# Patient Record
Sex: Female | Born: 1955
Health system: Southern US, Community
[De-identification: ages and names within clinical notes are randomized; demographics above are authoritative.]

## PROBLEM LIST (undated history)

## (undated) ENCOUNTER — Emergency Department (HOSPITAL_COMMUNITY): Admission: EM | Payer: Self-pay | Source: Home / Self Care

## (undated) DIAGNOSIS — B9689 Other specified bacterial agents as the cause of diseases classified elsewhere: Secondary | ICD-10-CM

## (undated) DIAGNOSIS — M1990 Unspecified osteoarthritis, unspecified site: Secondary | ICD-10-CM

## (undated) DIAGNOSIS — N760 Acute vaginitis: Secondary | ICD-10-CM

## (undated) DIAGNOSIS — I10 Essential (primary) hypertension: Secondary | ICD-10-CM

## (undated) HISTORY — DX: Other specified bacterial agents as the cause of diseases classified elsewhere: N76.0

## (undated) HISTORY — PX: PARTIAL HYSTERECTOMY: SHX80

## (undated) HISTORY — DX: Unspecified osteoarthritis, unspecified site: M19.90

## (undated) HISTORY — PX: BREAST EXCISIONAL BIOPSY: SUR124

## (undated) HISTORY — PX: ABDOMINAL HYSTERECTOMY: SHX81

## (undated) HISTORY — PX: POLYPECTOMY: SHX149

## (undated) HISTORY — DX: Other specified bacterial agents as the cause of diseases classified elsewhere: B96.89

---

## 1993-07-23 HISTORY — PX: ANTERIOR CRUCIATE LIGAMENT REPAIR: SHX115

## 1998-01-15 ENCOUNTER — Emergency Department (HOSPITAL_COMMUNITY): Admission: EM | Admit: 1998-01-15 | Discharge: 1998-01-15 | Payer: Self-pay | Admitting: Emergency Medicine

## 1998-02-25 ENCOUNTER — Encounter: Admission: RE | Admit: 1998-02-25 | Discharge: 1998-05-26 | Payer: Self-pay | Admitting: Orthopedic Surgery

## 1998-05-11 ENCOUNTER — Encounter: Admission: RE | Admit: 1998-05-11 | Discharge: 1998-08-09 | Payer: Self-pay | Admitting: Orthopedic Surgery

## 1998-06-14 ENCOUNTER — Encounter: Admission: RE | Admit: 1998-06-14 | Discharge: 1998-09-12 | Payer: Self-pay | Admitting: Orthopedic Surgery

## 1998-12-02 ENCOUNTER — Encounter: Admission: RE | Admit: 1998-12-02 | Discharge: 1998-12-02 | Payer: Self-pay | Admitting: Obstetrics & Gynecology

## 1998-12-02 ENCOUNTER — Other Ambulatory Visit: Admission: RE | Admit: 1998-12-02 | Discharge: 1998-12-02 | Payer: Self-pay | Admitting: Obstetrics & Gynecology

## 1998-12-26 ENCOUNTER — Ambulatory Visit (HOSPITAL_COMMUNITY): Admission: RE | Admit: 1998-12-26 | Discharge: 1998-12-26 | Payer: Self-pay | Admitting: Obstetrics & Gynecology

## 1999-02-07 ENCOUNTER — Ambulatory Visit (HOSPITAL_COMMUNITY): Admission: RE | Admit: 1999-02-07 | Discharge: 1999-02-07 | Payer: Self-pay | Admitting: *Deleted

## 1999-03-14 ENCOUNTER — Encounter: Admission: RE | Admit: 1999-03-14 | Discharge: 1999-03-14 | Payer: Self-pay | Admitting: Internal Medicine

## 1999-03-14 ENCOUNTER — Encounter: Admission: RE | Admit: 1999-03-14 | Discharge: 1999-03-14 | Payer: Self-pay | Admitting: Obstetrics & Gynecology

## 1999-03-17 ENCOUNTER — Ambulatory Visit (HOSPITAL_COMMUNITY): Admission: RE | Admit: 1999-03-17 | Discharge: 1999-03-17 | Payer: Self-pay | Admitting: *Deleted

## 1999-04-26 ENCOUNTER — Encounter: Admission: RE | Admit: 1999-04-26 | Discharge: 1999-04-26 | Payer: Self-pay | Admitting: Internal Medicine

## 2000-01-26 ENCOUNTER — Emergency Department (HOSPITAL_COMMUNITY): Admission: EM | Admit: 2000-01-26 | Discharge: 2000-01-26 | Payer: Self-pay | Admitting: Emergency Medicine

## 2004-04-24 ENCOUNTER — Emergency Department (HOSPITAL_COMMUNITY): Admission: EM | Admit: 2004-04-24 | Discharge: 2004-04-24 | Payer: Self-pay | Admitting: Plastic Surgery

## 2004-07-28 ENCOUNTER — Inpatient Hospital Stay (HOSPITAL_COMMUNITY): Admission: AD | Admit: 2004-07-28 | Discharge: 2004-07-28 | Payer: Self-pay | Admitting: Obstetrics & Gynecology

## 2004-07-31 ENCOUNTER — Encounter (INDEPENDENT_AMBULATORY_CARE_PROVIDER_SITE_OTHER): Payer: Self-pay | Admitting: Specialist

## 2004-07-31 ENCOUNTER — Observation Stay (HOSPITAL_COMMUNITY): Admission: AD | Admit: 2004-07-31 | Discharge: 2004-08-01 | Payer: Self-pay | Admitting: Obstetrics & Gynecology

## 2004-08-01 ENCOUNTER — Ambulatory Visit: Payer: Self-pay | Admitting: Obstetrics & Gynecology

## 2004-08-02 ENCOUNTER — Inpatient Hospital Stay (HOSPITAL_COMMUNITY): Admission: AD | Admit: 2004-08-02 | Discharge: 2004-08-02 | Payer: Self-pay | Admitting: Obstetrics and Gynecology

## 2004-08-15 ENCOUNTER — Ambulatory Visit: Payer: Self-pay | Admitting: Obstetrics & Gynecology

## 2004-08-22 ENCOUNTER — Ambulatory Visit: Payer: Self-pay | Admitting: Obstetrics & Gynecology

## 2004-08-22 ENCOUNTER — Inpatient Hospital Stay (HOSPITAL_COMMUNITY): Admission: AD | Admit: 2004-08-22 | Discharge: 2004-08-26 | Payer: Self-pay | Admitting: Obstetrics & Gynecology

## 2004-08-24 ENCOUNTER — Encounter (INDEPENDENT_AMBULATORY_CARE_PROVIDER_SITE_OTHER): Payer: Self-pay | Admitting: Specialist

## 2004-08-28 ENCOUNTER — Inpatient Hospital Stay (HOSPITAL_COMMUNITY): Admission: AD | Admit: 2004-08-28 | Discharge: 2004-08-28 | Payer: Self-pay | Admitting: Family Medicine

## 2004-09-12 ENCOUNTER — Ambulatory Visit: Payer: Self-pay | Admitting: Obstetrics and Gynecology

## 2004-09-26 ENCOUNTER — Ambulatory Visit: Payer: Self-pay | Admitting: Obstetrics & Gynecology

## 2004-10-03 ENCOUNTER — Ambulatory Visit (HOSPITAL_COMMUNITY): Admission: RE | Admit: 2004-10-03 | Discharge: 2004-10-03 | Payer: Self-pay | Admitting: Family Medicine

## 2004-10-17 ENCOUNTER — Encounter: Admission: RE | Admit: 2004-10-17 | Discharge: 2004-10-17 | Payer: Self-pay | Admitting: Family Medicine

## 2005-03-20 ENCOUNTER — Inpatient Hospital Stay (HOSPITAL_COMMUNITY): Admission: AD | Admit: 2005-03-20 | Discharge: 2005-03-20 | Payer: Self-pay | Admitting: *Deleted

## 2006-03-22 ENCOUNTER — Emergency Department (HOSPITAL_COMMUNITY): Admission: EM | Admit: 2006-03-22 | Discharge: 2006-03-22 | Payer: Self-pay | Admitting: *Deleted

## 2006-05-07 ENCOUNTER — Emergency Department (HOSPITAL_COMMUNITY): Admission: EM | Admit: 2006-05-07 | Discharge: 2006-05-07 | Payer: Self-pay | Admitting: Family Medicine

## 2006-12-02 ENCOUNTER — Emergency Department (HOSPITAL_COMMUNITY): Admission: EM | Admit: 2006-12-02 | Discharge: 2006-12-02 | Payer: Self-pay | Admitting: Emergency Medicine

## 2007-07-09 ENCOUNTER — Ambulatory Visit: Payer: Self-pay | Admitting: Internal Medicine

## 2007-09-02 ENCOUNTER — Ambulatory Visit: Payer: Self-pay | Admitting: Internal Medicine

## 2007-09-02 ENCOUNTER — Encounter: Payer: Self-pay | Admitting: Family Medicine

## 2007-09-02 LAB — CONVERTED CEMR LAB
ALT: 11 units/L (ref 0–35)
Albumin: 4.1 g/dL (ref 3.5–5.2)
Basophils Absolute: 0.1 10*3/uL (ref 0.0–0.1)
CO2: 24 meq/L (ref 19–32)
Calcium: 9 mg/dL (ref 8.4–10.5)
Chloride: 105 meq/L (ref 96–112)
Cholesterol: 144 mg/dL (ref 0–200)
Hemoglobin: 14.2 g/dL (ref 12.0–15.0)
Lymphocytes Relative: 27 % (ref 12–46)
Monocytes Absolute: 0.5 10*3/uL (ref 0.1–1.0)
Neutro Abs: 4.9 10*3/uL (ref 1.7–7.7)
Neutrophils Relative %: 60 % (ref 43–77)
Platelets: 228 10*3/uL (ref 150–400)
Potassium: 3.5 meq/L (ref 3.5–5.3)
RDW: 13.1 % (ref 11.5–15.5)
Sodium: 141 meq/L (ref 135–145)
Total Protein: 6.7 g/dL (ref 6.0–8.3)

## 2007-09-09 ENCOUNTER — Encounter: Admission: RE | Admit: 2007-09-09 | Discharge: 2007-09-09 | Payer: Self-pay | Admitting: Family Medicine

## 2007-09-23 ENCOUNTER — Encounter: Admission: RE | Admit: 2007-09-23 | Discharge: 2007-09-23 | Payer: Self-pay | Admitting: Family Medicine

## 2007-10-06 ENCOUNTER — Ambulatory Visit: Payer: Self-pay | Admitting: Internal Medicine

## 2007-10-08 ENCOUNTER — Ambulatory Visit: Payer: Self-pay | Admitting: *Deleted

## 2007-10-23 ENCOUNTER — Emergency Department (HOSPITAL_COMMUNITY): Admission: EM | Admit: 2007-10-23 | Discharge: 2007-10-23 | Payer: Self-pay | Admitting: Emergency Medicine

## 2007-11-07 ENCOUNTER — Ambulatory Visit (HOSPITAL_BASED_OUTPATIENT_CLINIC_OR_DEPARTMENT_OTHER): Admission: RE | Admit: 2007-11-07 | Discharge: 2007-11-07 | Payer: Self-pay | Admitting: Orthopedic Surgery

## 2007-11-20 ENCOUNTER — Encounter: Admission: RE | Admit: 2007-11-20 | Discharge: 2008-02-18 | Payer: Self-pay | Admitting: Orthopedic Surgery

## 2007-12-16 ENCOUNTER — Ambulatory Visit: Payer: Self-pay | Admitting: Internal Medicine

## 2008-01-01 ENCOUNTER — Ambulatory Visit: Payer: Self-pay | Admitting: Internal Medicine

## 2008-01-06 ENCOUNTER — Ambulatory Visit: Payer: Self-pay | Admitting: Internal Medicine

## 2008-05-25 ENCOUNTER — Ambulatory Visit: Payer: Self-pay | Admitting: Internal Medicine

## 2008-08-03 ENCOUNTER — Ambulatory Visit: Payer: Self-pay | Admitting: Internal Medicine

## 2008-09-10 ENCOUNTER — Encounter (INDEPENDENT_AMBULATORY_CARE_PROVIDER_SITE_OTHER): Payer: Self-pay | Admitting: Adult Health

## 2008-09-10 ENCOUNTER — Ambulatory Visit: Payer: Self-pay | Admitting: Internal Medicine

## 2008-09-10 LAB — CONVERTED CEMR LAB
ALT: 19 units/L (ref 0–35)
Albumin: 4.3 g/dL (ref 3.5–5.2)
BUN: 10 mg/dL (ref 6–23)
Basophils Absolute: 0 10*3/uL (ref 0.0–0.1)
CO2: 22 meq/L (ref 19–32)
Calcium: 9.1 mg/dL (ref 8.4–10.5)
Chlamydia, Swab/Urine, PCR: NEGATIVE
Chloride: 105 meq/L (ref 96–112)
Cholesterol: 169 mg/dL (ref 0–200)
Creatinine, Ser: 0.55 mg/dL (ref 0.40–1.20)
GC Probe Amp, Urine: NEGATIVE
Hemoglobin: 15 g/dL (ref 12.0–15.0)
Lymphocytes Relative: 25 % (ref 12–46)
Monocytes Absolute: 0.5 10*3/uL (ref 0.1–1.0)
Monocytes Relative: 6 % (ref 3–12)
Neutro Abs: 5.5 10*3/uL (ref 1.7–7.7)
Neutrophils Relative %: 67 % (ref 43–77)
Potassium: 3.6 meq/L (ref 3.5–5.3)
RBC: 5.2 M/uL — ABNORMAL HIGH (ref 3.87–5.11)
TSH: 0.593 microintl units/mL (ref 0.350–4.50)
Total CHOL/HDL Ratio: 3.7
Vit D, 25-Hydroxy: 53 ng/mL (ref 30–89)

## 2008-09-16 ENCOUNTER — Encounter (INDEPENDENT_AMBULATORY_CARE_PROVIDER_SITE_OTHER): Payer: Self-pay | Admitting: Adult Health

## 2008-09-16 LAB — CONVERTED CEMR LAB

## 2008-10-05 ENCOUNTER — Emergency Department (HOSPITAL_COMMUNITY): Admission: EM | Admit: 2008-10-05 | Discharge: 2008-10-05 | Payer: Self-pay | Admitting: Internal Medicine

## 2008-10-12 ENCOUNTER — Ambulatory Visit (HOSPITAL_COMMUNITY): Admission: RE | Admit: 2008-10-12 | Discharge: 2008-10-12 | Payer: Self-pay | Admitting: Internal Medicine

## 2008-11-09 ENCOUNTER — Ambulatory Visit: Payer: Self-pay | Admitting: Internal Medicine

## 2010-03-30 IMAGING — CR DG SHOULDER 2+V*L*
3 series · 3 of 3 positions shown · non-contrast
Comparison: None.

CLINICAL DATA: History given of shoulder pain for 1 month with no
history of trauma.

LEFT SHOULDER - 2+ VIEW

[w shoulder ap internal left]
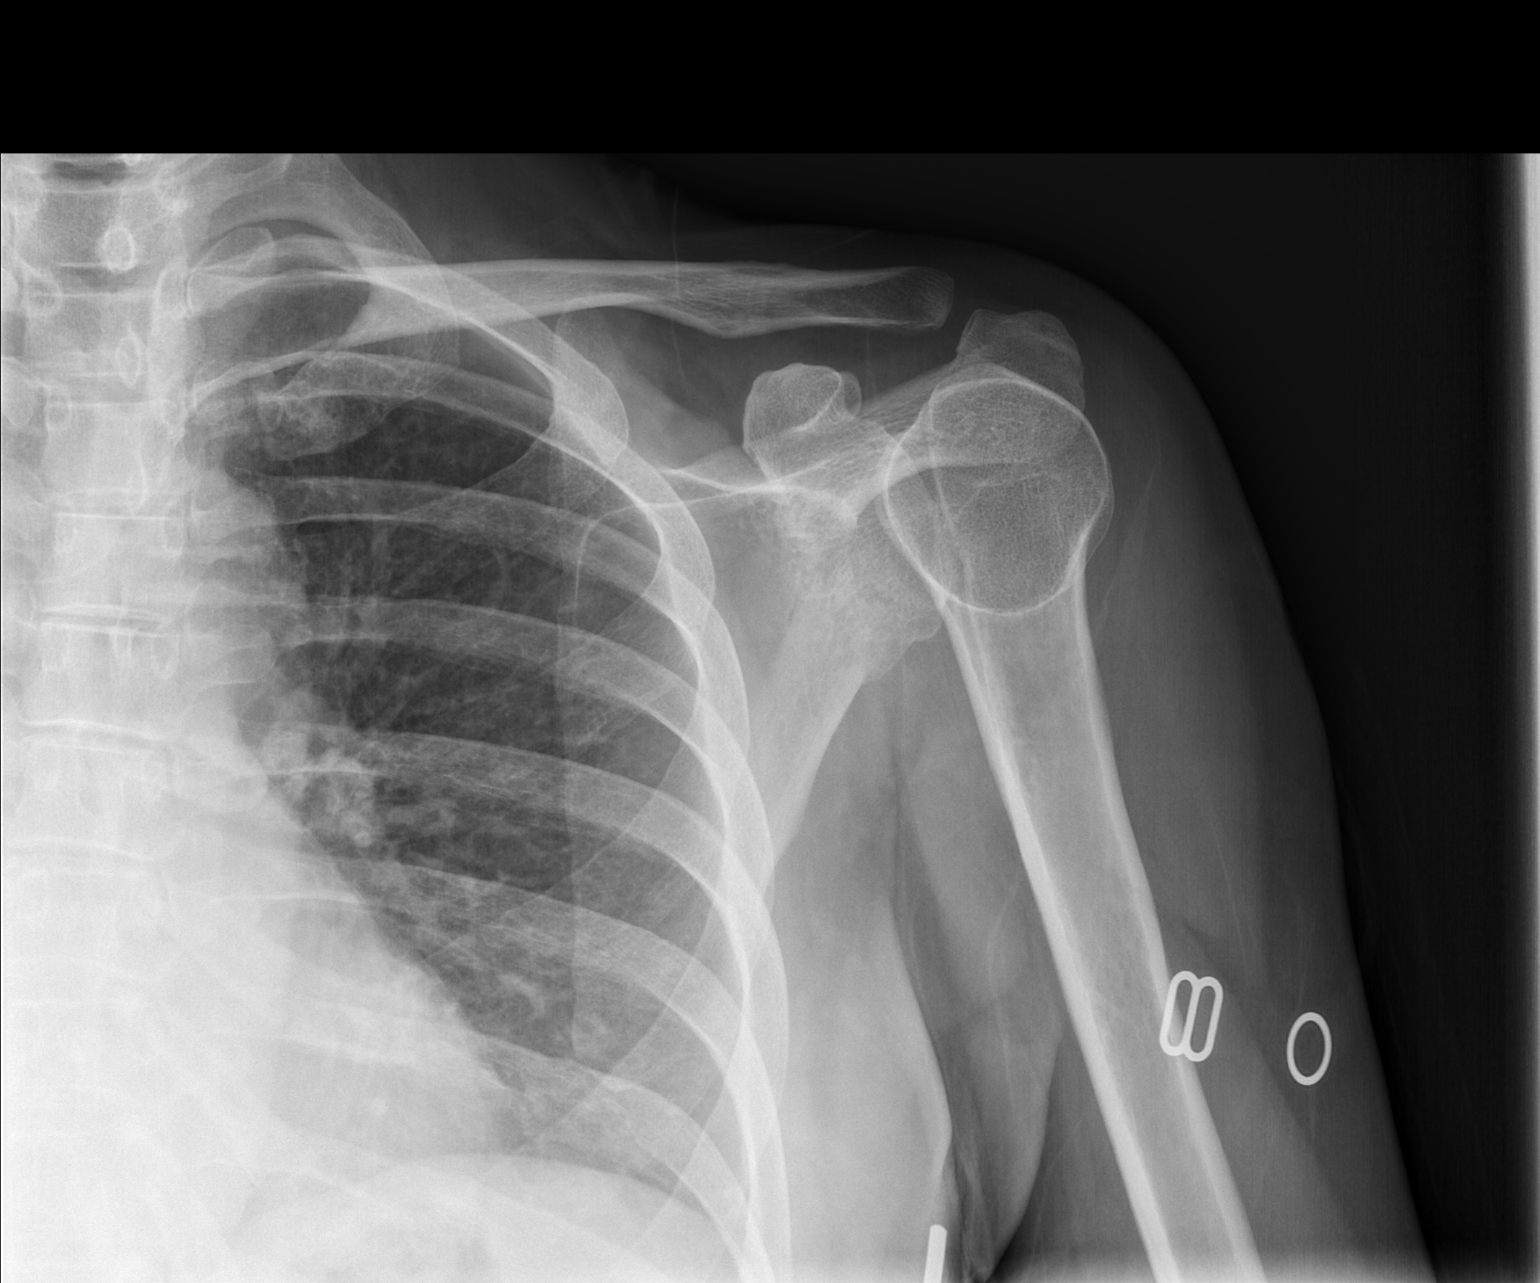

[w shoulder ap external left]
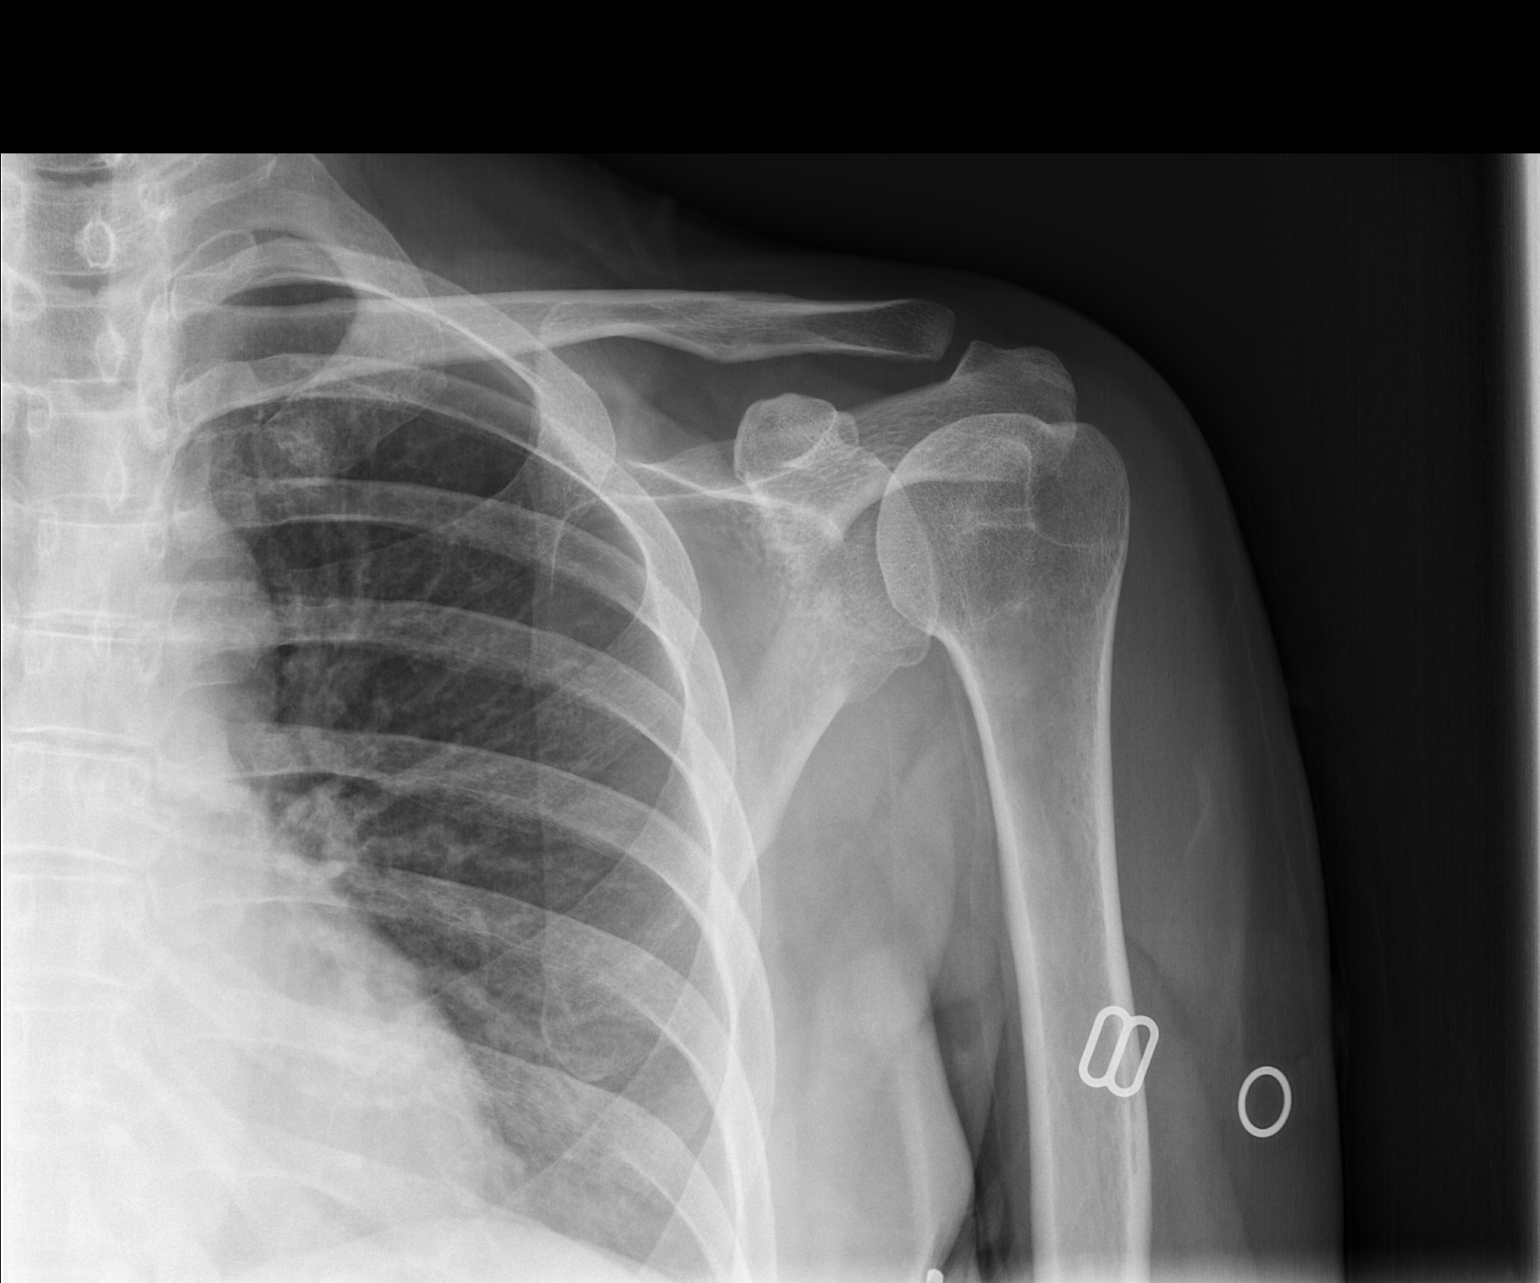

[w shoulder y view left]
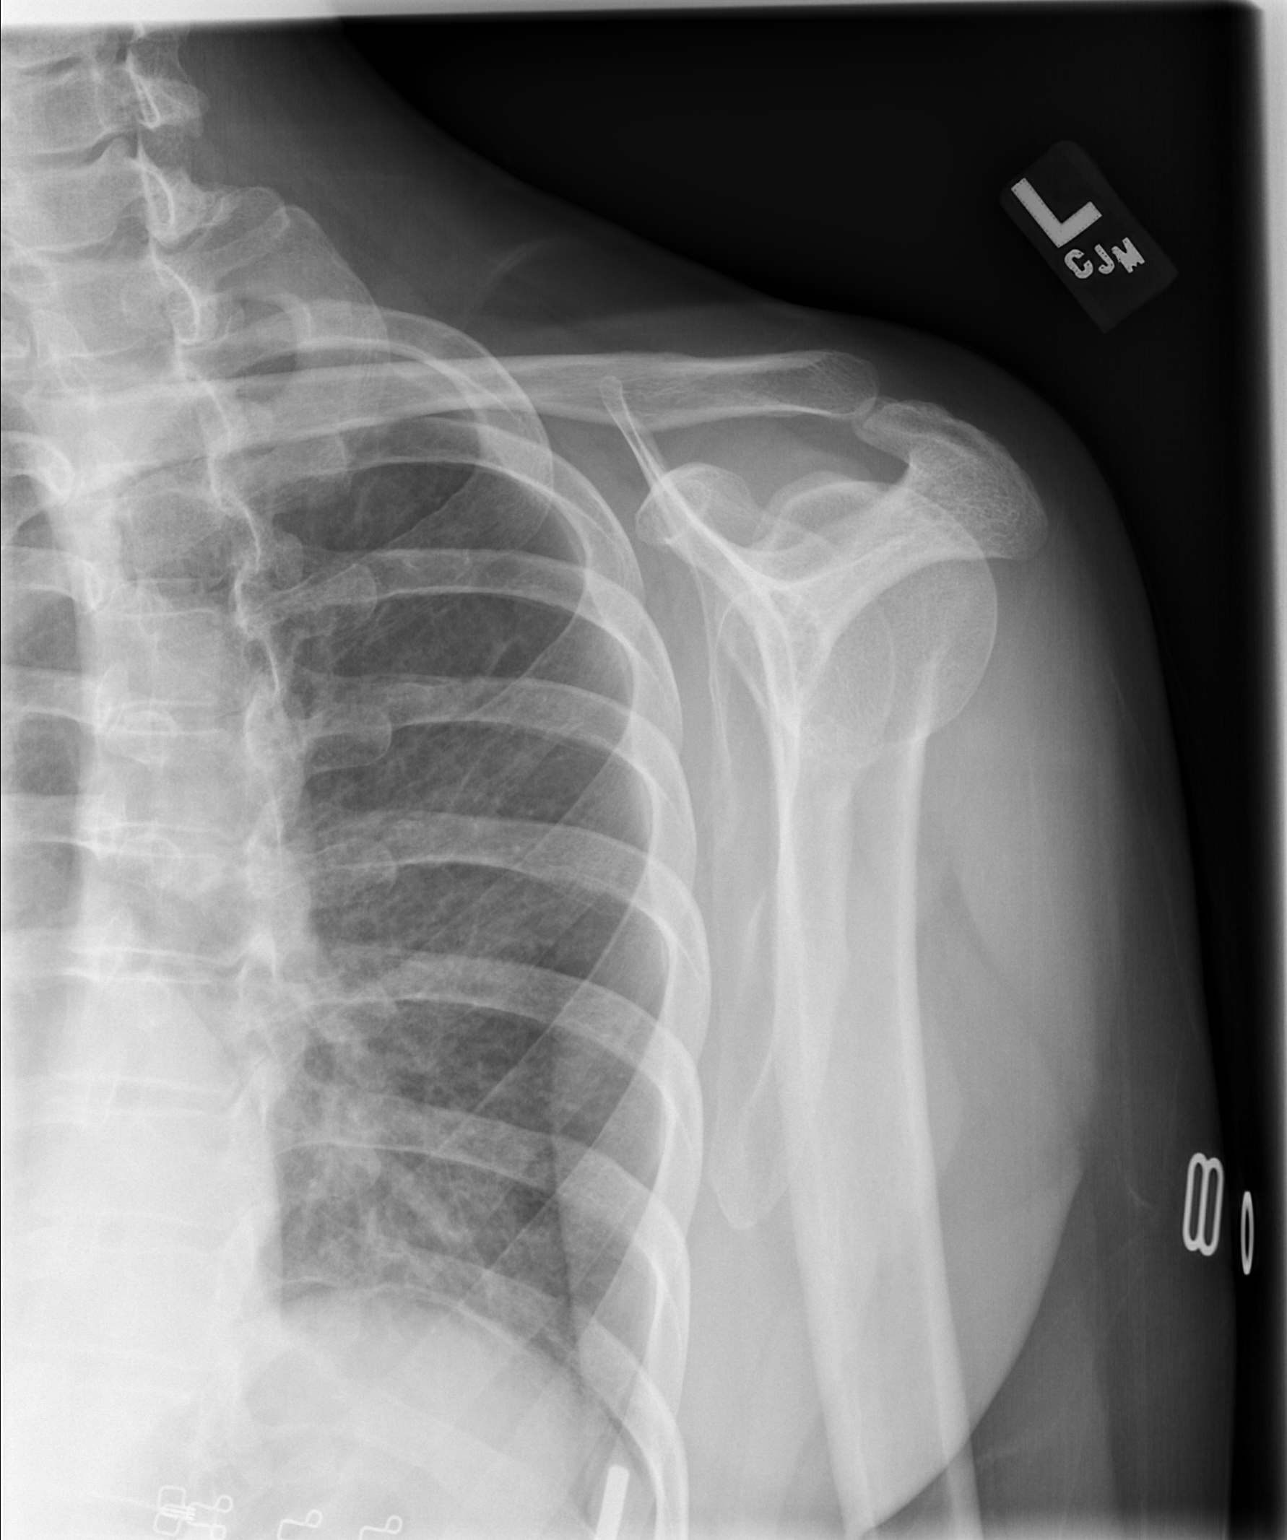

[3 of 3 positions shown; findings below may reference images not displayed]

FINDINGS: Alignment is normal.  Joint spaces are preserved.  No
fracture or dislocation is evident. No soft tissue lesion is seen.
IMPRESSION: No shoulder abnormality is identified.

## 2010-05-09 ENCOUNTER — Ambulatory Visit: Payer: Self-pay | Admitting: Internal Medicine

## 2010-05-23 ENCOUNTER — Ambulatory Visit (HOSPITAL_COMMUNITY): Admission: RE | Admit: 2010-05-23 | Discharge: 2010-05-23 | Payer: Self-pay | Admitting: Internal Medicine

## 2010-05-23 HISTORY — PX: COLONOSCOPY: SHX174

## 2010-06-06 ENCOUNTER — Encounter: Admission: RE | Admit: 2010-06-06 | Discharge: 2010-06-06 | Payer: Self-pay | Admitting: Internal Medicine

## 2010-12-05 NOTE — Op Note (Signed)
[name], [name]                ACCOUNT NO.:  [id number]   MEDICAL RECORD NO.:  [id number]          PATIENT TYPE:  AMB   LOCATION:  DSC                          FACILITY:  [facility]   PHYSICIAN:  Harvie Junior, M.D.   DATE OF BIRTH:  Jul 14, 1956   DATE OF PROCEDURE:  11/07/2007  DATE OF DISCHARGE:                               OPERATIVE REPORT   PREOPERATIVE DIAGNOSES:  Medial meniscal tear and anterior cruciate  ligament tear, right.   POSTOPERATIVE DIAGNOSES:  Medial meniscal tear and anterior cruciate  ligament tear, right.   PRINCIPAL PROCEDURE:  1. Right anterior cruciate ligament reconstruction with central one-      third patellar tendon allograft.  2. Posterior horn medial meniscal debridement of bucket-handle medial      meniscal tear.   SURGEON:  Harvie Junior, M.D.   ASSISTANT:  Marshia Ly, P.A.   ANESTHESIA:  General.   BRIEF HISTORY:  Shannon [name] is a 55 year old female with a long history  of having had significant right-knee pain and shifting and catching out  of place.  She most recently just had difficulty with falling with this  and, because of continued complaints of problems with instability and  pain, she came in to be evaluated.  Examination showed she had an  obvious pivot shift, Lachman and McMurray.  We talked about treatment  options, as well as we felt that ACL reconstruction would be the most  appropriate course of action, as well as debridement of the posterior  horn, medial meniscus.  She was brought to the operating room for this  procedure.   PROCEDURE:  Patient was brought to the operating room and, after  adequate anesthesia was obtained with a general anesthetic, patient was  placed upon the operating table.  Her right leg was prepped and draped  in the usual sterile fashion.  Following this, the leg was examined  under anesthesia and noted to have an obvious pivot shift, obvious  Lachman.  She was prepped and draped in the usual  sterile fashion and,  at this point, the routine arthroscopic examination revealed there was a  posteromedial bucket-handle tear.  This would come all the way out into  the knee.  This was released from the front, released from the back, and  a suction shaver was used to debride this back to a smooth and stable  rim.  Once this was completed, attention was turned to the notch, where  the old ACL was completely incompetent.  This was debrided and a  notchplasty was performed.  Lateral side was checked and noted to be  normal.  Patellofemoral joint looked normal at this point.   Attention was turned back to the notch, where after the notchplasty was  performed a tibial guide was used, placed through the medial portal, and  it was placed where it needed to be and a guide wire was advanced  through the tibia.  A small incision was made to dissect down to where  this guide wire went through the bone and, once this was completed, this  tunnel  was over-reamed to a 10.  The back portion of the tunnel was then  rasped and then the over-the-top guide was used and a tunnel was drilled  to 25 on the femoral side and a notch was placed in the tunnel.  Following this, the graft was placed in the knee, after all excessive  bone fragments had been removed from the knee.  Once the graft was  placed into the knee, it was locked in place with a 7 x 20 screw with a  sheath and the knee was then cycled 20 times.  There was excellent  isometry to the graft and tendency towards knee isometry.  At this  point, the graft was locked in on the tibial side with a 7 x 20 screw  with a sheath and, at this point, the leg came easily into full  extension with no impingement on the graft.  The knee was now Lachman-  negative and pivot-negative.   At this point, the knee was copiously and thoroughly irrigated and  suctioned dry.  The arthroscopic portals were closed with a bandage.  The distal portal was closed with a 0  and 2-0 Vicryl and a 3-0 Monocryl  subcuticular stitch.  Benzoin and Steri-Strips were applied.  Sterile  compressive dressing was applied and patient taken to recovery, where  she was noted to be in satisfactory condition.   Estimated blood loss for the procedure was none.      Harvie Junior, M.D.  Electronically Signed     JLG/MEDQ  D:  11/07/2007  T:  11/07/2007  Job:  [id number]

## 2010-12-08 NOTE — Discharge Summary (Signed)
[name], [name]                ACCOUNT NO.:  [id number]   MEDICAL RECORD NO.:  [id number]          PATIENT TYPE:  INP   LOCATION:  9305                          FACILITY:  [facility]   PHYSICIAN:  Lesly Dukes, M.D. DATE OF BIRTH:  03/05/1956   DATE OF ADMISSION:  07/31/2004  DATE OF DISCHARGE:  08/01/2004                                 DISCHARGE SUMMARY   ADMISSION DIAGNOSES:  44.  A 55 year old with menometrorrhagia.  2.  Anemia with a hemoglobin of 6.9.   DISCHARGE DIAGNOSES:  64.  A 55 year old with menometrorrhagia.  2.  Status post 2 units of packed red blood cells transfusion with an      appropriate rise in hemoglobin to 8.8.   DISCHARGE MEDICATIONS:  1.  Ovcon one p.o. daily.  2.  Iron sulfate 325 mg one p.o. b.i.d.  3.  Colace p.r.n.  4.  Ibuprofen 400-600 mg q.6h. p.r.n. cramping.   HISTORY OF PRESENT ILLNESS:  [name] was admitted with vaginal bleeding  and anemia.  She was admitted for a transfusion and stabilization.   HOSPITAL COURSE:  Her hospital course was fairly uncomplicated.  She  received 2 units of packed cells, with an appropriate rise in her hemoglobin  from 6.9 to 8.8.  She was given an injection of Lupron, 11.25 mg, prior to  discharge and was placed on iron and was told to continue her Ovcon.   CONDITION ON DISCHARGE:  The patient is discharged to home in stable  condition.   DISCHARGE INSTRUCTIONS:  The patient was told of her above medical regimen  and to follow up with the GYN clinic in three to four weeks.  She was to  return if the bleeding becomes more severe or if she becomes symptomatic  from her anemia.      LC/MEDQ  D:  08/01/2004  T:  08/01/2004  Job:  [id number]   cc:   Lesly Dukes, M.D.

## 2010-12-08 NOTE — Group Therapy Note (Signed)
NAMEIVIONA, HOLE                ACCOUNT NO.:  [id number]   MEDICAL RECORD NO.:  [id number]          PATIENT TYPE:  WOC   LOCATION:  [facility]                   FACILITY:  [facility]   PHYSICIAN:  Elsie Lincoln, MD      DATE OF BIRTH:  [birth year]   DATE OF SERVICE:  09/26/2004                                    CLINIC NOTE   Patient is a 55 year old female status post TAH on August 24, 2004.  Pathology showed a benign endometrial polyp with squamous metaplasia on the  cervix and adenomyosis.  The patient today has no complaints, a little bit  of tenderness around the incision.  She is not sexually active yet.  On the  21st of February, she was ruled out for bacterial vaginosis.  However, she  still feels that some type of discharge.  A wet prep was done today.  Results will be back tomorrow.  The patient has a small amount of  physiologic discharge.  Patient has had a mammogram, she does not know when  so we will set her up for a mammogram with a mammogram scholarship.  Patient  also has never had a history of dysplasia so she will no longer need Pap  smears.   PHYSICAL EXAMINATION:  ABDOMEN:  Soft, nontender, nondistended.  Incision  well healed.  PELVIC:  Genitalia Tanner V.  Vagina pink, normal rugae, small amount of  discharge.  Vaginal cuff well healed.  No evidence of granulation tissue or  infection.  On bimanual uterus is not present.  There are no masses and is  nontender.   ASSESSMENT/PLAN:  A 55 year old female status post total abdominal  hysterectomy approximately five weeks ago who is doing well.  1.  Wet prep for discharge.  2.  Mammogram ordered.  3.  Return to clinic in a year.      KL/MEDQ  D:  09/26/2004  T:  09/26/2004  Job:  [id number]

## 2010-12-08 NOTE — Discharge Summary (Signed)
Shannon [name], Shannon [name]                ACCOUNT NO.:  [id number]   MEDICAL RECORD NO.:  [id number]          PATIENT TYPE:  INP   LOCATION:  9318                          FACILITY[facility]   PHYSICIAN:  Lesly Dukes, M.D. DATE OF BIRTH:  06-05-1956   DATE OF ADMISSION:  08/22/2004  DATE OF DISCHARGE:  08/26/2004                                 DISCHARGE SUMMARY   The patient is a 55 year old female with a history of menorrhagia and  fibroids, who was admitted from gyn clinic on January 31.  The patient had a  continuous bleed despite receiving Depot-Lupron and an __________taper.  The  patient's current hemoglobin was 9.8.  Given that she has been unresponsive  to all medical interventions, the patient desires hysterectomy.  She was  taken for hysterectomy on February 2, had a total abdominal hysterectomy.  It was noted that her fallopian tubes were surgically absent.  Her ovaries  were normal, and her uterus was enlarged and boggy consistent with  adenomyosis.  EBL of the operation was 250 mL.  The patient did well  postoperatively.  She never had a fever, ambulated well, and tolerated solid  food, had flatus and voided easily.  She was discharged home with normal  postoperative instructions.  These include temps twice a day and is to call  for any temperature greater than or equal to 100.4 degrees F.  She is  supposed to do no heavy lifting and have vaginal rest for 6 weeks.   DISCHARGE MEDICATIONS:  1.  Ferrous sulfate 325 mg p.o. b.i.d.  2.  Colace 100 mg p.o. b.i.d.  3.  Vicodin 1-2 tabs p.o. q.4-6h. p.r.n. pain.   Follow up appointment February 6 for staple removal and an incision check at  my clinic on February 14.      KHL/MEDQ  D:  10/02/2004  T:  10/02/2004  Job:  [id number]

## 2010-12-08 NOTE — Op Note (Signed)
NAMESHILAH, HEFEL                ACCOUNT NO.:  [id number]   MEDICAL RECORD NO.:  [id number]          PATIENT TYPE:  INP   LOCATION:  9318                          FACILITY[facility]   PHYSICIAN:  Lesly Dukes, M.D. DATE OF BIRTH:  [birth year]   DATE OF PROCEDURE:  08/24/2004  DATE OF DISCHARGE:                                 OPERATIVE REPORT   PREOPERATIVE DIAGNOSIS:  A 55 year old female with menometrorrhagia  refractory to medications.   POSTOPERATIVE DIAGNOSIS:  A 55 year old female with menometrorrhagia  refractory to medications.   PROCEDURE:  Total abdominal hysterectomy.   SURGEON:  Lesly Dukes, M.D.   ASSISTANTMichele Mcalpine D. Rose, M.D.   ESTIMATED BLOOD LOSS:  250 mL.   ANESTHESIA:  General.   PATHOLOGY:  Uterus and cervix.   FINDINGS:  A slightly enlarged, boggy uterus.  Surgically absent fallopian  tubes.  Grossly normal small ovaries.  Grossly normal appendix, a moderate  amount of adhesions involving the bladder.   PROCEDURE:  After informed consent was obtained, the patient was taken to  the operating room, where general anesthesia was induced.  The patient was  placed in dorsal lithotomy position and prepared and draped in the normal  sterile fashion.  A Foley was placed in the bladder.  A Pfannenstiel skin  incision was made with a scalpel and carried down to the underlying layer of  fascia.  The fascia was incised in the midline and the incision extended  bilaterally with the Mayo scissors.  The superior and inferior aspect of the  fascial incision was grasped with Kocher clamps, tented up, dissected down  sharply and entered bluntly.  Underlying layers of the rectus muscles were  separated in the midline and the peritoneum identified and entered bluntly.  The incision was extended both superiorly and inferiorly with good  visualization of the bladder.  A survey of the abdominal contents was  performed.  A Balfour retractor was placed to the abdomen and  the uterus was  brought to the surgical field with [facility] on the cornua.  The round  ligaments were identified, clamped, transected and suture ligated with 0  Vicryl.  The utero-ovarian ligaments were then identified, doubly clamped,  transected and suture ligated with 3-0 Vicryl.  Good hemostasis was noted to  the pedicles.  The bladder flap was created using the Metzenbaum scissors.  The uterine arteries were then skeletonized, doubly clamped with curved  Heaneys, transected and suture ligated with 0 Vicryl.  Using a straight  Heaney, the uterosacral, cardinal ligaments and cervix were clamped,  transected and suture ligated with 0 Vicryl.  At the end of the cervix, the  vagina was entered and the Jorgenson scissors were used to transect the  vagina.  The specimen was then removed and sent off to pathology.  The  vaginal cuff was grabbed with Allises and the angles were sutured with 0  Vicryl.  Good hemostasis was noted.  The cuff was sutured with 0 Vicryl in a  running fashion and good hemostasis was noted as well.  The abdomen was  copiously irrigated and all pedicles were noted to be hemostatic.  The  peritoneum and rectus muscles were noted to be hemostatic.  The fascia was  then closed with 0 Vicryl in running fashion and noted to be hemostatic.  The subcutaneous tissue was copiously irrigated and found to  be hemostatic.  The skin was closed with staples and a dressing was placed  on the abdomen.  The patient tolerated the procedure well.  The sponge, lap,  needle and instrument count were correct x2.  The patient went to the  recovery room in stable condition.      KHL/MEDQ  D:  08/24/2004  T:  08/24/2004  Job:  [id number]

## 2010-12-08 NOTE — Group Therapy Note (Signed)
Shannon [name], Shannon [name]                ACCOUNT NO.:  [id number]   MEDICAL RECORD NO.:  [id number]          PATIENT TYPE:  WOC   LOCATION:  [facility]                   FACILITY:  [facility]   PHYSICIAN:  Elsie Lincoln, MD      DATE OF BIRTH:  Jun 11, 1956   DATE OF SERVICE:  08/15/2004                                    CLINIC NOTE   CHIEF COMPLAINT:  Follow-up menometrorrhagia and anemia.   SUBJECTIVE:  Shannon [name] is a 55 year old white female admitted earlier this  month with menometrorrhagia and anemia.  Her hemoglobin had dropped to 6.6  and she required blood transfusion.  She was given a Depo-Lupron shot at  that time and told to follow up today.  In addition, she had been taking  Ovcon two pills twice a day for a week and then down to one pill each day.  She continues on these.  In addition, she is taking iron three times a day.  She reports that her vaginal bleeding has decreased considerably but she is  still using two pads each day.  Over the weekend it was a little more  excessive at three pads daily.  She does report a little bit of  lightheadedness today as well.   OBJECTIVE:  VITAL SIGNS:  As noted in the chart.  Orthostatics were obtained  today.  Standing revealed a blood pressure 124/62, heart rate 64.  Sitting  revealed a blood pressure 122/60, heart rate of 60.  Supine revealed blood  pressure 124/70, heart rate of 56.  GENERAL:  Pleasant, alert, in no acute distress.   Detailed physical examination not done today.   ASSESSMENT/PLAN:  Menometrorrhagia with anemia.  Patient is now about two  weeks into the Lupron therapy.  She is down to about two pads each day.  Will continue her oral contraceptives for about one more week and then have  her discontinue these.  She will take her iron supplements three times each  day.  She will come back in about six weeks for reevaluation for  preoperative hysterectomy.  She is to follow up earlier if increasing  vaginal bleeding or  symptoms of anemia.      AK/MEDQ  D:  08/15/2004  T:  08/16/2004  Job:  [id number]

## 2010-12-08 NOTE — Discharge Summary (Signed)
NAMESHAKYA, Shannon [name]                ACCOUNT NO.:  [id number]   MEDICAL RECORD NO.:  [id number]          PATIENT TYPE:  INP   LOCATION:  9318                          [facility]ILITY[facility]   PHYSICIAN:  Lesly Dukes, M.D. DATE OF BIRTH:  1956/02/09   DATE OF ADMISSION:  08/22/2004  DATE OF DISCHARGE:  08/26/2004                                 DISCHARGE SUMMARY   HOSPITAL COURSE:  The patient is a 55 year old female who is admitted on  August 15, 2004 for a scheduled TAH secondary to menorrhagia and probable  adenomyosis. The patient had been admitted earlier in January and received 2  units of packed red blood cells and Lupron. This did not stop her bleeding  which is consistent with adenomyosis. She had a normal Pap smear and a  negative endometrial biopsy. The patient underwent abdominal hysterectomy  without complications.   Dictation ended at this point.      KHL/MEDQ  D:  10/02/2004  T:  10/02/2004  Job:  [id number]

## 2011-04-17 LAB — POCT HEMOGLOBIN-HEMACUE: Hemoglobin: 14.9

## 2011-12-17 ENCOUNTER — Ambulatory Visit: Payer: Self-pay | Admitting: Family Medicine

## 2011-12-17 VITALS — BP 148/100 | HR 67 | Temp 98.3°F | Resp 16 | Ht 64.5 in | Wt 159.4 lb

## 2011-12-17 DIAGNOSIS — H6090 Unspecified otitis externa, unspecified ear: Secondary | ICD-10-CM

## 2011-12-17 DIAGNOSIS — H9209 Otalgia, unspecified ear: Secondary | ICD-10-CM

## 2011-12-17 DIAGNOSIS — H60399 Other infective otitis externa, unspecified ear: Secondary | ICD-10-CM

## 2011-12-17 MED ORDER — CEPHALEXIN 500 MG PO CAPS: 500.0000 mg | ORAL_CAPSULE | Freq: Three times a day (TID) | ORAL | Status: AC

## 2011-12-17 MED ORDER — TRIAMCINOLONE ACETONIDE 0.1 % EX CREA: TOPICAL_CREAM | Freq: Two times a day (BID) | CUTANEOUS | Status: AC

## 2011-12-17 MED ORDER — NEOMYCIN-POLYMYXIN-HC 3.5-10000-1 OT SOLN: 3.0000 [drp] | Freq: Four times a day (QID) | OTIC | Status: AC

## 2011-12-17 NOTE — Progress Notes (Signed)
  Subjective:    Patient ID: Shannon [name], female    DOB: [birth year], 56 y.o.   MRN: [id number]  HPI 56 yo female with ear pain.  Describes trouble with itching and recurrent swimmers ear all her life.  This time for several days.  Left ear.  Pain, itching, redness, swelling.  Has tried hydrocortisone cream on the eczema but doesn't help much.    Review of Systems Negative except as per HPI     Objective:   Physical Exam  Constitutional: She appears well-developed.  HENT:       Right canal swollen and with white debris. Left ear - pinna red, swollen with flaky skin.  Canal swollen and with white debris.   Pulmonary/Chest: Effort normal.  Neurological: She is alert.          Assessment & Plan:  Otitis externa Eczema  Cortisporin otic, keflex, TAC cream for the eczema.

## 2014-09-20 ENCOUNTER — Ambulatory Visit: Payer: Self-pay | Attending: Internal Medicine

## 2014-10-11 ENCOUNTER — Encounter: Payer: Self-pay | Admitting: Internal Medicine

## 2014-10-11 ENCOUNTER — Ambulatory Visit (HOSPITAL_COMMUNITY)
Admission: RE | Admit: 2014-10-11 | Discharge: 2014-10-11 | Disposition: A | Discharge status: Home / Self Care | Payer: Self-pay | Source: Ambulatory Visit | Attending: Internal Medicine | Admitting: Internal Medicine

## 2014-10-11 ENCOUNTER — Ambulatory Visit: Payer: Self-pay | Attending: Internal Medicine | Admitting: Internal Medicine

## 2014-10-11 VITALS — BP 151/94 | HR 68 | Temp 98.0°F | Resp 16 | Wt 165.6 lb

## 2014-10-11 DIAGNOSIS — H6120 Impacted cerumen, unspecified ear: Secondary | ICD-10-CM | POA: Insufficient documentation

## 2014-10-11 DIAGNOSIS — M25561 Pain in right knee: Secondary | ICD-10-CM

## 2014-10-11 DIAGNOSIS — Z9181 History of falling: Secondary | ICD-10-CM | POA: Insufficient documentation

## 2014-10-11 DIAGNOSIS — I10 Essential (primary) hypertension: Secondary | ICD-10-CM | POA: Insufficient documentation

## 2014-10-11 DIAGNOSIS — H00016 Hordeolum externum left eye, unspecified eyelid: Secondary | ICD-10-CM | POA: Insufficient documentation

## 2014-10-11 DIAGNOSIS — G8929 Other chronic pain: Secondary | ICD-10-CM | POA: Insufficient documentation

## 2014-10-11 DIAGNOSIS — Z9889 Other specified postprocedural states: Secondary | ICD-10-CM

## 2014-10-11 DIAGNOSIS — M25569 Pain in unspecified knee: Secondary | ICD-10-CM

## 2014-10-11 DIAGNOSIS — M179 Osteoarthritis of knee, unspecified: Secondary | ICD-10-CM | POA: Insufficient documentation

## 2014-10-11 DIAGNOSIS — R030 Elevated blood-pressure reading, without diagnosis of hypertension: Secondary | ICD-10-CM

## 2014-10-11 DIAGNOSIS — IMO0001 Reserved for inherently not codable concepts without codable children: Secondary | ICD-10-CM

## 2014-10-11 DIAGNOSIS — H6092 Unspecified otitis externa, left ear: Secondary | ICD-10-CM | POA: Insufficient documentation

## 2014-10-11 DIAGNOSIS — L309 Dermatitis, unspecified: Secondary | ICD-10-CM | POA: Insufficient documentation

## 2014-10-11 DIAGNOSIS — Z139 Encounter for screening, unspecified: Secondary | ICD-10-CM | POA: Insufficient documentation

## 2014-10-11 DIAGNOSIS — Z1211 Encounter for screening for malignant neoplasm of colon: Secondary | ICD-10-CM

## 2014-10-11 LAB — COMPLETE METABOLIC PANEL WITH GFR
ALK PHOS: 96 U/L (ref 39–117)
ALT: 22 U/L (ref 0–35)
AST: 18 U/L (ref 0–37)
Albumin: 4.1 g/dL (ref 3.5–5.2)
BILIRUBIN TOTAL: 0.4 mg/dL (ref 0.2–1.2)
BUN: 11 mg/dL (ref 6–23)
CO2: 30 meq/L (ref 19–32)
CREATININE: 0.72 mg/dL (ref 0.50–1.10)
Calcium: 9.6 mg/dL (ref 8.4–10.5)
Chloride: 104 mEq/L (ref 96–112)
GFR, Est African American: >89 mL/min
GFR, Est Non African American: >89 mL/min
GLUCOSE: 90 mg/dL (ref 70–99)
POTASSIUM: 3.9 meq/L (ref 3.5–5.3)
SODIUM: 141 meq/L (ref 135–145)
Total Protein: 6.7 g/dL (ref 6.0–8.3)

## 2014-10-11 LAB — CBC WITH DIFFERENTIAL/PLATELET
BASOS PCT: 1 % (ref 0–1)
Basophils Absolute: 0.1 10*3/uL (ref 0.0–0.1)
EOS ABS: 0.2 10*3/uL (ref 0.0–0.7)
EOS PCT: 3 % (ref 0–5)
HCT: 40.6 % (ref 36.0–46.0)
Hemoglobin: 13.6 g/dL (ref 12.0–15.0)
Lymphocytes Relative: 38 % (ref 12–46)
Lymphs Abs: 2.5 10*3/uL (ref 0.7–4.0)
MCH: 28.6 pg (ref 26.0–34.0)
MCHC: 33.5 g/dL (ref 30.0–36.0)
MCV: 85.5 fL (ref 78.0–100.0)
MONO ABS: 0.5 10*3/uL (ref 0.1–1.0)
MONOS PCT: 7 % (ref 3–12)
MPV: 9.5 fL (ref 8.6–12.4)
Neutro Abs: 3.4 10*3/uL (ref 1.7–7.7)
Neutrophils Relative %: 51 % (ref 43–77)
PLATELETS: 213 10*3/uL (ref 150–400)
RBC: 4.75 MIL/uL (ref 3.87–5.11)
RDW: 13.8 % (ref 11.5–15.5)
WBC: 6.7 10*3/uL (ref 4.0–10.5)

## 2014-10-11 LAB — TSH: TSH: 0.547 u[IU]/mL (ref 0.350–4.500)

## 2014-10-11 MED ORDER — BACITRACIN-POLYMYXIN B 500-10000 UNIT/GM OP OINT: 1.0000 "application " | TOPICAL_OINTMENT | Freq: Two times a day (BID) | OPHTHALMIC | Status: DC

## 2014-10-11 MED ORDER — CIPROFLOXACIN-DEXAMETHASONE 0.3-0.1 % OT SUSP: 4.0000 [drp] | Freq: Two times a day (BID) | OTIC | Status: DC

## 2014-10-11 MED ORDER — CARBAMIDE PEROXIDE 6.5 % OT SOLN: 5.0000 [drp] | Freq: Two times a day (BID) | OTIC | Status: DC

## 2014-10-11 MED ORDER — HYDROCORTISONE 2.5 % EX CREA: TOPICAL_CREAM | Freq: Two times a day (BID) | CUTANEOUS | Status: DC

## 2014-10-11 NOTE — Progress Notes (Signed)
Patient here to establish care Patient states she injured her right knee years ago Had a fall this past year and injured the knee again and now having Pain Has itching to both ears that is so bad at times she make herself bleed Also has a sty to the top lid of her left eye

## 2014-10-11 NOTE — Progress Notes (Signed)
Patient Demographics  Shannon [name], is a 59 y.o. female  OJJ:[id number]  HBZ:[id number]  DOB - [birth year]  CC:  Chief Complaint  Patient presents with  . Establish Care       HPI: Shannon [name] is a 59 y.o. female here today to establish medical care.patient has history of chronic right knee pain, had a surgery done several years ago, patient reported few months she fell on ice and had some worsening symptoms, she takes OTC Aleve/ibuprofen when necessary for pain, patient denies any fever chills has been having problem with the use for several years, has been having left ear pain denies any discharge patient has been using Q-tip, she also reported to have eczema in the ears, was prescribed cream in the past she is also complaining of stye in her left eye. Denies any discharge. Patient has No headache, No chest pain, No abdominal pain - No Nausea, No new weakness tingling or numbness, No Cough - SOB.  No Known Allergies History reviewed. No pertinent past medical history. No current outpatient prescriptions on file prior to visit.   No current facility-administered medications on file prior to visit.   Family History  Problem Relation Age of Onset  . Cancer Father     colon cancer    History   Social History  . Marital Status: Single    Spouse Name: N/A  . Number of Children: N/A  . Years of Education: N/A   Occupational History  . Not on file.   Social History Main Topics  . Smoking status: Former Smoker -- 20 years    Quit date: 08/19/2011  . Smokeless tobacco: Not on file  . Alcohol Use: Not on file     Comment: socially   . Drug Use: Not on file  . Sexual Activity: Not on file   Other Topics Concern  . Not on file   Social History Narrative    Review of Systems: Constitutional: Negative for fever, chills, diaphoresis, activity change, appetite change and fatigue. HENT: Negative for ear pain, nosebleeds, congestion, facial swelling, rhinorrhea, neck  pain, neck stiffness and ear discharge.  Eyes: Negative for pain, discharge, redness, itching and visual disturbance. Respiratory: Negative for cough, choking, chest tightness, shortness of breath, wheezing and stridor.  Cardiovascular: Negative for chest pain, palpitations and leg swelling. Gastrointestinal: Negative for abdominal distention. Genitourinary: Negative for dysuria, urgency, frequency, hematuria, flank pain, decreased urine volume, difficulty urinating and dyspareunia.  Musculoskeletal: Negative for back pain, joint swelling, arthralgia and gait problem. Neurological: Negative for dizziness, tremors, seizures, syncope, facial asymmetry, speech difficulty, weakness, light-headedness, numbness and headaches.  Hematological: Negative for adenopathy. Does not bruise/bleed easily. Psychiatric/Behavioral: Negative for hallucinations, behavioral problems, confusion, dysphoric mood, decreased concentration and agitation.    Objective:   Filed Vitals:   10/11/14 1411  BP: 151/94  Pulse: 68  Temp: 98 F (36.7 C)  Resp: 16    Physical Exam: Constitutional: Patient appears well-developed and well-nourished. No distress. HENT: Normocephalic, atraumatic, External right and left ear normal. Oropharynx is clear and moist.increased wax in both ears, tenderness with palpation on left ear tragus . Eyes: Conjunctivae and EOM are normal. PERRLA, no scleral icterus. Neck: Normal ROM. Neck supple. No JVD. No tracheal deviation. No thyromegaly. CVS: RRR, S1/S2 +, no murmurs, no gallops, no carotid bruit.  Pulmonary: Effort and breath sounds normal, no stridor, rhonchi, wheezes, rales.  Abdominal: Soft. BS +, no distension, tenderness, rebound or guarding.  Musculoskeletal: Normal range of motion.  Right knee minimal swelling/tenderness Neuro: Alert. Normal reflexes, muscle tone coordination. No cranial nerve deficit. Skin: Skin is warm and dry. No rash noted. Not diaphoretic. No erythema. No  pallor. Psychiatric: Normal mood and affect. Behavior, judgment, thought content normal.  Lab Results  Component Value Date   WBC 8.2 09/10/2008   HGB 15.0 09/10/2008   HCT 46.3* 09/10/2008   MCV 89.0 09/10/2008   PLT 228 09/10/2008   Lab Results  Component Value Date   CREATININE 0.55 09/10/2008   BUN 10 09/10/2008   NA 140 09/10/2008   K 3.6 09/10/2008   CL 105 09/10/2008   CO2 22 09/10/2008    No results found for: HGBA1C Lipid Panel     Component Value Date/Time   CHOL 169 09/10/2008 2037   TRIG 68 09/10/2008 2037   HDL 46 09/10/2008 2037   CHOLHDL 3.7 Ratio 09/10/2008 2037   VLDL 14 09/10/2008 2037   LDLCALC 109* 09/10/2008 2037       Assessment and plan:   1. Chronic knee pain, right/. History of right knee surgery  - DG Knee Complete 4 Views Right; Future  2. Elevated BP Advise patient for DASH diet  3. Eczema  - hydrocortisone 2.5 % cream; Apply topically 2 (two) times daily.  Dispense: 30 g; Refill: 0  4. Otitis externa, left  - ciprofloxacin-dexamethasone (CIPRODEX) otic suspension; Place 4 drops into both ears 2 (two) times daily.  Dispense: 7.5 mL; Refill: 0  5. Stye, left Advised patient to apply warm compress - bacitracin-polymyxin b (POLYSPORIN) ophthalmic ointment; Place 1 application into both eyes every 12 (twelve) hours. apply to eye every 12 hours while awake  Dispense: 3.5 g; Refill: 0  7. Excess ear wax, unspecified laterality  - carbamide peroxide (DEBROX) 6.5 % otic solution; Place 5 drops into both ears 2 (two) times daily.  Dispense: 15 mL; Refill: 1  8. Screening Ordered baseline blood work  - CBC with Differential/Platelet - COMPLETE METABOLIC PANEL WITH GFR - TSH - Vit D  25 hydroxy (rtn osteoporosis monitoring) - Hemoglobin A1c  9. Special screening for malignant neoplasms, colon  - Ambulatory referral to Gastroenterology   Health Maintenance -Colonoscopy:referred to GI -Pap Smear: -Mammogram:Patient reported  that she had a mammogram done 2 months ago   Return in about 3 months (around 01/11/2015), or if symptoms worsen or fail to improve.    The patient was given clear instructions to go to ER or return to medical center if symptoms don't improve, worsen or new problems develop. The patient verbalized understanding. The patient was told to call to get lab results if they haven't heard anything in the next week.    This note has been created with Surveyor, quantity. Any transcriptional errors are unintentional.   Lorayne Marek, MD

## 2014-10-11 NOTE — Patient Instructions (Signed)
DASH Eating Plan °DASH stands for "Dietary Approaches to Stop Hypertension." The DASH eating plan is a healthy eating plan that has been shown to reduce high blood pressure (hypertension). Additional health benefits may include reducing the risk of type 2 diabetes mellitus, heart disease, and stroke. The DASH eating plan may also help with weight loss. °WHAT DO I NEED TO KNOW ABOUT THE DASH EATING PLAN? °For the DASH eating plan, you will follow these general guidelines: °· Choose foods with a percent daily value for sodium of less than 5% (as listed on the food label). °· Use salt-free seasonings or herbs instead of table salt or sea salt. °· Check with your health care provider or pharmacist before using salt substitutes. °· Eat lower-sodium products, often labeled as "lower sodium" or "no salt added." °· Eat fresh foods. °· Eat more vegetables, fruits, and low-fat dairy products. °· Choose whole grains. Look for the word "whole" as the first word in the ingredient list. °· Choose fish and skinless chicken or turkey more often than red meat. Limit fish, poultry, and meat to 6 oz (170 g) each day. °· Limit sweets, desserts, sugars, and sugary drinks. °· Choose heart-healthy fats. °· Limit cheese to 1 oz (28 g) per day. °· Eat more home-cooked food and less restaurant, buffet, and fast food. °· Limit fried foods. °· Cook foods using methods other than frying. °· Limit canned vegetables. If you do use them, rinse them well to decrease the sodium. °· When eating at a restaurant, ask that your food be prepared with less salt, or no salt if possible. °WHAT FOODS CAN I EAT? °Seek help from a dietitian for individual calorie needs. °Grains °Whole grain or whole wheat bread. Brown rice. Whole grain or whole wheat pasta. Quinoa, bulgur, and whole grain cereals. Low-sodium cereals. Corn or whole wheat flour tortillas. Whole grain cornbread. Whole grain crackers. Low-sodium crackers. °Vegetables °Fresh or frozen vegetables  (raw, steamed, roasted, or grilled). Low-sodium or reduced-sodium tomato and vegetable juices. Low-sodium or reduced-sodium tomato sauce and paste. Low-sodium or reduced-sodium canned vegetables.  °Fruits °All fresh, canned (in natural juice), or frozen fruits. °Meat and Other Protein Products °Ground beef (85% or leaner), grass-fed beef, or beef trimmed of fat. Skinless chicken or turkey. Ground chicken or turkey. Pork trimmed of fat. All fish and seafood. Eggs. Dried beans, peas, or lentils. Unsalted nuts and seeds. Unsalted canned beans. °Dairy °Low-fat dairy products, such as skim or 1% milk, 2% or reduced-fat cheeses, low-fat ricotta or cottage cheese, or plain low-fat yogurt. Low-sodium or reduced-sodium cheeses. °Fats and Oils °Tub margarines without trans fats. Light or reduced-fat mayonnaise and salad dressings (reduced sodium). Avocado. Safflower, olive, or canola oils. Natural peanut or almond butter. °Other °Unsalted popcorn and pretzels. °The items listed above may not be a complete list of recommended foods or beverages. Contact your dietitian for more options. °WHAT FOODS ARE NOT RECOMMENDED? °Grains °White bread. White pasta. White rice. Refined cornbread. Bagels and croissants. Crackers that contain trans fat. °Vegetables °Creamed or fried vegetables. Vegetables in a cheese sauce. Regular canned vegetables. Regular canned tomato sauce and paste. Regular tomato and vegetable juices. °Fruits °Dried fruits. Canned fruit in light or heavy syrup. Fruit juice. °Meat and Other Protein Products °Fatty cuts of meat. Ribs, chicken wings, bacon, sausage, bologna, salami, chitterlings, fatback, hot dogs, bratwurst, and packaged luncheon meats. Salted nuts and seeds. Canned beans with salt. °Dairy °Whole or 2% milk, cream, half-and-half, and cream cheese. Whole-fat or sweetened yogurt. Full-fat   cheeses or blue cheese. Nondairy creamers and whipped toppings. Processed cheese, cheese spreads, or cheese  curds. °Condiments °Onion and garlic salt, seasoned salt, table salt, and sea salt. Canned and packaged gravies. Worcestershire sauce. Tartar sauce. Barbecue sauce. Teriyaki sauce. Soy sauce, including reduced sodium. Steak sauce. Fish sauce. Oyster sauce. Cocktail sauce. Horseradish. Ketchup and mustard. Meat flavorings and tenderizers. Bouillon cubes. Hot sauce. Tabasco sauce. Marinades. Taco seasonings. Relishes. °Fats and Oils °Butter, stick margarine, lard, shortening, ghee, and bacon fat. Coconut, palm kernel, or palm oils. Regular salad dressings. °Other °Pickles and olives. Salted popcorn and pretzels. °The items listed above may not be a complete list of foods and beverages to avoid. Contact your dietitian for more information. °WHERE CAN I FIND MORE INFORMATION? °National Heart, [employer], and [employer] Institute: www.nhlbi.nih.gov/health/health-topics/topics/dash/ °Document Released: 06/28/2011 Document Revised: 11/23/2013 Document Reviewed: 05/13/2013 °ExitCare® Patient Information ©2015 [employer]. This information is not intended to replace advice given to you by your health care provider. Make sure you discuss any questions you have with your health care provider. ° °

## 2014-10-12 ENCOUNTER — Telehealth: Payer: Self-pay

## 2014-10-12 LAB — HEMOGLOBIN A1C
HEMOGLOBIN A1C: 5.3 % (ref <5.7)
Mean Plasma Glucose: 105 mg/dL (ref <117)

## 2014-10-12 LAB — VITAMIN D 25 HYDROXY (VIT D DEFICIENCY, FRACTURES): VIT D 25 HYDROXY: 25 ng/mL — AB (ref 30–100)

## 2014-10-12 MED ORDER — VITAMIN D (ERGOCALCIFEROL) 1.25 MG (50000 UNIT) PO CAPS: 50000.0000 [IU] | ORAL_CAPSULE | ORAL | Status: DC

## 2014-10-12 NOTE — Telephone Encounter (Signed)
-----   Message from Lorayne Marek, MD sent at 10/12/2014  9:29 AM EDT ----- Call and let the patient know that her knee x-ray reported  mild degenerative changes, no acute fracture or dislocation. IMPRESSION: There are mild degenerative changes centered in the medial joint compartment. No acute or healing fracture nor evidence of dislocation is demonstrated.

## 2014-10-12 NOTE — Telephone Encounter (Signed)
Patient is aware of her lab results and prescription Sent to [facility] [address]

## 2014-10-12 NOTE — Telephone Encounter (Signed)
Patient is aware of her x ray results 

## 2014-10-12 NOTE — Telephone Encounter (Signed)
-----   Message from Lorayne Marek, MD sent at 10/12/2014  9:18 AM EDT ----- Blood work reviewed, noticed low vitamin D, call patient advise to start ergocalciferol 50,000 units once a week for the duration of  12 weeks.

## 2015-01-10 ENCOUNTER — Encounter: Payer: Self-pay | Admitting: Internal Medicine

## 2015-01-10 ENCOUNTER — Ambulatory Visit: Payer: Self-pay | Attending: Internal Medicine | Admitting: Internal Medicine

## 2015-01-10 VITALS — BP 130/80 | HR 64 | Temp 98.0°F | Resp 16 | Wt 159.4 lb

## 2015-01-10 DIAGNOSIS — H6120 Impacted cerumen, unspecified ear: Secondary | ICD-10-CM | POA: Insufficient documentation

## 2015-01-10 DIAGNOSIS — Z87891 Personal history of nicotine dependence: Secondary | ICD-10-CM | POA: Insufficient documentation

## 2015-01-10 DIAGNOSIS — E559 Vitamin D deficiency, unspecified: Secondary | ICD-10-CM | POA: Insufficient documentation

## 2015-01-10 DIAGNOSIS — K219 Gastro-esophageal reflux disease without esophagitis: Secondary | ICD-10-CM | POA: Insufficient documentation

## 2015-01-10 DIAGNOSIS — H6063 Unspecified chronic otitis externa, bilateral: Secondary | ICD-10-CM | POA: Insufficient documentation

## 2015-01-10 DIAGNOSIS — Z79899 Other long term (current) drug therapy: Secondary | ICD-10-CM | POA: Insufficient documentation

## 2015-01-10 MED ORDER — OMEPRAZOLE 40 MG PO CPDR: 40.0000 mg | DELAYED_RELEASE_CAPSULE | Freq: Every day | ORAL | Status: DC

## 2015-01-10 NOTE — Patient Instructions (Signed)
DASH Eating Plan °DASH stands for "Dietary Approaches to Stop Hypertension." The DASH eating plan is a healthy eating plan that has been shown to reduce high blood pressure (hypertension). Additional health benefits may include reducing the risk of type 2 diabetes mellitus, heart disease, and stroke. The DASH eating plan may also help with weight loss. °WHAT DO I NEED TO KNOW ABOUT THE DASH EATING PLAN? °For the DASH eating plan, you will follow these general guidelines: °· Choose foods with a percent daily value for sodium of less than 5% (as listed on the food label). °· Use salt-free seasonings or herbs instead of table salt or sea salt. °· Check with your health care provider or pharmacist before using salt substitutes. °· Eat lower-sodium products, often labeled as "lower sodium" or "no salt added." °· Eat fresh foods. °· Eat more vegetables, fruits, and low-fat dairy products. °· Choose whole grains. Look for the word "whole" as the first word in the ingredient list. °· Choose fish and skinless chicken or turkey more often than red meat. Limit fish, poultry, and meat to 6 oz (170 g) each day. °· Limit sweets, desserts, sugars, and sugary drinks. °· Choose heart-healthy fats. °· Limit cheese to 1 oz (28 g) per day. °· Eat more home-cooked food and less restaurant, buffet, and fast food. °· Limit fried foods. °· Cook foods using methods other than frying. °· Limit canned vegetables. If you do use them, rinse them well to decrease the sodium. °· When eating at a restaurant, ask that your food be prepared with less salt, or no salt if possible. °WHAT FOODS CAN I EAT? °Seek help from a dietitian for individual calorie needs. °Grains °Whole grain or whole wheat bread. Brown rice. Whole grain or whole wheat pasta. Quinoa, bulgur, and whole grain cereals. Low-sodium cereals. Corn or whole wheat flour tortillas. Whole grain cornbread. Whole grain crackers. Low-sodium crackers. °Vegetables °Fresh or frozen vegetables  (raw, steamed, roasted, or grilled). Low-sodium or reduced-sodium tomato and vegetable juices. Low-sodium or reduced-sodium tomato sauce and paste. Low-sodium or reduced-sodium canned vegetables.  °Fruits °All fresh, canned (in natural juice), or frozen fruits. °Meat and Other Protein Products °Ground beef (85% or leaner), grass-fed beef, or beef trimmed of fat. Skinless chicken or turkey. Ground chicken or turkey. Pork trimmed of fat. All fish and seafood. Eggs. Dried beans, peas, or lentils. Unsalted nuts and seeds. Unsalted canned beans. °Dairy °Low-fat dairy products, such as skim or 1% milk, 2% or reduced-fat cheeses, low-fat ricotta or cottage cheese, or plain low-fat yogurt. Low-sodium or reduced-sodium cheeses. °Fats and Oils °Tub margarines without trans fats. Light or reduced-fat mayonnaise and salad dressings (reduced sodium). Avocado. Safflower, olive, or canola oils. Natural peanut or almond butter. °Other °Unsalted popcorn and pretzels. °The items listed above may not be a complete list of recommended foods or beverages. Contact your dietitian for more options. °WHAT FOODS ARE NOT RECOMMENDED? °Grains °White bread. White pasta. White rice. Refined cornbread. Bagels and croissants. Crackers that contain trans fat. °Vegetables °Creamed or fried vegetables. Vegetables in a cheese sauce. Regular canned vegetables. Regular canned tomato sauce and paste. Regular tomato and vegetable juices. °Fruits °Dried fruits. Canned fruit in light or heavy syrup. Fruit juice. °Meat and Other Protein Products °Fatty cuts of meat. Ribs, chicken wings, bacon, sausage, bologna, salami, chitterlings, fatback, hot dogs, bratwurst, and packaged luncheon meats. Salted nuts and seeds. Canned beans with salt. °Dairy °Whole or 2% milk, cream, half-and-half, and cream cheese. Whole-fat or sweetened yogurt. Full-fat   cheeses or blue cheese. Nondairy creamers and whipped toppings. Processed cheese, cheese spreads, or cheese  curds. °Condiments °Onion and garlic salt, seasoned salt, table salt, and sea salt. Canned and packaged gravies. Worcestershire sauce. Tartar sauce. Barbecue sauce. Teriyaki sauce. Soy sauce, including reduced sodium. Steak sauce. Fish sauce. Oyster sauce. Cocktail sauce. Horseradish. Ketchup and mustard. Meat flavorings and tenderizers. Bouillon cubes. Hot sauce. Tabasco sauce. Marinades. Taco seasonings. Relishes. °Fats and Oils °Butter, stick margarine, lard, shortening, ghee, and bacon fat. Coconut, palm kernel, or palm oils. Regular salad dressings. °Other °Pickles and olives. Salted popcorn and pretzels. °The items listed above may not be a complete list of foods and beverages to avoid. Contact your dietitian for more information. °WHERE CAN I FIND MORE INFORMATION? °National Heart, [employer], and [employer] Institute: www.nhlbi.nih.gov/health/health-topics/topics/dash/ °Document Released: 06/28/2011 Document Revised: 11/23/2013 Document Reviewed: 05/13/2013 °ExitCare® Patient Information ©2015 [employer]. This information is not intended to replace advice given to you by your health care provider. Make sure you discuss any questions you have with your health care provider. ° °

## 2015-01-10 NOTE — Progress Notes (Signed)
Patient here for follow up and to discuss having her colonoscopy and endoscopy Patient is not sure if she is too early to have this done

## 2015-01-10 NOTE — Progress Notes (Signed)
MRN: [id number] Name: Shannon [name]  Sex: female Age: 59 y.o. DOB: [birth year]  Allergies: Review of patient's allergies indicates no known allergies.  Chief Complaint  Patient presents with  . Follow-up    HPI: Patient is 59 y.o. female who comes today for followup, she was treated for otitis externa, she denies any fever chills, she has already finished the eardrops, she does report chronic ear problems and has not seen an ENT Dr. Patient is also complaining of reflux symptoms, denies any nausea vomiting, she has already been referred to GI and is due for colonoscopy in November, recent blood work reviewed patient noticed vitamin D deficiency which as per patient she has finished taking 50,000 units once a week dose, she'll get to start taking vitamin D thousand units every day.  History reviewed. No pertinent past medical history.  History reviewed. No pertinent past surgical history.    Medication List       This list is accurate as of: 01/10/15  5:13 PM.  Always use your most recent med list.               bacitracin-polymyxin b ophthalmic ointment  Commonly known as:  POLYSPORIN  Place 1 application into both eyes every 12 (twelve) hours. apply to eye every 12 hours while awake     carbamide peroxide 6.5 % otic solution  Commonly known as:  DEBROX  Place 5 drops into both ears 2 (two) times daily.     ciprofloxacin-dexamethasone otic suspension  Commonly known as:  CIPRODEX  Place 4 drops into both ears 2 (two) times daily.     hydrocortisone 2.5 % cream  Apply topically 2 (two) times daily.     omeprazole 40 MG capsule  Commonly known as:  PRILOSEC  Take 1 capsule (40 mg total) by mouth daily.     Vitamin D (Ergocalciferol) 50000 UNITS Caps capsule  Commonly known as:  DRISDOL  Take 1 capsule (50,000 Units total) by mouth every 7 (seven) days.        Meds ordered this encounter  Medications  . omeprazole (PRILOSEC) 40 MG capsule    Sig: Take 1  capsule (40 mg total) by mouth daily.    Dispense:  30 capsule    Refill:  3     There is no immunization history on file for this patient.  Family History  Problem Relation Age of Onset  . Cancer Father     colon cancer     History  Substance Use Topics  . Smoking status: Former Smoker -- 20 years    Quit date: 08/19/2011  . Smokeless tobacco: Not on file  . Alcohol Use: Not on file     Comment: socially     Review of Systems   As noted in HPI  Filed Vitals:   01/10/15 1643  BP: 130/80  Pulse:   Temp:   Resp:     Physical Exam  Physical Exam  HENT:  Bilateral ear wax/? chronic inflammatory/ eczmaous changes, cannot visualize tympanic membrane  Eyes: EOM are normal. Pupils are equal, round, and reactive to light.  Neck: Neck supple.  Cardiovascular: Normal rate and regular rhythm.   Pulmonary/Chest: Breath sounds normal. No respiratory distress. She has no wheezes. She has no rales.  Abdominal: Soft. There is no tenderness. There is no rebound.  Musculoskeletal: She exhibits no edema.    CBC    Component Value Date/Time   WBC 6.7 10/11/2014 1447  RBC 4.75 10/11/2014 1447   HGB 13.6 10/11/2014 1447   HCT 40.6 10/11/2014 1447   PLT 213 10/11/2014 1447   MCV 85.5 10/11/2014 1447   LYMPHSABS 2.5 10/11/2014 1447   MONOABS 0.5 10/11/2014 1447   EOSABS 0.2 10/11/2014 1447   BASOSABS 0.1 10/11/2014 1447    CMP     Component Value Date/Time   NA 141 10/11/2014 1447   K 3.9 10/11/2014 1447   CL 104 10/11/2014 1447   CO2 30 10/11/2014 1447   GLUCOSE 90 10/11/2014 1447   BUN 11 10/11/2014 1447   CREATININE 0.72 10/11/2014 1447   CREATININE 0.55 09/10/2008 2037   CALCIUM 9.6 10/11/2014 1447   PROT 6.7 10/11/2014 1447   ALBUMIN 4.1 10/11/2014 1447   AST 18 10/11/2014 1447   ALT 22 10/11/2014 1447   ALKPHOS 96 10/11/2014 1447   BILITOT 0.4 10/11/2014 1447   GFRNONAA >89 10/11/2014 1447   GFRAA >89 10/11/2014 1447    Lab Results  Component Value  Date/Time   CHOL 169 09/10/2008 08:37 PM    Lab Results  Component Value Date/Time   HGBA1C 5.3 10/11/2014 02:47 PM    Lab Results  Component Value Date/Time   AST 18 10/11/2014 02:47 PM    Assessment and Plan  Gastroesophageal reflux disease, esophagitis presence not specified - Plan: lifestyle modification, trial of omeprazole (PRILOSEC) 40 MG capsule  Chronic otitis externa of both ears /Excess ear wax, unspecified laterality- Plan: patient has been treated with eardrops Ambulatory referral to ENT  Vitamin D deficiency Patient start taking over-the-counter vitamin D 2000 units daily   Return in about 3 months (around 04/12/2015), or if symptoms worsen or fail to improve.   This note has been created with Surveyor, quantity. Any transcriptional errors are unintentional.    Lorayne Marek, MD

## 2015-02-24 ENCOUNTER — Ambulatory Visit (INDEPENDENT_AMBULATORY_CARE_PROVIDER_SITE_OTHER): Payer: Self-pay | Admitting: Otolaryngology

## 2015-02-24 DIAGNOSIS — H6123 Impacted cerumen, bilateral: Secondary | ICD-10-CM

## 2015-02-24 DIAGNOSIS — H608X3 Other otitis externa, bilateral: Secondary | ICD-10-CM

## 2015-03-25 ENCOUNTER — Ambulatory Visit: Payer: Self-pay | Attending: Internal Medicine

## 2015-03-31 ENCOUNTER — Ambulatory Visit (INDEPENDENT_AMBULATORY_CARE_PROVIDER_SITE_OTHER): Payer: Self-pay | Admitting: Otolaryngology

## 2015-03-31 DIAGNOSIS — H608X3 Other otitis externa, bilateral: Secondary | ICD-10-CM

## 2015-03-31 DIAGNOSIS — H6123 Impacted cerumen, bilateral: Secondary | ICD-10-CM

## 2015-04-04 ENCOUNTER — Ambulatory Visit: Payer: Self-pay | Attending: Internal Medicine

## 2015-04-14 ENCOUNTER — Telehealth: Payer: Self-pay | Admitting: Internal Medicine

## 2015-05-09 ENCOUNTER — Other Ambulatory Visit: Payer: Self-pay

## 2015-05-09 DIAGNOSIS — Z1231 Encounter for screening mammogram for malignant neoplasm of breast: Secondary | ICD-10-CM

## 2015-05-23 ENCOUNTER — Ambulatory Visit: Payer: Self-pay

## 2015-05-27 ENCOUNTER — Encounter: Payer: Self-pay | Admitting: Gastroenterology

## 2015-06-13 ENCOUNTER — Ambulatory Visit
Admission: RE | Admit: 2015-06-13 | Discharge: 2015-06-13 | Disposition: A | Discharge status: Home / Self Care | Payer: No Typology Code available for payment source | Source: Ambulatory Visit

## 2015-06-13 DIAGNOSIS — Z1231 Encounter for screening mammogram for malignant neoplasm of breast: Secondary | ICD-10-CM

## 2015-06-22 ENCOUNTER — Encounter: Payer: Self-pay | Admitting: Gastroenterology

## 2015-08-22 ENCOUNTER — Ambulatory Visit (AMBULATORY_SURGERY_CENTER): Payer: Self-pay | Admitting: *Deleted

## 2015-08-22 VITALS — Ht 65.0 in | Wt 159.0 lb

## 2015-08-22 DIAGNOSIS — Z800 Family history of malignant neoplasm of digestive organs: Secondary | ICD-10-CM

## 2015-08-22 MED ORDER — BISACODYL EC 5 MG PO TBEC: DELAYED_RELEASE_TABLET | ORAL | Status: DC

## 2015-08-22 MED ORDER — POLYETHYLENE GLYCOL 3350 17 GM/SCOOP PO POWD
ORAL | Status: DC
Start: 2015-08-22 — End: 2015-09-05

## 2015-08-22 MED FILL — POLYETHYLENE GLYCOL 3350: 1 days supply | Qty: 255 | Fill #0

## 2015-08-22 NOTE — Progress Notes (Signed)
Patient denies any allergies to eggs or soy. Patient denies any problems with anesthesia/sedation. Patient denies any oxygen use at home and does not take any diet/weight loss medications. EMMI education assisgned to patient on colonoscopy, this was explained and instructions given to patient. 

## 2015-09-05 ENCOUNTER — Ambulatory Visit (AMBULATORY_SURGERY_CENTER): Payer: No Typology Code available for payment source | Admitting: Gastroenterology

## 2015-09-05 ENCOUNTER — Encounter: Payer: Self-pay | Admitting: Gastroenterology

## 2015-09-05 VITALS — BP 112/72 | HR 48 | Temp 96.2°F | Resp 25 | Ht 65.0 in | Wt 159.0 lb

## 2015-09-05 DIAGNOSIS — D124 Benign neoplasm of descending colon: Secondary | ICD-10-CM

## 2015-09-05 DIAGNOSIS — Z1211 Encounter for screening for malignant neoplasm of colon: Secondary | ICD-10-CM

## 2015-09-05 DIAGNOSIS — D122 Benign neoplasm of ascending colon: Secondary | ICD-10-CM

## 2015-09-05 DIAGNOSIS — Z800 Family history of malignant neoplasm of digestive organs: Secondary | ICD-10-CM

## 2015-09-05 HISTORY — PX: COLONOSCOPY: SHX174

## 2015-09-05 MED ORDER — SODIUM CHLORIDE 0.9 % IV SOLN: 500.0000 mL | INTRAVENOUS | Status: DC

## 2015-09-05 NOTE — Patient Instructions (Signed)
YOU HAD AN ENDOSCOPIC PROCEDURE TODAY AT [facility]:   Refer to the procedure report that was given to you for any specific questions about what was found during the examination.  If the procedure report does not answer your questions, please call your gastroenterologist to clarify.  If you requested that your care partner not be given the details of your procedure findings, then the procedure report has been included in a sealed envelope for you to review at your convenience later.  YOU SHOULD EXPECT: Some feelings of bloating in the abdomen. Passage of more gas than usual.  Walking can help get rid of the air that was put into your GI tract during the procedure and reduce the bloating. If you had a lower endoscopy (such as a colonoscopy or flexible sigmoidoscopy) you may notice spotting of blood in your stool or on the toilet paper. If you underwent a bowel prep for your procedure, you may not have a normal bowel movement for a few days.  Please Note:  You might notice some irritation and congestion in your nose or some drainage.  This is from the oxygen used during your procedure.  There is no need for concern and it should clear up in a day or so.  SYMPTOMS TO REPORT IMMEDIATELY:   Following lower endoscopy (colonoscopy or flexible sigmoidoscopy):  Excessive amounts of blood in the stool  Significant tenderness or worsening of abdominal pains  Swelling of the abdomen that is new, acute  Fever of 100F or higher   For urgent or emergent issues, a gastroenterologist can be reached at any hour by calling [phone].   DIET: Your first meal following the procedure should be a small meal and then it is ok to progress to your normal diet. Heavy or fried foods are harder to digest and may make you feel nauseous or bloated.  Likewise, meals heavy in dairy and vegetables can increase bloating.  Drink plenty of fluids but you should avoid alcoholic beverages for 24  hours.  ACTIVITY:  You should plan to take it easy for the rest of today and you should NOT DRIVE or use heavy machinery until tomorrow (because of the sedation medicines used during the test).    FOLLOW UP: Our staff will call the number listed on your records the next business day following your procedure to check on you and address any questions or concerns that you may have regarding the information given to you following your procedure. If we do not reach you, we will leave a message.  However, if you are feeling well and you are not experiencing any problems, there is no need to return our call.  We will assume that you have returned to your regular daily activities without incident.  If any biopsies were taken you will be contacted by phone or by letter within the next 1-3 weeks.  Please call us at [phone] if you have not heard about the biopsies in 3 weeks.    SIGNATURES/CONFIDENTIALITY: You and/or your care partner have signed paperwork which will be entered into your electronic medical record.  These signatures attest to the fact that that the information above on your After Visit Summary has been reviewed and is understood.  Full responsibility of the confidentiality of this discharge information lies with you and/or your care-partner.  Thank-you for choosing [place] for your medical needs today.  Read all of the handouts given to by your recovery room nurse.

## 2015-09-05 NOTE — Op Note (Signed)
[place]  [address] [place], [id number]   COLONOSCOPY PROCEDURE REPORT  PATIENT: Shannon [name], Shannon [name]  MR#: [id number] BIRTHDATE: 09/03/1955 , 65  yrs. old GENDER: female ENDOSCOPIST: Milus Banister, MD REFERRED [id number] Advani, MD PROCEDURE DATE:  09/05/2015 PROCEDURE:   Colonoscopy, screening and Colonoscopy with snare polypectomy First Screening Colonoscopy - Avg.  risk and is 50 yrs.  old or older - No.  Prior Negative Screening - Now for repeat screening. Above average risk  History of Adenoma - Now for follow-up colonoscopy & has been > or = to 3 yrs.  N/A  Polyps removed today? Yes ASA CLASS:   Class II INDICATIONS:Screening for colonic neoplasia, FH Colon or Rectal Adenocarcinoma, and Colonoscopy 2011 Dr.  Alice Reichert was normal; her father died of colon cancer in his early 107s. MEDICATIONS: Monitored anesthesia care and Propofol 250 mg IV  DESCRIPTION OF PROCEDURE:   After the risks benefits and alternatives of the procedure were thoroughly explained, informed consent was obtained.  The digital rectal exam revealed no abnormalities of the rectum.   The [id number]  endoscope was introduced through the anus and advanced to the cecum, which was identified by both the appendix and ileocecal valve. No adverse events experienced.   The quality of the prep was excellent.  The instrument was then slowly withdrawn as the colon was fully examined. Estimated blood loss is zero unless otherwise noted in this procedure report.   COLON FINDINGS: Two sessile polyps ranging between 3-77mm in size were found in the descending colon and ascending colon. Polypectomies were performed with a cold snare.  The resection was complete, the polyp tissue was completely retrieved and sent to histology.   The examination was otherwise normal.  Retroflexed views revealed no abnormalities. The time to cecum = 2.5 Withdrawal time = 7.2   The scope was withdrawn and the  procedure completed. COMPLICATIONS: There were no immediate complications.  ENDOSCOPIC IMPRESSION: 1. Two sessile polyps ranging between 3-64mm in size were found in the descending colon and ascending colon; polypectomies were performed with a cold snare 2.   The examination was otherwise normal  RECOMMENDATIONS: Given your significant family history of colon cancer (father), you should have a repeat colonoscopy in 5 years even if the polyps removed today are NOT pre-cancerous.  You will receive a letter within 1-2 weeks with the results of your biopsy as well as final recommendations.  Please call my office if you have not received a letter after 3 weeks.  eSigned:  Milus Banister, MD 09/05/2015 10:40 AM

## 2015-09-05 NOTE — Progress Notes (Signed)
Called to room to assist during endoscopic procedure.  Patient ID and intended procedure confirmed with present staff. Received instructions for my participation in the procedure from the performing physician.  

## 2015-09-05 NOTE — Progress Notes (Signed)
To PACU PT awake and alert. Report to RN 

## 2015-09-06 ENCOUNTER — Telehealth: Payer: Self-pay

## 2015-09-06 NOTE — Telephone Encounter (Signed)
  Follow up Call-  Call back number 09/05/2015  Post procedure Call Back phone  # [phone]  Permission to leave phone message Yes     Patient questions:  Do you have a fever, pain , or abdominal swelling? No. Pain Score  0 *  Have you tolerated food without any problems? Yes.    Have you been able to return to your normal activities? Yes.    Do you have any questions about your discharge instructions: Diet   No. Medications  No. Follow up visit  No.  Do you have questions or concerns about your Care? No.  Actions: * If pain score is 4 or above: No action needed, pain <4.

## 2015-09-12 ENCOUNTER — Encounter (HOSPITAL_COMMUNITY): Payer: Self-pay | Admitting: Emergency Medicine

## 2015-09-12 ENCOUNTER — Emergency Department (INDEPENDENT_AMBULATORY_CARE_PROVIDER_SITE_OTHER)
Admission: EM | Admit: 2015-09-12 | Discharge: 2015-09-12 | Disposition: A | Discharge status: Home / Self Care | Payer: No Typology Code available for payment source | Source: Home / Self Care | Attending: Family Medicine | Admitting: Family Medicine

## 2015-09-12 ENCOUNTER — Other Ambulatory Visit (HOSPITAL_COMMUNITY)
Admission: RE | Admit: 2015-09-12 | Discharge: 2015-09-12 | Disposition: A | Discharge status: Home / Self Care | Payer: No Typology Code available for payment source | Source: Ambulatory Visit | Attending: Family Medicine | Admitting: Family Medicine

## 2015-09-12 DIAGNOSIS — Z113 Encounter for screening for infections with a predominantly sexual mode of transmission: Secondary | ICD-10-CM | POA: Insufficient documentation

## 2015-09-12 DIAGNOSIS — N760 Acute vaginitis: Secondary | ICD-10-CM | POA: Insufficient documentation

## 2015-09-12 HISTORY — DX: Essential (primary) hypertension: I10

## 2015-09-12 MED ORDER — METRONIDAZOLE 500 MG PO TABS: 500.0000 mg | ORAL_TABLET | Freq: Two times a day (BID) | ORAL | Status: DC

## 2015-09-12 NOTE — ED Provider Notes (Signed)
CSN: [id number]     Arrival date & time 09/12/15  1535 History   First MD Initiated Contact with Patient 09/12/15 1712     Chief Complaint  Patient presents with  . Verrucous Vulgaris   (Consider location/radiation/quality/duration/timing/severity/associated sxs/prior Treatment) HPI Comments: 60 year old female has had tingling in the vagina for 3 months. She also has a fishy odor. She describes a story in which her boyfriend of 8 years has been unfaithful and through a friend of a friend of a friend states that he gave her an area warts. She wants to be checked for everything.   Past Medical History  Diagnosis Date  . Hypertension    Past Surgical History  Procedure Laterality Date  . Partial hysterectomy    . Cesarean section      x2   Family History  Problem Relation Age of Onset  . Colon cancer Father 51  . Cancer Father    Social History  Substance Use Topics  . Smoking status: Current Every Day Smoker -- 0.25 packs/day for 20 years    Types: Cigarettes    Last Attempt to Quit: 08/19/2011  . Smokeless tobacco: Never Used  . Alcohol Use: 3.6 oz/week    6 Shots of liquor per week     Comment: 2-3 drinks per night of weekends per pt.   OB History    No data available     Review of Systems  Constitutional: Negative for fever, activity change and fatigue.  HENT: Negative.   Respiratory: Negative.   Genitourinary: Negative for dysuria, frequency, vaginal bleeding, vaginal discharge, vaginal pain and pelvic pain.       As per history of present illness    Allergies  Review of patient's allergies indicates no known allergies.  Home Medications   Prior to Admission medications   Medication Sig Start Date End Date Taking? Authorizing Provider  Ascorbic Acid (VITAMIN C) 1000 MG tablet Take 1,000 mg by mouth once a week.    Historical Provider, MD  bacitracin-polymyxin b (POLYSPORIN) ophthalmic ointment Place 1 application into both eyes every 12 (twelve) hours. apply  to eye every 12 hours while awake Patient not taking: Reported on 08/22/2015 10/11/14   Lorayne Marek, MD  carbamide peroxide (DEBROX) 6.5 % otic solution Place 5 drops into both ears 2 (two) times daily. Patient not taking: Reported on 08/22/2015 10/11/14   Lorayne Marek, MD  COD LIVER OIL PO Take 1 capsule by mouth once a week.    Historical Provider, MD  Coenzyme Q10 (CO Q 10) 100 MG CAPS Take 1 capsule by mouth daily.    Historical Provider, MD  Homeopathic Products (CACTUS COMPOSITUM PO) Take 1 capsule by mouth once a week.    Historical Provider, MD  hydrocortisone 2.5 % cream Apply topically 2 (two) times daily. 10/11/14   Lorayne Marek, MD  metroNIDAZOLE (FLAGYL) 500 MG tablet Take 1 tablet (500 mg total) by mouth 2 (two) times daily. X 7 days 09/12/15   Janne Napoleon, NP  omeprazole (PRILOSEC) 40 MG capsule Take 1 capsule (40 mg total) by mouth daily. Patient not taking: Reported on 08/22/2015 01/10/15   Lorayne Marek, MD  Vitamin D, Ergocalciferol, (DRISDOL) 50000 UNITS CAPS capsule Take 1 capsule (50,000 Units total) by mouth every 7 (seven) days. 10/12/14   Lorayne Marek, MD   Meds Ordered and Administered this Visit  Medications - No data to display  BP 168/78 mmHg  Pulse 57  Temp(Src) 97.9 F (36.6 C) (Oral)  Resp 18  SpO2 100% No data found.   Physical Exam  Constitutional: She appears well-developed and well-nourished. No distress.  Neck: Normal range of motion. Neck supple.  Cardiovascular: Normal rate, regular rhythm and normal heart sounds.   Pulmonary/Chest: Effort normal. No respiratory distress.  Genitourinary:  Normal external female genitalia. No external lesions. Vaginal walls with pallor and will also improve day. No bleeding and no vaginal discharge. No abnormal lesions within the vagina or externally. Old. Anal hemorrhoidal tags.  Lymphadenopathy:    She has no cervical adenopathy.  Neurological: She is alert. She exhibits normal muscle tone.  Skin: Skin is warm  and dry.  Psychiatric: She has a normal mood and affect.  Nursing note and vitals reviewed.   ED Course  Procedures (including critical care time)  Labs Review Labs Reviewed  CERVICOVAGINAL ANCILLARY ONLY    Imaging Review No results found.   Visual Acuity Review  Right Eye Distance:   Left Eye Distance:   Bilateral Distance:    Right Eye Near:   Left Eye Near:    Bilateral Near:         MDM   1. Vaginitis   2. Screening examination for STD (sexually transmitted disease)    Clinical exam did not show evidence of a specific disease or problem Flagyl empiric tx.    Janne Napoleon, NP 09/12/15 [phone]

## 2015-09-12 NOTE — ED Notes (Signed)
Patient has a vaginal "fishy odor", denies vaginal discharge.  Patient concerned for warts, has no lesions that she can detect.  Patient's partner dated someone that they have been told has warts

## 2015-09-12 NOTE — Discharge Instructions (Signed)
Vaginitis Clinical exam did not show evidence of a specific disease or problem Vaginitis is an inflammation of the vagina. It is most often caused by a change in the normal balance of the bacteria and yeast that live in the vagina. This change in balance causes an overgrowth of certain bacteria or yeast, which causes the inflammation. There are different types of vaginitis, but the most common types are:  Bacterial vaginosis.  Yeast infection (candidiasis).  Trichomoniasis vaginitis. This is a sexually transmitted infection (STI).  Viral vaginitis.  Atrophic vaginitis.  Allergic vaginitis. CAUSES  The cause depends on the type of vaginitis. Vaginitis can be caused by:  Bacteria (bacterial vaginosis).  Yeast (yeast infection).  A parasite (trichomoniasis vaginitis)  A virus (viral vaginitis).  Low hormone levels (atrophic vaginitis). Low hormone levels can occur during pregnancy, breastfeeding, or after menopause.  Irritants, such as bubble baths, scented tampons, and feminine sprays (allergic vaginitis). Other factors can change the normal balance of the yeast and bacteria that live in the vagina. These include:  Antibiotic medicines.  Poor hygiene.  Diaphragms, vaginal sponges, spermicides, birth control pills, and intrauterine devices (IUD).  Sexual intercourse.  Infection.  Uncontrolled diabetes.  A weakened immune system. SYMPTOMS  Symptoms can vary depending on the cause of the vaginitis. Common symptoms include:  Abnormal vaginal discharge.  The discharge is white, gray, or yellow with bacterial vaginosis.  The discharge is thick, white, and cheesy with a yeast infection.  The discharge is frothy and yellow or greenish with trichomoniasis.  A bad vaginal odor.  The odor is fishy with bacterial vaginosis.  Vaginal itching, pain, or swelling.  Painful intercourse.  Pain or burning when urinating. Sometimes, there are no symptoms. TREATMENT    Treatment will vary depending on the type of infection.   Bacterial vaginosis and trichomoniasis are often treated with antibiotic creams or pills.  Yeast infections are often treated with antifungal medicines, such as vaginal creams or suppositories.  Viral vaginitis has no cure, but symptoms can be treated with medicines that relieve discomfort. Your sexual partner should be treated as well.  Atrophic vaginitis may be treated with an estrogen cream, pill, suppository, or vaginal ring. If vaginal dryness occurs, lubricants and moisturizing creams may help. You may be told to avoid scented soaps, sprays, or douches.  Allergic vaginitis treatment involves quitting the use of the product that is causing the problem. Vaginal creams can be used to treat the symptoms. HOME CARE INSTRUCTIONS   Take all medicines as directed by your caregiver.  Keep your genital area clean and dry. Avoid soap and only rinse the area with water.  Avoid douching. It can remove the healthy bacteria in the vagina.  Do not use tampons or have sexual intercourse until your vaginitis has been treated. Use sanitary pads while you have vaginitis.  Wipe from front to back. This avoids the spread of bacteria from the rectum to the vagina.  Let air reach your genital area.  Wear cotton underwear to decrease moisture buildup.  Avoid wearing underwear while you sleep until your vaginitis is gone.  Avoid tight pants and underwear or nylons without a cotton panel.  Take off wet clothing (especially bathing suits) as soon as possible.  Use mild, non-scented products. Avoid using irritants, such as:  Scented feminine sprays.  Fabric softeners.  Scented detergents.  Scented tampons.  Scented soaps or bubble baths.  Practice safe sex and use condoms. Condoms may prevent the spread of trichomoniasis and viral  vaginitis. SEEK MEDICAL CARE IF:   You have abdominal pain.  You have a fever or persistent symptoms  for more than 2-3 days.  You have a fever and your symptoms suddenly get worse.   This information is not intended to replace advice given to you by your health care provider. Make sure you discuss any questions you have with your health care provider.   Document Released: 05/06/2007 Document Revised: 11/23/2014 Document Reviewed: 12/20/2011 Elsevier Interactive Patient Education 2016 [employer].  Sexually Transmitted Disease A sexually transmitted disease (STD) is a disease or infection that may be passed (transmitted) from person to person, usually during sexual activity. This may happen by way of saliva, semen, blood, vaginal mucus, or urine. Common STDs include:  Gonorrhea.  Chlamydia.  Syphilis.  HIV and AIDS.  Genital herpes.  Hepatitis B and C.  Trichomonas.  Human papillomavirus (HPV).  Pubic lice.  Scabies.  Mites.  Bacterial vaginosis. WHAT ARE CAUSES OF STDs? An STD may be caused by bacteria, a virus, or parasites. STDs are often transmitted during sexual activity if one person is infected. However, they may also be transmitted through nonsexual means. STDs may be transmitted after:   Sexual intercourse with an infected person.  Sharing sex toys with an infected person.  Sharing needles with an infected person or using unclean piercing or tattoo needles.  Having intimate contact with the genitals, mouth, or rectal areas of an infected person.  Exposure to infected fluids during birth. WHAT ARE THE SIGNS AND SYMPTOMS OF STDs? Different STDs have different symptoms. Some people may not have any symptoms. If symptoms are present, they may include:  Painful or bloody urination.  Pain in the pelvis, abdomen, vagina, anus, throat, or eyes.  A skin rash, itching, or irritation.  Growths, ulcerations, blisters, or sores in the genital and anal areas.  Abnormal vaginal discharge with or without bad odor.  Penile discharge in men.  Fever.  Pain or  bleeding during sexual intercourse.  Swollen glands in the groin area.  Yellow skin and eyes (jaundice). This is seen with hepatitis.  Swollen testicles.  Infertility.  Sores and blisters in the mouth. HOW ARE STDs DIAGNOSED? To make a diagnosis, your health care provider may:  Take a medical history.  Perform a physical exam.  Take a sample of any discharge to examine.  Swab the throat, cervix, opening to the penis, rectum, or vagina for testing.  Test a sample of your first morning urine.  Perform blood tests.  Perform a Pap test, if this applies.  Perform a colposcopy.  Perform a laparoscopy. HOW ARE STDs TREATED? Treatment depends on the STD. Some STDs may be treated but not cured.  Chlamydia, gonorrhea, trichomonas, and syphilis can be cured with antibiotic medicine.  Genital herpes, hepatitis, and HIV can be treated, but not cured, with prescribed medicines. The medicines lessen symptoms.  Genital warts from HPV can be treated with medicine or by freezing, burning (electrocautery), or surgery. Warts may come back.  HPV cannot be cured with medicine or surgery. However, abnormal areas may be removed from the cervix, vagina, or vulva.  If your diagnosis is confirmed, your recent sexual partners need treatment. This is true even if they are symptom-free or have a negative culture or evaluation. They should not have sex until their health care providers say it is okay.  Your health care provider may test you for infection again 3 months after treatment. HOW CAN I REDUCE MY RISK OF  GETTING AN STD? Take these steps to reduce your risk of getting an STD:  Use latex condoms, dental dams, and water-soluble lubricants during sexual activity. Do not use petroleum jelly or oils.  Avoid having multiple sex partners.  Do not have sex with someone who has other sex partners  Do not have sex with anyone you do not know or who is at high risk for an STD.  Avoid risky sex  practices that can break your skin.  Do not have sex if you have open sores on your mouth or skin.  Avoid drinking too much alcohol or taking illegal drugs. Alcohol and drugs can affect your judgment and put you in a vulnerable position.  Avoid engaging in oral and anal sex acts.  Get vaccinated for HPV and hepatitis. If you have not received these vaccines in the past, talk to your health care provider about whether one or both might be right for you.  If you are at risk of being infected with HIV, it is recommended that you take a prescription medicine daily to prevent HIV infection. This is called pre-exposure prophylaxis (PrEP). You are considered at risk if:  You are a man who has sex with other men (MSM).  You are a heterosexual man or woman and are sexually active with more than one partner.  You take drugs by injection.  You are sexually active with a partner who has HIV.  Talk with your health care provider about whether you are at high risk of being infected with HIV. If you choose to begin PrEP, you should first be tested for HIV. You should then be tested every 3 months for as long as you are taking PrEP. WHAT SHOULD I DO IF I THINK I HAVE AN STD?  See your health care provider.  Tell your sexual partner(s). They should be tested and treated for any STDs.  Do not have sex until your health care provider says it is okay. WHEN SHOULD I GET IMMEDIATE MEDICAL CARE? Contact your health care provider right away if:   You have severe abdominal pain.  You are a man and notice swelling or pain in your testicles.  You are a woman and notice swelling or pain in your vagina.   This information is not intended to replace advice given to you by your health care provider. Make sure you discuss any questions you have with your health care provider.   Document Released: 09/29/2002 Document Revised: 07/30/2014 Document Reviewed: 01/27/2013 Elsevier Interactive Patient Education  2016 Elsevier Inc.  Bacterial Vaginosis Bacterial vaginosis is a vaginal infection that occurs when the normal balance of bacteria in the vagina is disrupted. It results from an overgrowth of certain bacteria. This is the most common vaginal infection in women of childbearing age. Treatment is important to prevent complications, especially in pregnant women, as it can cause a premature delivery. CAUSES  Bacterial vaginosis is caused by an increase in harmful bacteria that are normally present in smaller amounts in the vagina. Several different kinds of bacteria can cause bacterial vaginosis. However, the reason that the condition develops is not fully understood. RISK FACTORS Certain activities or behaviors can put you at an increased risk of developing bacterial vaginosis, including:  Having a new sex partner or multiple sex partners.  Douching.  Using an intrauterine device (IUD) for contraception. Women do not get bacterial vaginosis from toilet seats, bedding, swimming pools, or contact with objects around them. SIGNS AND SYMPTOMS  Some women  with bacterial vaginosis have no signs or symptoms. Common symptoms include:  Grey vaginal discharge.  A fishlike odor with discharge, especially after sexual intercourse.  Itching or burning of the vagina and vulva.  Burning or pain with urination. DIAGNOSIS  Your health care provider will take a medical history and examine the vagina for signs of bacterial vaginosis. A sample of vaginal fluid may be taken. Your health care provider will look at this sample under a microscope to check for bacteria and abnormal cells. A vaginal pH test may also be done.  TREATMENT  Bacterial vaginosis may be treated with antibiotic medicines. These may be given in the form of a pill or a vaginal cream. A second round of antibiotics may be prescribed if the condition comes back after treatment. Because bacterial vaginosis increases your risk for sexually  transmitted diseases, getting treated can help reduce your risk for chlamydia, gonorrhea, HIV, and herpes. HOME CARE INSTRUCTIONS   Only take over-the-counter or prescription medicines as directed by your health care provider.  If antibiotic medicine was prescribed, take it as directed. Make sure you finish it even if you start to feel better.  Tell all sexual partners that you have a vaginal infection. They should see their health care provider and be treated if they have problems, such as a mild rash or itching.  During treatment, it is important that you follow these instructions:  Avoid sexual activity or use condoms correctly.  Do not douche.  Avoid alcohol as directed by your health care provider.  Avoid breastfeeding as directed by your health care provider. SEEK MEDICAL CARE IF:   Your symptoms are not improving after 3 days of treatment.  You have increased discharge or pain.  You have a fever. MAKE SURE YOU:   Understand these instructions.  Will watch your condition.  Will get help right away if you are not doing well or get worse. FOR MORE INFORMATION  Centers for Disease Control and Prevention, Division of STD Prevention: [employer] American Sexual Health Association (ASHA): www.ashastd.org    This information is not intended to replace advice given to you by your health care provider. Make sure you discuss any questions you have with your health care provider.   Document Released: 07/09/2005 Document Revised: 07/30/2014 Document Reviewed: 02/18/2013 Elsevier Interactive Patient Education [employer].

## 2015-09-13 LAB — CERVICOVAGINAL ANCILLARY ONLY
Chlamydia: NEGATIVE
Neisseria Gonorrhea: NEGATIVE

## 2015-09-14 LAB — CERVICOVAGINAL ANCILLARY ONLY: Wet Prep (BD Affirm): POSITIVE — AB

## 2015-09-15 ENCOUNTER — Encounter: Payer: Self-pay | Admitting: Gastroenterology

## 2015-09-16 MED FILL — metroNIDAZOLE 500 MG TABS: 500 | 7 days supply | Qty: 14 | Fill #0

## 2015-09-19 ENCOUNTER — Ambulatory Visit: Payer: No Typology Code available for payment source | Attending: Internal Medicine

## 2015-10-10 ENCOUNTER — Telehealth: Payer: Self-pay

## 2015-10-10 ENCOUNTER — Encounter: Payer: Self-pay | Admitting: Internal Medicine

## 2015-10-10 ENCOUNTER — Telehealth: Payer: Self-pay | Admitting: Internal Medicine

## 2015-10-10 ENCOUNTER — Ambulatory Visit: Payer: Self-pay | Attending: Internal Medicine | Admitting: Internal Medicine

## 2015-10-10 VITALS — BP 151/83 | HR 79 | Temp 98.6°F | Resp 15 | Ht 64.5 in | Wt 156.6 lb

## 2015-10-10 DIAGNOSIS — Z0000 Encounter for general adult medical examination without abnormal findings: Secondary | ICD-10-CM

## 2015-10-10 DIAGNOSIS — F17210 Nicotine dependence, cigarettes, uncomplicated: Secondary | ICD-10-CM | POA: Insufficient documentation

## 2015-10-10 DIAGNOSIS — I10 Essential (primary) hypertension: Secondary | ICD-10-CM | POA: Insufficient documentation

## 2015-10-10 DIAGNOSIS — E559 Vitamin D deficiency, unspecified: Secondary | ICD-10-CM | POA: Insufficient documentation

## 2015-10-10 DIAGNOSIS — R300 Dysuria: Secondary | ICD-10-CM

## 2015-10-10 DIAGNOSIS — A499 Bacterial infection, unspecified: Secondary | ICD-10-CM

## 2015-10-10 DIAGNOSIS — N760 Acute vaginitis: Secondary | ICD-10-CM | POA: Insufficient documentation

## 2015-10-10 DIAGNOSIS — B9689 Other specified bacterial agents as the cause of diseases classified elsewhere: Secondary | ICD-10-CM

## 2015-10-10 DIAGNOSIS — K029 Dental caries, unspecified: Secondary | ICD-10-CM | POA: Insufficient documentation

## 2015-10-10 DIAGNOSIS — Z79899 Other long term (current) drug therapy: Secondary | ICD-10-CM | POA: Insufficient documentation

## 2015-10-10 LAB — POCT URINALYSIS DIPSTICK
Bilirubin, UA: NEGATIVE
GLUCOSE UA: NEGATIVE
KETONES UA: NEGATIVE
Leukocytes, UA: NEGATIVE
Nitrite, UA: NEGATIVE
PROTEIN UA: NEGATIVE
SPEC GRAV UA: 1.015
UROBILINOGEN UA: 0.2
pH, UA: 6.5

## 2015-10-10 MED ORDER — METRONIDAZOLE 500 MG PO TABS: 500.0000 mg | ORAL_TABLET | Freq: Two times a day (BID) | ORAL | Status: DC

## 2015-10-10 NOTE — Telephone Encounter (Signed)
CMA called patient, patient did not answer. Message was left for the patient to return my call.

## 2015-10-10 NOTE — Addendum Note (Signed)
Addended by: [employer] on: 10/10/2015 05:22 PM   Modules accepted: Orders

## 2015-10-10 NOTE — Progress Notes (Signed)
Patient states she needs referral to dentist for routine care and assessment of dental caries Reports she thinks she has a bladder infection Trying to quit smoking cigarettes and reports ETOH use on weekends

## 2015-10-10 NOTE — Progress Notes (Signed)
Shannon [name], is a 60 y.o. female  [id number]  [id number]  DOB - 30-Jan-1956  CC:  Chief Complaint  Patient presents with  . dental referral  . pain during intercourse       HPI: Shannon [name] is a 60 y.o. female here today to establish medical care and referral for dentist for dental carries. C/o of dysparunia during intercourse.  No dysuria.  No f/c.  Was seen in urgent care 13month ago, and prescribed flagyle for BV (+gardenella), but she did not take all the rx.  She is concerned for a UTI, similar sxs like she had few years ago.  Says she is trying to stop smoking last couple of weeks.   Had bad "flu-like" illness 2 wks ago, and was taking OTC nyquil. She thought she might have drank etoh one of those days and that might have caused her uti.  She takes numerous herbals and supplements.  Currently taking a daily Vit D as well.  Patient has No headache, No chest pain, No abdominal pain - No Nausea, No new weakness tingling or numbness, No Cough - SOB.  No Known Allergies Past Medical History  Diagnosis Date  . Hypertension    Current Outpatient Prescriptions on File Prior to Visit  Medication Sig Dispense Refill  . Ascorbic Acid (VITAMIN C) 1000 MG tablet Take 1,000 mg by mouth once a week.    . COD LIVER OIL PO Take 1 capsule by mouth once a week.    . Coenzyme Q10 (CO Q 10) 100 MG CAPS Take 1 capsule by mouth daily.    . bacitracin-polymyxin b (POLYSPORIN) ophthalmic ointment Place 1 application into both eyes every 12 (twelve) hours. apply to eye every 12 hours while awake (Patient not taking: Reported on 08/22/2015) 3.5 g 0  . carbamide peroxide (DEBROX) 6.5 % otic solution Place 5 drops into both ears 2 (two) times daily. (Patient not taking: Reported on 08/22/2015) 15 mL 1  . Homeopathic Products (CACTUS COMPOSITUM PO) Take 1 capsule by mouth once a week. Reported on 10/10/2015    . hydrocortisone 2.5 % cream Apply topically 2 (two) times daily. (Patient not taking:  Reported on 10/10/2015) 30 g 0  . omeprazole (PRILOSEC) 40 MG capsule Take 1 capsule (40 mg total) by mouth daily. (Patient not taking: Reported on 08/22/2015) 30 capsule 3   No current facility-administered medications on file prior to visit.   Family History  Problem Relation Age of Onset  . Colon cancer Father 56  . Cancer Father    Social History   Social History  . Marital Status: Single    Spouse Name: N/A  . Number of Children: N/A  . Years of Education: N/A   Occupational History  . Not on file.   Social History Main Topics  . Smoking status: Current Some Day Smoker -- 0.25 packs/day for 20 years    Types: Cigarettes  . Smokeless tobacco: Never Used  . Alcohol Use: 3.6 oz/week    6 Shots of liquor per week     Comment: 2-3 drinks per night of weekends per pt.  . Drug Use: No  . Sexual Activity: Not on file   Other Topics Concern  . Not on file   Social History Narrative    Review of Systems: Constitutional: Negative for fever, chills, diaphoresis, activity change, appetite change and fatigue. HENT: Negative for ear pain, nosebleeds, congestion, facial swelling, rhinorrhea, neck pain, neck stiffness and ear discharge.  Eyes: Negative  for pain, discharge, redness, itching and visual disturbance. Respiratory: Negative for cough, choking, chest tightness, shortness of breath, wheezing and stridor.  Cardiovascular: Negative for chest pain, palpitations and leg swelling. Gastrointestinal: Negative for abdominal distention. Genitourinary: Negative for dysuria, urgency, frequency, hematuria, flank pain, decreased urine volume, difficulty urinating; + dyspareunia, states she always urinates after intercourse. Musculoskeletal: Negative for back pain, joint swelling, arthralgia and gait problem. Neurological: Negative for dizziness, tremors, seizures, syncope, facial asymmetry, speech difficulty, weakness, light-headedness, numbness and headaches.  Hematological: Negative  for adenopathy. Does not bruise/bleed easily. Psychiatric/Behavioral: Negative for hallucinations, behavioral problems, confusion, dysphoric mood, decreased concentration and agitation.    Objective:   Filed Vitals:   10/10/15 1423  BP: 151/83  Pulse: 79  Temp: 98.6 F (37 C)  Resp: 15    Physical Exam: Constitutional: Patient appears well-developed and well-nourished. No distress. aaox 3. Pleasant. HENT: Normocephalic, atraumatic, External right and left ear normal. Oropharynx is clear and moist.  Eyes: Conjunctivae and EOM are normal. PERRL, no scleral icterus. Neck: Normal ROM. Neck supple. No JVD. No tracheal deviation. No thyromegaly. CVS: RRR, S1/S2 +, no murmurs, no gallops, no carotid bruit.  Pulmonary: Effort and breath sounds normal, no stridor, rhonchi, wheezes, rales.  Abdominal: Soft. BS +, no distension, tenderness, rebound or guarding.  Musculoskeletal: Normal range of motion. No edema and no tenderness.  Lymphadenopathy: No lymphadenopathy noted, cervical, inguinal or axillary Neuro: Alert. Normal reflexes, muscle tone coordination. Cns 2-12 grossly intact. Skin: Skin is warm and dry. No rash noted. Not diaphoretic. No erythema. No pallor. Psychiatric: Normal mood and affect. Behavior, judgment, thought content normal.  Lab Results  Component Value Date   WBC 6.7 10/11/2014   HGB 13.6 10/11/2014   HCT 40.6 10/11/2014   MCV 85.5 10/11/2014   PLT 213 10/11/2014   Lab Results  Component Value Date   CREATININE 0.72 10/11/2014   BUN 11 10/11/2014   NA 141 10/11/2014   K 3.9 10/11/2014   CL 104 10/11/2014   CO2 30 10/11/2014    Lab Results  Component Value Date   HGBA1C 5.3 10/11/2014   Lipid Panel     Component Value Date/Time   CHOL 169 09/10/2008 2037   TRIG 68 09/10/2008 2037   HDL 46 09/10/2008 2037   CHOLHDL 3.7 Ratio 09/10/2008 2037   VLDL 14 09/10/2008 2037   LDLCALC 109* 09/10/2008 2037   Urine dip  - neg     Assessment and plan:    1. Vitamin D deficiency chk levels - Vitamin D, 25-hydroxy  2. dysparunia + BV last month w/ Gardenella + wetprep, neg G&C, renew Flagyl. - Urinalysis Dipstick  negative  3. Bacterial vaginitis - renewed, recd to finish course; - metroNIDAZOLE (FLAGYL) 500 MG tablet; Take 1 tablet (500 mg total) by mouth 2 (two) times daily. X 7 days  Dispense: 14 tablet; Refill: 0  4. Health care maintenance Will chk/ - TSH - Vitamin D, 25-hydroxy - CBC with Differential - Basic Metabolic Panel - last mammogram 06/13/15, due in 2 years - colonoscopy 09/05/15, +polyps, need repeat in  Years.  5. Dental cavities - Ambulatory referral to Dentistry   Return in about 3 months (around 01/10/2016), or if symptoms worsen or fail to improve.  The patient was given clear instructions to go to ER or return to medical center if symptoms don't improve, worsen or new problems develop. The patient verbalized understanding. The patient was told to call to get lab results if they haven't  heard anything in the next week.     Maren Reamer, MD, [facility] [place], [place]   10/10/2015, 3:51 PM

## 2015-10-10 NOTE — Telephone Encounter (Signed)
Patient stated that during office visit, she was referred to a dentist. Patient, if possible, would prefer a Monday appointment for Dentist visit.Marland Kitchen Please follow up.

## 2015-10-10 NOTE — Patient Instructions (Signed)
[facility] 2 wks for BP chk. Dental referral   It was a pleasure meeting you.

## 2015-10-11 ENCOUNTER — Telehealth: Payer: Self-pay

## 2015-10-11 MED FILL — metroNIDAZOLE 500 MG TABS: 500 | 7 days supply | Qty: 14 | Fill #0

## 2015-10-11 NOTE — Telephone Encounter (Addendum)
CMA called patient, patient verified name and DOB. Patient was seen on 10/10/2015. Patient did not stop at the lab for blood work after instructed, so I called patient today, 03/21 to inform patient about the need for the test. Patient verbalized she understood and was transferred to the front for a lab only visit on 03/22.  Patient also called requesting a Monday appt with the Dental referral, so I informed her that I would send a message to Alinda Sierras with her request, however, this may delay her getting in sooner seeing how she wanting an appt on a particular day. Patient verbalized she understood, with no further questions.

## 2015-10-12 ENCOUNTER — Ambulatory Visit: Payer: Self-pay | Attending: Internal Medicine

## 2015-10-12 DIAGNOSIS — Z0000 Encounter for general adult medical examination without abnormal findings: Secondary | ICD-10-CM | POA: Insufficient documentation

## 2015-10-12 LAB — CBC WITH DIFFERENTIAL/PLATELET
Basophils Absolute: 0 10*3/uL (ref 0.0–0.1)
Basophils Relative: 0 % (ref 0–1)
Eosinophils Absolute: 0.1 10*3/uL (ref 0.0–0.7)
Eosinophils Relative: 2 % (ref 0–5)
HEMATOCRIT: 39.4 % (ref 36.0–46.0)
HEMOGLOBIN: 13.3 g/dL (ref 12.0–15.0)
LYMPHS ABS: 1.9 10*3/uL (ref 0.7–4.0)
LYMPHS PCT: 38 % (ref 12–46)
MCH: 28.5 pg (ref 26.0–34.0)
MCHC: 33.8 g/dL (ref 30.0–36.0)
MCV: 84.4 fL (ref 78.0–100.0)
MONO ABS: 0.6 10*3/uL (ref 0.1–1.0)
MONOS PCT: 13 % — AB (ref 3–12)
MPV: 9.5 fL (ref 8.6–12.4)
NEUTROS ABS: 2.3 10*3/uL (ref 1.7–7.7)
Neutrophils Relative %: 47 % (ref 43–77)
Platelets: 228 10*3/uL (ref 150–400)
RBC: 4.67 MIL/uL (ref 3.87–5.11)
RDW: 13.4 % (ref 11.5–15.5)
WBC: 4.9 10*3/uL (ref 4.0–10.5)

## 2015-10-12 LAB — BASIC METABOLIC PANEL
BUN: 18 mg/dL (ref 7–25)
CALCIUM: 8.8 mg/dL (ref 8.6–10.4)
CO2: 25 mmol/L (ref 20–31)
CREATININE: 0.69 mg/dL (ref 0.50–1.05)
Chloride: 108 mmol/L (ref 98–110)
GLUCOSE: 87 mg/dL (ref 65–99)
Potassium: 4.2 mmol/L (ref 3.5–5.3)
Sodium: 143 mmol/L (ref 135–146)

## 2015-10-12 LAB — TSH: TSH: 0.66 mIU/L

## 2015-10-12 NOTE — Progress Notes (Signed)
Patient's here for lab visit only. 

## 2015-10-13 LAB — VITAMIN D 25 HYDROXY (VIT D DEFICIENCY, FRACTURES): Vit D, 25-Hydroxy: 33 ng/mL (ref 30–100)

## 2015-10-17 ENCOUNTER — Telehealth: Payer: Self-pay

## 2015-10-17 NOTE — Telephone Encounter (Signed)
-----   Message from Maren Reamer, MD sent at 10/13/2015  5:41 PM EDT ----- Please call patient about her lab work.  Everything looks great, blood count normal, no signs of anemia.  Thyroid, kidneys, electrolytes, and vit D levels all normal.  Keep up the great work. Thanks.

## 2015-10-17 NOTE — Telephone Encounter (Signed)
CMA called patient, patient verified name and DOB. Patient was given lab results, verbalized she understood with no further questions.

## 2015-11-16 ENCOUNTER — Other Ambulatory Visit: Payer: Self-pay | Admitting: Internal Medicine

## 2015-11-16 NOTE — Telephone Encounter (Signed)
Patient is requesting a prescription for metroNIDAZOLE (FLAGYL) 500 MG tablet [id number]...she misplaced the medication...please follow up with patient

## 2015-12-05 ENCOUNTER — Telehealth: Payer: Self-pay | Admitting: Internal Medicine

## 2015-12-05 NOTE — Telephone Encounter (Signed)
Patient came into office requesting to speak to nurse regarding test results, please f/u with patient . Patient states she will have her phone back on starting Wednesday 12/07/15

## 2015-12-08 NOTE — Telephone Encounter (Signed)
Clld pt  - advsd of lab results from 10/12/15 - advsd no STD screening was done. Pt stated she was just wondering since she found out ex had been stepping out with someone else. Advsd pt if she experiences any Vaginal pain or irregularities, ie. - Itching, discharge, labia pain - to make OV for evaluation and treatment. Pt stated she understood and would.

## 2015-12-13 ENCOUNTER — Other Ambulatory Visit: Payer: Self-pay | Admitting: Internal Medicine

## 2015-12-13 DIAGNOSIS — B9689 Other specified bacterial agents as the cause of diseases classified elsewhere: Secondary | ICD-10-CM

## 2015-12-13 DIAGNOSIS — N760 Acute vaginitis: Principal | ICD-10-CM

## 2015-12-13 MED ORDER — METRONIDAZOLE 500 MG PO TABS: 500.0000 mg | ORAL_TABLET | Freq: Two times a day (BID) | ORAL | Status: DC

## 2015-12-13 NOTE — Telephone Encounter (Signed)
I refilled the rx of flagyl. Please tell her to pick up and complete the course. Thanks.

## 2015-12-13 NOTE — Telephone Encounter (Signed)
Patient is stating she misplaced the prescription for Flagyl and is requesting a refill.

## 2016-03-05 ENCOUNTER — Ambulatory Visit: Payer: Self-pay | Attending: Internal Medicine

## 2016-04-02 ENCOUNTER — Ambulatory Visit: Payer: Self-pay | Attending: Internal Medicine | Admitting: Internal Medicine

## 2016-04-02 ENCOUNTER — Encounter: Payer: Self-pay | Admitting: Internal Medicine

## 2016-04-02 VITALS — BP 173/93 | HR 52 | Temp 98.1°F | Resp 16 | Wt 151.6 lb

## 2016-04-02 DIAGNOSIS — I10 Essential (primary) hypertension: Secondary | ICD-10-CM | POA: Insufficient documentation

## 2016-04-02 DIAGNOSIS — Z1159 Encounter for screening for other viral diseases: Secondary | ICD-10-CM | POA: Insufficient documentation

## 2016-04-02 DIAGNOSIS — Z23 Encounter for immunization: Secondary | ICD-10-CM

## 2016-04-02 DIAGNOSIS — B9689 Other specified bacterial agents as the cause of diseases classified elsewhere: Secondary | ICD-10-CM

## 2016-04-02 DIAGNOSIS — N760 Acute vaginitis: Secondary | ICD-10-CM | POA: Insufficient documentation

## 2016-04-02 DIAGNOSIS — K589 Irritable bowel syndrome without diarrhea: Secondary | ICD-10-CM | POA: Insufficient documentation

## 2016-04-02 DIAGNOSIS — A499 Bacterial infection, unspecified: Secondary | ICD-10-CM

## 2016-04-02 DIAGNOSIS — K219 Gastro-esophageal reflux disease without esophagitis: Secondary | ICD-10-CM | POA: Insufficient documentation

## 2016-04-02 LAB — HEPATITIS C ANTIBODY: HCV AB: NEGATIVE

## 2016-04-02 MED ORDER — SACCHAROMYCES BOULARDII 250 MG PO CAPS: 250.0000 mg | ORAL_CAPSULE | Freq: Two times a day (BID) | ORAL | 2 refills | Status: DC

## 2016-04-02 MED ORDER — FAMOTIDINE 20 MG PO TABS: 20.0000 mg | ORAL_TABLET | Freq: Two times a day (BID) | ORAL | 1 refills | Status: DC

## 2016-04-02 MED ORDER — CITALOPRAM HYDROBROMIDE 10 MG PO TABS: 10.0000 mg | ORAL_TABLET | Freq: Every day | ORAL | 2 refills | Status: DC

## 2016-04-02 MED ORDER — METRONIDAZOLE 500 MG PO TABS: 500.0000 mg | ORAL_TABLET | Freq: Two times a day (BID) | ORAL | 0 refills | Status: DC

## 2016-04-02 MED ORDER — HYDROCHLOROTHIAZIDE 25 MG PO TABS: 25.0000 mg | ORAL_TABLET | Freq: Every day | ORAL | 3 refills | Status: DC

## 2016-04-02 MED ORDER — HYOSCYAMINE SULFATE 0.125 MG SL SUBL: 0.1250 mg | SUBLINGUAL_TABLET | Freq: Four times a day (QID) | SUBLINGUAL | 0 refills | Status: DC | PRN

## 2016-04-02 MED FILL — metroNIDAZOLE 500 MG TABS: 500 | 7 days supply | Qty: 14 | Fill #0

## 2016-04-02 NOTE — Progress Notes (Signed)
Shannon [name], is a 60 y.o. female  [id number]  [id number]  DOB - 1955/09/27  Chief Complaint  Patient presents with  . Follow-up        Subjective:   Shannon [name] is a 60 y.o. female here today for a follow up visit.  Pt c/o of more frequent bowel movments/loose stools whenever she eats certain foods. She cannot pinpoint what foods she is reacting to, happens out of blue at times, more often than prior.  C/o of recurring BV infection as well, w/ more discharge. No dysuria.  No f/c. She has been eating Activia yogurt for bv prevention, it was helping somewhat, but sometimes she has bms with this as well.  Prior hx of egd w/ noted HH.  Per pt, has intermittent heartburn at times pending certain foods, does not take anything regularly. Sometimes peptobismul.  Denies tob smoking.  Stressed out at times as well.  Denies si/hi/avh, but lots of stressors home. 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  Sometimes w/ associated abd cramps/pains.   Patient has No headache, No chest pain, No abdominal pain - No Nausea, No new weakness tingling or numbness, No Cough - SOB.  No problems updated.  ALLERGIES: No Known Allergies  PAST MEDICAL HISTORY: Past Medical History:  Diagnosis Date  . Hypertension     MEDICATIONS AT HOME: Prior to Admission medications   Medication Sig Start Date End Date Taking? Authorizing Provider  Ascorbic Acid (VITAMIN C) 1000 MG tablet Take 1,000 mg by mouth once a week.   Yes Historical Provider, MD  bacitracin-polymyxin b (POLYSPORIN) ophthalmic ointment Place 1 application into both eyes every 12 (twelve) hours. apply to eye every 12 hours while awake 10/11/14  Yes Deepak Advani, MD  carbamide peroxide (DEBROX) 6.5 % otic solution Place 5 drops into both ears 2 (two) times daily. 10/11/14  Yes Deepak Advani, MD  COD LIVER OIL PO Take 1 capsule by mouth once a week.   Yes Historical Provider, MD  Coenzyme Q10 (CO Q  10) 100 MG CAPS Take 1 capsule by mouth daily.   Yes Historical Provider, MD  Homeopathic Products (CACTUS COMPOSITUM PO) Take 1 capsule by mouth once a week. Reported on 10/10/2015   Yes Historical Provider, MD  hydrocortisone 2.5 % cream Apply topically 2 (two) times daily. 10/11/14  Yes Lorayne Marek, MD  omeprazole (PRILOSEC) 40 MG capsule Take 1 capsule (40 mg total) by mouth daily. 01/10/15  Yes Lorayne Marek, MD  citalopram (CELEXA) 10 MG tablet Take 1 tablet (10 mg total) by mouth daily. 04/02/16   Maren Reamer, MD  famotidine (PEPCID) 20 MG tablet Take 1 tablet (20 mg total) by mouth 2 (two) times daily. 04/02/16 04/02/17  Maren Reamer, MD  hydrochlorothiazide (HYDRODIURIL) 25 MG tablet Take 1 tablet (25 mg total) by mouth daily. 04/02/16   Maren Reamer, MD  hyoscyamine (LEVSIN/SL) 0.125 MG SL tablet Place 1 tablet (0.125 mg total) under the tongue every 6 (six) hours as needed. 04/02/16   Maren Reamer, MD  metroNIDAZOLE (FLAGYL) 500 MG tablet Take 1 tablet (500 mg total) by mouth 2 (two) times daily. X 7 days 04/02/16   Maren Reamer, MD  saccharomyces boulardii (FLORASTOR) 250 MG capsule Take 1 capsule (250 mg total) by mouth 2 (two) times daily. 04/02/16   Maren Reamer, MD     Objective:   Vitals:   04/02/16 1000  BP: (!) 173/93  Pulse: (!) 52  Resp: 16  Temp: 98.1 F (36.7 C)  TempSrc: Oral  SpO2: 99%  Weight: 151 lb 9.6 oz (68.8 kg)    Exam General appearance : Awake, alert, not in any distress. Speech Clear. Not toxic looking, pleasant HEENT: Atraumatic and Normocephalic, pupils equally reactive to light. Neck: supple, no JVD. Chest:Good air entry bilaterally, no added sounds. CVS: S1 S2 regular, no murmurs/gallups or rubs. Abdomen: Bowel sounds active, Non tender and not distended with no gaurding, rigidity or rebound. Extremities: B/L Lower Ext shows no edema, both legs are warm to touch Neurology: Awake alert, and oriented X 3, CN II-XII grossly  intact, Non focal Skin:No Rash  Data Review Lab Results  Component Value Date   HGBA1C 5.3 10/11/2014    Depression screen [facility] 2/9 04/02/2016 10/10/2015  Decreased Interest 0 0  Down, Depressed, Hopeless 0 0  PHQ - 2 Score 0 0      Assessment & Plan   1. htn - low salt diet discussed, bp remains high, encourage more water/exercise - hctz 25 qd started   2. Gastroesophageal reflux disease without esophagitis Trial pepcid bid, gerd diet discussed, ho HH as well  3. IBS (irritable bowel syndrome) Suspected, may be associated w/ anxiety/stress - trial celexa 10 qday, take qod x 3-4 day. - levsin prn - pepcid bid - probiotics, florastor bid - food diary recd to determine what foods she is reacting to, may be lactose intolerant, etc.  4. Bacterial vaginitis, possible recurrence - metroNIDAZOLE (FLAGYL) 500 MG tablet; Take 1 tablet (500 mg total) by mouth 2 (two) times daily. X 7 days  Dispense: 14 tablet; Refill: 0   5. Need for hepatitis C screening test - Hepatitis C antibody Patient have been counseled extensively about nutrition and exercise  Return in about 3 weeks (around 04/23/2016) for ibs/ anxiety.  The patient was given clear instructions to go to ER or return to medical center if symptoms don't improve, worsen or new problems develop. The patient verbalized understanding. The patient was told to call to get lab results if they haven't heard anything in the next week.   This note has been created with Surveyor, quantity. Any transcriptional errors are unintentional.   Maren Reamer, MD, [facility] [place], [place]   04/02/2016, 11:52 AM

## 2016-04-02 NOTE — Progress Notes (Signed)
Pt is in the office today for a 3 month f/u Pt states she is not in any pain today Pt states she was told she had a high hernia Pt states when she eat certain foods she goes to the bathroom and it seems to be getting worse

## 2016-04-02 NOTE — Patient Instructions (Addendum)
- take the celexa every other day for 4 days, than can take daily.   Gastroesophageal Reflux Disease, Adult Normally, food travels down the esophagus and stays in the stomach to be digested. If a person has gastroesophageal reflux disease (GERD), food and stomach acid move back up into the esophagus. When this happens, the esophagus becomes sore and swollen (inflamed). Over time, GERD can make small holes (ulcers) in the lining of the esophagus. HOME CARE Diet  Follow a diet as told by your doctor. You may need to avoid foods and drinks such as:  Coffee and tea (with or without caffeine).  Drinks that contain alcohol.  Energy drinks and sports drinks.  Carbonated drinks or sodas.  Chocolate and cocoa.  Peppermint and mint flavorings.  Garlic and onions.  Horseradish.  Spicy and acidic foods, such as peppers, chili powder, curry powder, vinegar, hot sauces, and BBQ sauce.  Citrus fruit juices and citrus fruits, such as oranges, lemons, and limes.  Tomato-based foods, such as red sauce, chili, salsa, and pizza with red sauce.  Fried and fatty foods, such as donuts, french fries, potato chips, and high-fat dressings.  High-fat meats, such as hot dogs, rib eye steak, sausage, ham, and bacon.  High-fat dairy items, such as whole milk, butter, and cream cheese.  Eat small meals often. Avoid eating large meals.  Avoid drinking large amounts of liquid with your meals.  Avoid eating meals during the 2-3 hours before bedtime.  Avoid lying down right after you eat.  Do not exercise right after you eat. General Instructions  Pay attention to any changes in your symptoms.  Take over-the-counter and prescription medicines only as told by your doctor. Do not take aspirin, ibuprofen, or other NSAIDs unless your doctor says it is okay.  Do not use any tobacco products, including cigarettes, chewing tobacco, and e-cigarettes. If you need help quitting, ask your doctor.  Wear  loose clothes. Do not wear anything tight around your waist.  Raise (elevate) the head of your bed about 6 inches (15 cm).  Try to lower your stress. If you need help doing this, ask your doctor.  If you are overweight, lose an amount of weight that is healthy for you. Ask your doctor about a safe weight loss goal.  Keep all follow-up visits as told by your doctor. This is important. GET HELP IF:  You have new symptoms.  You lose weight and you do not know why it is happening.  You have trouble swallowing, or it hurts to swallow.  You have wheezing or a cough that keeps happening.  Your symptoms do not get better with treatment.  You have a hoarse voice. GET HELP RIGHT AWAY IF:  You have pain in your arms, neck, jaw, teeth, or back.  You feel sweaty, dizzy, or light-headed.  You have chest pain or shortness of breath.  You throw up (vomit) and your throw up looks like blood or coffee grounds.  You pass out (faint).  Your poop (stool) is bloody or black.  You cannot swallow, drink, or eat.   This information is not intended to replace advice given to you by your health care provider. Make sure you discuss any questions you have with your health care provider.   Document Released: 12/26/2007 Document Revised: 03/30/2015 Document Reviewed: 11/03/2014 Elsevier Interactive Patient Education 2016 Elsevier Inc.  - Irritable Bowel Syndrome, Adult Irritable bowel syndrome (IBS) is not one specific disease. It is a group of symptoms that  affects the organs responsible for digestion (gastrointestinal or GI tract).  To regulate how your GI tract works, your body sends signals back and forth between your intestines and your brain. If you have IBS, there may be a problem with these signals. As a result, your GI tract does not function normally. Your intestines may become more sensitive and overreact to certain things. This is especially true when you eat certain foods or when you are  under stress.  There are four types of IBS. These may be determined based on the consistency of your stool:   IBS with diarrhea.   IBS with constipation.   Mixed IBS.   Unsubtyped IBS.  It is important to know which type of IBS you have. Some treatments are more likely to be helpful for certain types of IBS.  CAUSES  The exact cause of IBS is not known. RISK FACTORS You may have a higher risk of IBS if:  You are a woman.  You are younger than 59 years old.  You have a family history of IBS.  You have mental health problems.  You have had bacterial infection of your GI tract. SIGNS AND SYMPTOMS  Symptoms of IBS vary from person to person. The main symptom is abdominal pain or discomfort. Additional symptoms usually include one or more of the following:   Diarrhea, constipation, or both.   Abdominal swelling or bloating.   Feeling full or sick after eating a small or regular-size meal.   Frequent gas.   Mucus in the stool.   A feeling of having more stool left after a bowel movement.  Symptoms tend to come and go. They may be associated with stress, psychiatric conditions, or nothing at all.  DIAGNOSIS  There is no specific test to diagnose IBS. Your health care provider will make a diagnosis based on a physical exam, medical history, and your symptoms. You may have other tests to rule out other conditions that may be causing your symptoms. These may include:   Blood tests.   X-rays.   CT scan.  Endoscopy and colonoscopy. This is a test in which your GI tract is viewed with a long, thin, flexible tube. TREATMENT There is no cure for IBS, but treatment can help relieve symptoms. IBS treatment often includes:   Changes to your diet, such as:  Eating more fiber.  Avoiding foods that cause symptoms.  Drinking more water.  Eating regular, medium-sized portioned meals.  Medicines. These may include:  Fiber supplements if you have  constipation.  Medicine to control diarrhea (antidiarrheal medicines).  Medicine to help control muscle spasms in your GI tract (antispasmodic medicines).  Medicines to help with any mental health issues, such as antidepressants or tranquilizers.  Therapy.  Talk therapy may help with anxiety, depression, or other mental health issues that can make IBS symptoms worse.  Stress reduction.  Managing your stress can help keep symptoms under control. HOME CARE INSTRUCTIONS   Take medicines only as directed by your health care provider.  Eat a healthy diet.  Avoid foods and drinks with added sugar.  Include more whole grains, fruits, and vegetables gradually into your diet. This may be especially helpful if you have IBS with constipation.  Avoid any foods and drinks that make your symptoms worse. These may include dairy products and caffeinated or carbonated drinks.  Do not eat large meals.  Drink enough fluid to keep your urine clear or pale yellow.  Exercise regularly. Ask your health care  provider for recommendations of good activities for you.  Keep all follow-up visits as directed by your health care provider. This is important. SEEK MEDICAL CARE IF:   You have constant pain.  You have trouble or pain with swallowing.  You have worsening diarrhea. SEEK IMMEDIATE MEDICAL CARE IF:   You have severe and worsening abdominal pain.   You have diarrhea and:   You have a rash, stiff neck, or severe headache.   You are irritable, sleepy, or difficult to awaken.   You are weak, dizzy, or extremely thirsty.   You have bright red blood in your stool or you have black tarry stools.   You have unusual abdominal swelling that is painful.   You vomit continuously.   You vomit blood (hematemesis).   You have both abdominal pain and a fever.    This information is not intended to replace advice given to you by your health care provider. Make sure you discuss any  questions you have with your health care provider.   Document Released: 07/09/2005 Document Revised: 07/30/2014 Document Reviewed: 03/26/2014 Elsevier Interactive Patient Education 2016 [employer].  Tdap Vaccine (Tetanus, Diphtheria and Pertussis): What You Need to Know 1. Why get vaccinated? Tetanus, diphtheria and pertussis are very serious diseases. Tdap vaccine can protect [place] from these diseases. And, Tdap vaccine given to pregnant women can protect newborn babies against pertussis. TETANUS (Lockjaw) is rare in the [place] today. It causes painful muscle tightening and stiffness, usually all over the body.  It can lead to tightening of muscles in the head and neck so you can't open your mouth, swallow, or sometimes even breathe. Tetanus kills about 1 out of 10 people who are infected even after receiving the best medical care. DIPHTHERIA is also rare in the [place] today. It can cause a thick coating to form in the back of the throat.  It can lead to breathing problems, heart failure, paralysis, and death. PERTUSSIS (Whooping Cough) causes severe coughing spells, which can cause difficulty breathing, vomiting and disturbed sleep.  It can also lead to weight loss, incontinence, and rib fractures. Up to 2 in 100 adolescents and 5 in 100 adults with pertussis are hospitalized or have complications, which could include pneumonia or death. These diseases are caused by bacteria. Diphtheria and pertussis are spread from person to person through secretions from coughing or sneezing. Tetanus enters the body through cuts, scratches, or wounds. Before vaccines, as many as 200,000 cases of diphtheria, 200,000 cases of pertussis, and hundreds of cases of tetanus, were reported in the [place] each year. Since vaccination began, reports of cases for tetanus and diphtheria have dropped by about 99% and for pertussis by about 80%. 2. Tdap vaccine Tdap vaccine can protect adolescents and  adults from tetanus, diphtheria, and pertussis. One dose of Tdap is routinely given at age 36 or 74. People who did not get Tdap at that age should get it as soon as possible. Tdap is especially important for healthcare professionals and anyone having close contact with a baby younger than 12 months. Pregnant women should get a dose of Tdap during every pregnancy, to protect the newborn from pertussis. Infants are most at risk for severe, life-threatening complications from pertussis. Another vaccine, called Td, protects against tetanus and diphtheria, but not pertussis. A Td booster should be given every 10 years. Tdap may be given as one of these boosters if you have never gotten Tdap before. Tdap may also be given  after a severe cut or burn to prevent tetanus infection. Your doctor or the person giving you the vaccine can give you more information. Tdap may safely be given at the same time as other vaccines. 3. Some people should not get this vaccine  A person who has ever had a life-threatening allergic reaction after a previous dose of any diphtheria, tetanus or pertussis containing vaccine, OR has a severe allergy to any part of this vaccine, should not get Tdap vaccine. Tell the person giving the vaccine about any severe allergies.  Anyone who had coma or long repeated seizures within 7 days after a childhood dose of DTP or DTaP, or a previous dose of Tdap, should not get Tdap, unless a cause other than the vaccine was found. They can still get Td.  Talk to your doctor if you:  have seizures or another nervous system problem,  had severe pain or swelling after any vaccine containing diphtheria, tetanus or pertussis,  ever had a condition called Guillain-Barr Syndrome (GBS),  aren't feeling well on the day the shot is scheduled. 4. Risks With any medicine, including vaccines, there is a chance of side effects. These are usually mild and go away on their own. Serious reactions are also  possible but are rare. Most people who get Tdap vaccine do not have any problems with it. Mild problems following Tdap (Did not interfere with activities)  Pain where the shot was given (about 3 in 4 adolescents or 2 in 3 adults)  Redness or swelling where the shot was given (about 1 person in 5)  Mild fever of at least 100.65F (up to about 1 in 25 adolescents or 1 in 100 adults)  Headache (about 3 or 4 people in 10)  Tiredness (about 1 person in 3 or 4)  Nausea, vomiting, diarrhea, stomach ache (up to 1 in 4 adolescents or 1 in 10 adults)  Chills, sore joints (about 1 person in 10)  Body aches (about 1 person in 3 or 4)  Rash, swollen glands (uncommon) Moderate problems following Tdap (Interfered with activities, but did not require medical attention)  Pain where the shot was given (up to 1 in 5 or 6)  Redness or swelling where the shot was given (up to about 1 in 16 adolescents or 1 in 12 adults)  Fever over 102F (about 1 in 100 adolescents or 1 in 250 adults)  Headache (about 1 in 7 adolescents or 1 in 10 adults)  Nausea, vomiting, diarrhea, stomach ache (up to 1 or 3 people in 100)  Swelling of the entire arm where the shot was given (up to about 1 in 500). Severe problems following Tdap (Unable to perform usual activities; required medical attention)  Swelling, severe pain, bleeding and redness in the arm where the shot was given (rare). Problems that could happen after any vaccine:  People sometimes faint after a medical procedure, including vaccination. Sitting or lying down for about 15 minutes can help prevent fainting, and injuries caused by a fall. Tell your doctor if you feel dizzy, or have vision changes or ringing in the ears.  Some people get severe pain in the shoulder and have difficulty moving the arm where a shot was given. This happens very rarely.  Any medication can cause a severe allergic reaction. Such reactions from a vaccine are very rare,  estimated at fewer than 1 in a million doses, and would happen within a few minutes to a few hours after the vaccination. As  with any medicine, there is a very remote chance of a vaccine causing a serious injury or death. The safety of vaccines is always being monitored. For more information, visit: http://www.aguilar.org/ 5. What if there is a serious problem? What should I look for?  Look for anything that concerns you, such as signs of a severe allergic reaction, very high fever, or unusual behavior.  Signs of a severe allergic reaction can include hives, swelling of the face and throat, difficulty breathing, a fast heartbeat, dizziness, and weakness. These would usually start a few minutes to a few hours after the vaccination. What should I do?  If you think it is a severe allergic reaction or other emergency that can't wait, call 9-1-1 or get the person to the nearest hospital. Otherwise, call your doctor.  Afterward, the reaction should be reported to the Vaccine Adverse Event Reporting System (VAERS). Your doctor might file this report, or you can do it yourself through the VAERS web site at www.vaers.SamedayNews.es, or by calling 226-290-5871. VAERS does not give medical advice.  6. The National Vaccine Injury Compensation Program The [employer] Vaccine Injury Compensation Program (VICP) is a federal program that was created to compensate people who may have been injured by certain vaccines. Persons who believe they may have been injured by a vaccine can learn about the program and about filing a claim by calling 289 631 2821 or visiting the [place] website at GoldCloset.com.ee. There is a time limit to file a claim for compensation. 7. How can I learn more?  Ask your doctor. He or she can give you the vaccine package insert or suggest other sources of information.  Call your local or state health department.  Contact the Centers for Disease Control and Prevention  (CDC):  Call 719-198-3790 (1-800-CDC-INFO) or  Visit CDC's website at http://hunter.com/ CDC Tdap Vaccine VIS (09/15/13)   This information is not intended to replace advice given to you by your health care provider. Make sure you discuss any questions you have with your health care provider.   Document Released: 01/08/2012 Document Revised: 07/30/2014 Document Reviewed: 10/21/2013 Elsevier Interactive Patient Education [employer].

## 2016-04-04 ENCOUNTER — Telehealth: Payer: Self-pay

## 2016-04-04 NOTE — Telephone Encounter (Signed)
Contacted pt to go over lab results pt did not answer and vm not available

## 2016-04-05 ENCOUNTER — Telehealth: Payer: Self-pay

## 2016-04-05 NOTE — Telephone Encounter (Signed)
Contacted pt to go over lab results pt is aware and doesn't have any questions or concerns 

## 2016-05-07 ENCOUNTER — Other Ambulatory Visit: Payer: Self-pay | Admitting: Internal Medicine

## 2016-05-29 ENCOUNTER — Ambulatory Visit: Payer: Self-pay | Attending: Internal Medicine

## 2016-06-11 ENCOUNTER — Encounter: Payer: Self-pay | Admitting: Physician Assistant

## 2016-06-11 ENCOUNTER — Ambulatory Visit: Payer: Self-pay | Attending: Internal Medicine | Admitting: Physician Assistant

## 2016-06-11 VITALS — BP 161/79 | HR 51 | Temp 98.1°F | Resp 16 | Wt 157.4 lb

## 2016-06-11 DIAGNOSIS — Z90710 Acquired absence of both cervix and uterus: Secondary | ICD-10-CM | POA: Insufficient documentation

## 2016-06-11 DIAGNOSIS — N939 Abnormal uterine and vaginal bleeding, unspecified: Secondary | ICD-10-CM | POA: Insufficient documentation

## 2016-06-11 DIAGNOSIS — I10 Essential (primary) hypertension: Secondary | ICD-10-CM | POA: Insufficient documentation

## 2016-06-11 MED ORDER — HYDROCHLOROTHIAZIDE 25 MG PO TABS: 25.0000 mg | ORAL_TABLET | Freq: Every day | ORAL | 3 refills | Status: DC

## 2016-06-11 MED FILL — HYDROCHLOROTHIAZIDE 25 MG T: 25 | 30 days supply | Qty: 30 | Fill #0

## 2016-06-11 NOTE — Progress Notes (Signed)
Pt is in the office today for spotting Pt states she is not in any pain Pt states she is not spotting right now Pt states the last time she spotted was a month ago Pt states she is not talking the bp medicine that was prescribed

## 2016-06-11 NOTE — Progress Notes (Signed)
Shannon [name], is a 60 y.o. female  [id number]  [id number]  DOB - 08-31-1955  Subjective:  Chief Complaint and HPI: Shannon [name] is a 60 y.o. female here today for an episode of post-coital bleeding about 1 month ago.  This happened about 8-9 months ago as well.  She has had a hysterectomy and still has ovaries.  It is unclear if she still has a cervix. She denies abdominal/pelvic pain.  No dysuria.  No f/c.  Last MMG about 1 yr ago and normal.  She has not been taking her BP meds for over a month. She does not have any complaints related to htn.  ROS:   Constitutional:  No f/c, No night sweats, No unexplained weight loss. EENT:  No vision changes, No blurry vision, No hearing changes. No mouth, throat, or ear problems.  Respiratory: No cough, No SOB Cardiac: No CP, no palpitations GI:  No abd pain, No N/V/D. GU: No Urinary s/sx Musculoskeletal: No joint pain Neuro: No headache, no dizziness, no motor weakness.  Skin: No rash Endocrine:  No polydipsia. No polyuria.  Psych: Denies SI/HI  No problems updated.  ALLERGIES: No Known Allergies  PAST MEDICAL HISTORY: Past Medical History:  Diagnosis Date  . Hypertension     MEDICATIONS AT HOME: Prior to Admission medications   Medication Sig Start Date End Date Taking? Authorizing Provider  Ascorbic Acid (VITAMIN C) 1000 MG tablet Take 1,000 mg by mouth once a week.    Historical Provider, MD  bacitracin-polymyxin b (POLYSPORIN) ophthalmic ointment Place 1 application into both eyes every 12 (twelve) hours. apply to eye every 12 hours while awake Patient not taking: Reported on 06/11/2016 10/11/14   Lorayne Marek, MD  carbamide peroxide (DEBROX) 6.5 % otic solution Place 5 drops into both ears 2 (two) times daily. Patient not taking: Reported on 06/11/2016 10/11/14   Lorayne Marek, MD  citalopram (CELEXA) 10 MG tablet Take 1 tablet (10 mg total) by mouth daily. 04/02/16   Maren Reamer, MD  COD LIVER OIL PO Take 1  capsule by mouth once a week.    Historical Provider, MD  Coenzyme Q10 (CO Q 10) 100 MG CAPS Take 1 capsule by mouth daily.    Historical Provider, MD  famotidine (PEPCID) 20 MG tablet Take 1 tablet (20 mg total) by mouth 2 (two) times daily. 04/02/16 04/02/17  Maren Reamer, MD  Homeopathic Products (CACTUS COMPOSITUM PO) Take 1 capsule by mouth once a week. Reported on 10/10/2015    Historical Provider, MD  hydrochlorothiazide (HYDRODIURIL) 25 MG tablet Take 1 tablet (25 mg total) by mouth daily. 06/11/16   Argentina Donovan, PA-C  hydrocortisone 2.5 % cream Apply topically 2 (two) times daily. Patient not taking: Reported on 06/11/2016 10/11/14   Lorayne Marek, MD  hyoscyamine (LEVSIN/SL) 0.125 MG SL tablet Place 1 tablet (0.125 mg total) under the tongue every 6 (six) hours as needed. Patient not taking: Reported on 06/11/2016 04/02/16   Maren Reamer, MD  metroNIDAZOLE (FLAGYL) 500 MG tablet Take 1 tablet (500 mg total) by mouth 2 (two) times daily. X 7 days Patient not taking: Reported on 06/11/2016 04/02/16   Maren Reamer, MD  omeprazole (PRILOSEC) 40 MG capsule Take 1 capsule (40 mg total) by mouth daily. 01/10/15   Lorayne Marek, MD  saccharomyces boulardii (FLORASTOR) 250 MG capsule Take 1 capsule (250 mg total) by mouth 2 (two) times daily. 04/02/16   Maren Reamer, MD  Objective:  EXAM:   Vitals:   06/11/16 1007  BP: (!) 161/79  Pulse: (!) 51  Resp: 16  Temp: 98.1 F (36.7 C)  TempSrc: Oral  SpO2: 99%  Weight: 157 lb 6.4 oz (71.4 kg)    General appearance : A&OX3. NAD. Non-toxic-appearing HEENT: Atraumatic and Normocephalic.  EOM intact.   Chest/Lungs:  Breathing-non-labored, Good air entry bilaterally, breath sounds normal without rales, rhonchi, or wheezing  CVS: S1 S2 regular, no murmurs, gallops, rubs  Abdomen: Bowel sounds present, Non tender and not distended with no gaurding, rigidity or rebound Neurology:  CN II-XII grossly intact, Non focal.   Psych:   TP linear. J/I WNL. Normal speech. Appropriate eye contact and affect.  Skin:  No Rash  Data Review Lab Results  Component Value Date   HGBA1C 5.3 10/11/2014     Assessment & Plan   1. Abnormal vaginal bleeding For now, encouraged lubricants such as astroglide to reduce friction/risk of bleeding.  - Ambulatory referral to Gynecology  2. HTN (hypertension), benign Check BP OOO and record and bring to next visit - hydrochlorothiazide (HYDRODIURIL) 25 MG tablet; Take 1 tablet (25 mg total) by mouth daily.  Dispense: 90 tablet; Refill: 3     Patient have been counseled extensively about nutrition and exercise  Return in about 6 weeks (around 07/23/2016) for with Dr Janne Napoleon for BP f/up.  The patient was given clear instructions to go to ER or return to medical center if symptoms don't improve, worsen or new problems develop. The patient verbalized understanding. The patient was told to call to get lab results if they haven't heard anything in the next week.     Freeman Caldron, PA-C [facility] [place], [place]   06/11/2016, 10:30 AMPatient ID: Shannon [name], female   DOB: 1956/03/07, 60 y.o.   MRN: [id number]

## 2016-06-11 NOTE — Patient Instructions (Signed)
Check Blood pressure 2-3 times/week and record.  Bring to next office visit.

## 2016-07-05 ENCOUNTER — Other Ambulatory Visit: Payer: Self-pay | Admitting: Obstetrics and Gynecology

## 2016-07-05 DIAGNOSIS — Z1231 Encounter for screening mammogram for malignant neoplasm of breast: Secondary | ICD-10-CM

## 2016-07-19 MED FILL — HYDROCHLOROTHIAZIDE 25 MG T: 25 | 30 days supply | Qty: 30 | Fill #1

## 2016-07-31 ENCOUNTER — Ambulatory Visit (HOSPITAL_COMMUNITY)
Admission: RE | Admit: 2016-07-31 | Discharge: 2016-07-31 | Disposition: A | Discharge status: Home / Self Care | Payer: Self-pay | Source: Ambulatory Visit | Attending: Obstetrics and Gynecology | Admitting: Obstetrics and Gynecology

## 2016-07-31 ENCOUNTER — Ambulatory Visit
Admission: RE | Admit: 2016-07-31 | Discharge: 2016-07-31 | Disposition: A | Discharge status: Home / Self Care | Payer: No Typology Code available for payment source | Source: Ambulatory Visit | Attending: Obstetrics and Gynecology | Admitting: Obstetrics and Gynecology

## 2016-07-31 ENCOUNTER — Encounter (HOSPITAL_COMMUNITY): Payer: Self-pay

## 2016-07-31 VITALS — BP 150/100 | Temp 97.6°F | Ht 65.0 in | Wt 160.8 lb

## 2016-07-31 DIAGNOSIS — Z1239 Encounter for other screening for malignant neoplasm of breast: Secondary | ICD-10-CM

## 2016-07-31 DIAGNOSIS — Z1231 Encounter for screening mammogram for malignant neoplasm of breast: Secondary | ICD-10-CM

## 2016-07-31 NOTE — Patient Instructions (Signed)
Explained breast self awareness to [name]. Patient did not need a Pap smear today due to her history of a hysterectomy for benign reasons. Informed patient that she doesn't need any further Pap smears due to her history of a hysterectomy for benign reasons. Referred patient to the [facility] for a screening mammogram. Appointment scheduled for Tuesday, July 31, 2016 at 1030. Let patient know the [facility] will follow up with her within the next couple weeks with results of mammogram by letter or phone. Shannon [name] verbalized understanding.  [name], Arvil [place], RN 9:16 AM

## 2016-07-31 NOTE — Progress Notes (Addendum)
No complaints today.   Pap Smear: Pap smear not completed today. Patient is unsure when her last Pap smear was completed. Per patient has no history of an abnormal Pap smear. Patient has a hysterectomy completed 08/24/2004 due to AUB. Patient doesn't need any further Pap smears due to her history of a hysterectomy for benign reasons per BCCCP and ACOG guidelines. No Pap smear results are in EPIC. Operative report for hysterectomy is in EPIC.  Physical exam: Breasts Breasts symmetrical. No skin abnormalities bilateral breasts. No nipple retraction bilateral breasts. No nipple discharge bilateral breasts. No lymphadenopathy. No lumps palpated bilateral breasts. No complaints of pain or tenderness on exam. Referred patient to the [facility] for a screening mammogram. Appointment scheduled for Tuesday, July 31, 2016 at 1030.        Pelvic/Bimanual No Pap smear completed today since patient has a history of a hysterectomy for benign reasons. Pap smear not indicated per BCCCP guidelines.   Smoking History: Patient is a former smoker that quit 08/01/2015. Patient smoked 0.25 packs per day for 20 years.  Patient Navigation: Patient education provided. Access to services provided for patient through [facility] program.   Colorectal Cancer Screening: Patient had a colonoscopy completed 09/05/2015. No complaints today.

## 2016-07-31 NOTE — Addendum Note (Signed)
Encounter addended by: Loletta Parish, RN on: 07/31/2016  9:34 AM<BR>    Actions taken: Sign clinical note

## 2016-08-01 ENCOUNTER — Encounter (HOSPITAL_COMMUNITY): Payer: Self-pay | Admitting: *Deleted

## 2016-08-16 ENCOUNTER — Encounter: Payer: No Typology Code available for payment source | Admitting: Obstetrics & Gynecology

## 2016-08-27 ENCOUNTER — Encounter: Payer: Self-pay | Admitting: Obstetrics & Gynecology

## 2016-08-27 ENCOUNTER — Ambulatory Visit (INDEPENDENT_AMBULATORY_CARE_PROVIDER_SITE_OTHER): Payer: Self-pay | Admitting: Obstetrics & Gynecology

## 2016-08-27 DIAGNOSIS — N950 Postmenopausal bleeding: Secondary | ICD-10-CM | POA: Insufficient documentation

## 2016-08-27 DIAGNOSIS — N930 Postcoital and contact bleeding: Secondary | ICD-10-CM

## 2016-08-27 DIAGNOSIS — Z113 Encounter for screening for infections with a predominantly sexual mode of transmission: Secondary | ICD-10-CM

## 2016-08-27 MED ORDER — ESTRADIOL 0.1 MG/GM VA CREA: TOPICAL_CREAM | VAGINAL | 2 refills | Status: DC

## 2016-08-27 MED FILL — HYDROCHLOROTHIAZIDE 25 MG T: 25 | 30 days supply | Qty: 30 | Fill #0

## 2016-08-27 NOTE — Progress Notes (Signed)
Patient ID: Shannon [name], female   DOB: [birth year], 61 y.o.   MRN: [id number]  Chief Complaint  Patient presents with  . Vaginal Bleeding  few episodes of postcoital bleeding, most recent 10-17  HPI Shannon [name] is a 61 y.o. female.  [id number], No LMP recorded. Patient has had a hysterectomy. Post coital vaginal bleeding in October, not currently sexually active.   HPI  Past Medical History:  Diagnosis Date  . Hypertension     Past Surgical History:  Procedure Laterality Date  . ABDOMINAL HYSTERECTOMY    . CESAREAN SECTION     x2  . PARTIAL HYSTERECTOMY      Family History  Problem Relation Age of Onset  . Colon cancer Father 18  . Cancer Father     Social History Social History  Substance Use Topics  . Smoking status: Former Smoker    Packs/day: 0.25    Years: 20.00    Types: Cigarettes    Quit date: 08/01/2015  . Smokeless tobacco: Never Used  . Alcohol use 3.6 oz/week    6 Shots of liquor per week     Comment: 2-3 drinks per night of weekends per pt.    No Known Allergies  Current Outpatient Prescriptions  Medication Sig Dispense Refill  . Cholecalciferol (VITAMIN D) 2000 units tablet Take 2,000 Units by mouth once a week.    . COD LIVER OIL PO Take 1 capsule by mouth once a week.    . Coenzyme Q10 (CO Q 10) 100 MG CAPS Take 1 capsule by mouth daily.    . Homeopathic Products (CACTUS COMPOSITUM PO) Take 1 capsule by mouth once a week. Reported on 10/10/2015    . hydrochlorothiazide (HYDRODIURIL) 25 MG tablet Take 1 tablet (25 mg total) by mouth daily. 90 tablet 3  . Ascorbic Acid (VITAMIN C) 1000 MG tablet Take 1,000 mg by mouth once a week.    . bacitracin-polymyxin b (POLYSPORIN) ophthalmic ointment Place 1 application into both eyes every 12 (twelve) hours. apply to eye every 12 hours while awake (Patient not taking: Reported on 07/31/2016) 3.5 g 0  . carbamide peroxide (DEBROX) 6.5 % otic solution Place 5 drops into both ears 2 (two) times daily. (Patient  not taking: Reported on 07/31/2016) 15 mL 1  . citalopram (CELEXA) 10 MG tablet Take 1 tablet (10 mg total) by mouth daily. (Patient not taking: Reported on 07/31/2016) 30 tablet 2  . estradiol (ESTRACE VAGINAL) 0.1 MG/GM vaginal cream 1/2 applicator per vagina every night for 2 weeks then 3 times a week for a total of 8 weeks 42.5 g 2  . famotidine (PEPCID) 20 MG tablet Take 1 tablet (20 mg total) by mouth 2 (two) times daily. (Patient not taking: Reported on 07/31/2016) 60 tablet 1  . hydrocortisone 2.5 % cream Apply topically 2 (two) times daily. (Patient not taking: Reported on 07/31/2016) 30 g 0  . hyoscyamine (LEVSIN/SL) 0.125 MG SL tablet Place 1 tablet (0.125 mg total) under the tongue every 6 (six) hours as needed. (Patient not taking: Reported on 07/31/2016) 30 tablet 0  . metroNIDAZOLE (FLAGYL) 500 MG tablet Take 1 tablet (500 mg total) by mouth 2 (two) times daily. X 7 days (Patient not taking: Reported on 07/31/2016) 14 tablet 0  . omeprazole (PRILOSEC) 40 MG capsule Take 1 capsule (40 mg total) by mouth daily. (Patient not taking: Reported on 07/31/2016) 30 capsule 3  . saccharomyces boulardii (FLORASTOR) 250 MG capsule Take 1 capsule (250 mg  total) by mouth 2 (two) times daily. (Patient not taking: Reported on 07/31/2016) 30 capsule 2   No current facility-administered medications for this visit.     Review of Systems Review of Systems  Gastrointestinal: Negative.   Genitourinary: Negative for pelvic pain, urgency, vaginal bleeding, vaginal discharge and vaginal pain.    Blood pressure 128/68, pulse 62, weight 160 lb 6.4 oz (72.8 kg).  Physical Exam Physical Exam  Constitutional: She appears well-developed. No distress.  Cardiovascular: Normal rate.   Pulmonary/Chest: Effort normal. No respiratory distress.  Genitourinary: No vaginal discharge (minimal vaginal atrophy and no lesion, cuff intact) found.  Skin: Skin is warm and dry.  Psychiatric: She has a normal mood and affect. Her  behavior is normal.    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 Cholecalciferol (VITAMIN D) 2000 units tablet Take 2,000 Units by mouth once a week.  . COD LIVER OIL PO Take 1 capsule by mouth once a week.  . Coenzyme Q10 (CO Q 10) 100 MG CAPS Take 1 capsule by mouth daily.  . Homeopathic Products (CACTUS COMPOSITUM PO) Take 1 capsule by mouth once a week. Reported on 10/10/2015  . hydrochlorothiazide (HYDRODIURIL) 25 MG tablet Take 1 tablet (25 mg total) by mouth daily.  . Ascorbic Acid (VITAMIN C) 1000 MG tablet Take 1,000 mg by mouth once a week.  . bacitracin-polymyxin b (POLYSPORIN) ophthalmic ointment Place 1 application into both eyes every 12 (twelve) hours. apply to eye every 12 hours while awake (Patient not taking: Reported on 07/31/2016)  . carbamide peroxide (DEBROX) 6.5 % otic solution Place 5 drops into both ears 2 (two) times daily. (Patient not taking: Reported on 07/31/2016)  . citalopram (CELEXA) 10 MG tablet Take 1 tablet (10 mg total) by mouth daily. (Patient not taking: Reported on 07/31/2016)  . estradiol (ESTRACE VAGINAL) 0.1 MG/GM vaginal cream 1/2 applicator per vagina every night for 2 weeks then 3 times a week for a total of 8 weeks  . famotidine (PEPCID) 20 MG tablet Take 1 tablet (20 mg total) by mouth 2 (two) times daily. (Patient not taking: Reported on 07/31/2016)  . hydrocortisone 2.5 % cream Apply topically 2 (two) times daily. (Patient not taking: Reported on 07/31/2016)  . hyoscyamine (LEVSIN/SL) 0.125 MG SL tablet Place 1 tablet (0.125 mg total) under the tongue every 6 (six) hours as needed. (Patient not taking: Reported on 07/31/2016)  . metroNIDAZOLE (FLAGYL) 500 MG tablet Take 1 tablet (500 mg total) by mouth 2 (two) times daily. X 7 days (Patient not taking: Reported on 07/31/2016)  . omeprazole (PRILOSEC) 40 MG capsule Take 1 capsule  (40 mg total) by mouth daily. (Patient not taking: Reported on 07/31/2016)  . saccharomyces boulardii (FLORASTOR) 250 MG capsule Take 1 capsule (250 mg total) by mouth 2 (two) times daily. (Patient not taking: Reported on 07/31/2016)   No facility-administered encounter medications on file as of 08/27/2016.    Estrace cream for 8 weeks, wet prep sent today       Emeterio Reeve 08/27/2016, 2:36 PM

## 2016-08-27 NOTE — Patient Instructions (Signed)
Atrophic Vaginitis Introduction Atrophic vaginitis is when the tissues that line the vagina become dry and thin. This is caused by a drop in estrogen. Estrogen helps:  To keep the vagina moist.  To make a clear fluid that helps:  To lubricate the vagina for sex.  To protect the vagina from infection. If the lining of the vagina is dry and thin, it may:  Make sex painful. It may also cause bleeding.  Cause a feeling of:  Burning.  Irritation.  Itchiness.  Make an exam of your vagina painful. It may also cause bleeding.  Make you lose interest in sex.  Cause a burning feeling when you pee.  Make your vaginal fluid (discharge) brown or yellow. For some women, there are no symptoms. This condition is most common in women who do not get their regular menstrual periods anymore (menopause). This often starts when a woman is 56-23 years old. Follow these instructions at home:  Take medicines only as told by your doctor. Do not use any herbal or alternative medicines unless your doctor says it is okay.  Use over-the-counter products for dryness only as told by your doctor. These include:  Creams.  Lubricants.  Moisturizers.  Do not douche.  Do not use products that can make your vagina dry. These include:  Scented feminine sprays.  Scented tampons.  Scented soaps.  If it hurts to have sex, tell your sexual partner. Contact a doctor if:  Your discharge looks different than normal.  Your vagina has an unusual smell.  You have new symptoms.  Your symptoms do not get better with treatment.  Your symptoms get worse. This information is not intended to replace advice given to you by your health care provider. Make sure you discuss any questions you have with your health care provider. Document Released: 12/26/2007 Document Revised: 12/15/2015 Document Reviewed: 06/30/2014  2017 Elsevier

## 2016-08-28 LAB — GC/CHLAMYDIA PROBE AMP (~~LOC~~) NOT AT ARMC
CHLAMYDIA, DNA PROBE: NEGATIVE
Neisseria Gonorrhea: NEGATIVE

## 2016-08-29 ENCOUNTER — Telehealth: Payer: Self-pay | Admitting: *Deleted

## 2016-08-29 MED FILL — !ESTRACE 0.01% CREAM: 0.01% | 56 days supply | Qty: 48 | Fill #0

## 2016-08-29 NOTE — Telephone Encounter (Signed)
Tiffani called office stating she went to [facility] pharmacy to get her estrace cream twice and they say they didn't get the order. Per chart review Quynn saw Dr. Roselie Awkward 08/27/16 and he ordered estrace but I can not verify order went thru. I called [facility] and was unable to speak to a pharmacist so I left the order on their voicemail.  I called [facility] and notified her I had called in order and to allow them time to fill order. She voices understanding.

## 2016-10-01 MED FILL — HYDROCHLOROTHIAZIDE 25 MG T: 25 | 30 days supply | Qty: 30 | Fill #1

## 2016-11-05 MED FILL — HYDROCHLOROTHIAZIDE 25 MG T: 25 | 30 days supply | Qty: 30 | Fill #2

## 2016-11-19 ENCOUNTER — Ambulatory Visit: Payer: Self-pay | Attending: Internal Medicine

## 2016-12-04 MED FILL — HYDROCHLOROTHIAZIDE 25 MG T: 25 | 30 days supply | Qty: 30 | Fill #3

## 2016-12-06 ENCOUNTER — Encounter: Payer: Self-pay | Admitting: Internal Medicine

## 2016-12-07 ENCOUNTER — Encounter: Payer: Self-pay | Admitting: Internal Medicine

## 2016-12-10 ENCOUNTER — Encounter: Payer: Self-pay | Admitting: Internal Medicine

## 2016-12-11 ENCOUNTER — Encounter: Payer: Self-pay | Admitting: Internal Medicine

## 2016-12-11 ENCOUNTER — Ambulatory Visit: Payer: Self-pay | Attending: Internal Medicine | Admitting: Internal Medicine

## 2016-12-11 VITALS — BP 131/85 | HR 68 | Temp 97.7°F | Resp 16 | Ht 65.0 in | Wt 157.8 lb

## 2016-12-11 DIAGNOSIS — I10 Essential (primary) hypertension: Secondary | ICD-10-CM | POA: Insufficient documentation

## 2016-12-11 DIAGNOSIS — H6123 Impacted cerumen, bilateral: Secondary | ICD-10-CM

## 2016-12-11 DIAGNOSIS — J029 Acute pharyngitis, unspecified: Secondary | ICD-10-CM | POA: Insufficient documentation

## 2016-12-11 DIAGNOSIS — J069 Acute upper respiratory infection, unspecified: Secondary | ICD-10-CM | POA: Insufficient documentation

## 2016-12-11 DIAGNOSIS — Z131 Encounter for screening for diabetes mellitus: Secondary | ICD-10-CM

## 2016-12-11 DIAGNOSIS — J302 Other seasonal allergic rhinitis: Secondary | ICD-10-CM

## 2016-12-11 DIAGNOSIS — R05 Cough: Secondary | ICD-10-CM | POA: Insufficient documentation

## 2016-12-11 LAB — POCT GLYCOSYLATED HEMOGLOBIN (HGB A1C): HEMOGLOBIN A1C: 5.2

## 2016-12-11 MED ORDER — LORATADINE 10 MG PO TABS: 10.0000 mg | ORAL_TABLET | Freq: Every day | ORAL | 11 refills | Status: DC

## 2016-12-11 MED ORDER — FLUTICASONE PROPIONATE 50 MCG/ACT NA SUSP: 2.0000 | Freq: Every day | NASAL | 6 refills | Status: DC

## 2016-12-11 MED ORDER — CARBAMIDE PEROXIDE 6.5 % OT SOLN: 5.0000 [drp] | Freq: Two times a day (BID) | OTIC | 0 refills | Status: DC

## 2016-12-11 MED FILL — EARWAX TREATMENT 6.5% DROPS: 6.5 | 15 days supply | Qty: 15 | Fill #0

## 2016-12-11 MED FILL — FLUTICASONE PROP 50 MCG SPR: 50 | 30 days supply | Qty: 16 | Fill #0

## 2016-12-11 NOTE — Progress Notes (Signed)
Shannon [name], is a 61 y.o. female  [id number]  [id number]  DOB - 08-May-1956  Chief Complaint  Patient presents with  . URI    ST;Cough-dry;Nasal - yellow x 2 wks        Subjective:   Shannon [name] is a 61 y.o. female here today for a follow up visit, last seen in clinic 06/11/16. Overall doing well, but c/o of nasal congestion, postnasal drip x 10 days, feels "like crap", subjective chills.  Nonproductive cough, and sore throat more so w/ the nasal drip and cough.  No fevers. She notes here friend had strep throat recently, and her dgt was "sick". She currently does not smoke.  Denies runny eyes./pruritis.  She has tried zyrtec at home, also tried theraflu, niquil, vit c, and saline nasal spray w/o relief.  Patient has No headache, No chest pain, No abdominal pain - No Nausea, No new weakness tingling or numbness, No Cough - SOB.  No problems updated.  ALLERGIES: No Known Allergies  PAST MEDICAL HISTORY: Past Medical History:  Diagnosis Date  . Hypertension     MEDICATIONS AT HOME: Prior to Admission medications   Medication Sig Start Date End Date Taking? Authorizing Provider  Ascorbic Acid (VITAMIN C) 1000 MG tablet Take 1,000 mg by mouth once a week.   Yes [provider]  Cholecalciferol (VITAMIN D) 2000 units tablet Take 2,000 Units by mouth once a week.   Yes [provider]  COD LIVER OIL PO Take 1 capsule by mouth once a week.   Yes [provider]  Coenzyme Q10 (CO Q 10) 100 MG CAPS Take 1 capsule by mouth daily.   Yes [provider]  hydrochlorothiazide (HYDRODIURIL) 25 MG tablet Take 1 tablet (25 mg total) by mouth daily. 06/11/16  Yes McClung, Dionne Bucy, PA-C  carbamide peroxide (DEBROX) 6.5 % otic solution Place 5 drops into the right ear 2 (two) times daily. 12/11/16   Maren Reamer, MD  citalopram (CELEXA) 10 MG tablet Take 1 tablet (10 mg total) by mouth daily. Patient not taking: Reported on 07/31/2016 04/02/16    Lottie Mussel T, MD  estradiol (ESTRACE VAGINAL) 0.1 MG/GM vaginal cream 1/2 applicator per vagina every night for 2 weeks then 3 times a week for a total of 8 weeks Patient not taking: Reported on 12/11/2016 08/27/16   Woodroe Mode, MD  famotidine (PEPCID) 20 MG tablet Take 1 tablet (20 mg total) by mouth 2 (two) times daily. Patient not taking: Reported on 07/31/2016 04/02/16 04/02/17  Maren Reamer, MD  fluticasone [facility]) 50 MCG/ACT nasal spray Place 2 sprays into both nostrils daily. 12/11/16   Maren Reamer, MD  Homeopathic Products (CACTUS COMPOSITUM PO) Take 1 capsule by mouth once a week. Reported on 10/10/2015    [provider]  hydrocortisone 2.5 % cream Apply topically 2 (two) times daily. Patient not taking: Reported on 07/31/2016 10/11/14   Lorayne Marek, MD  hyoscyamine (LEVSIN/SL) 0.125 MG SL tablet Place 1 tablet (0.125 mg total) under the tongue every 6 (six) hours as needed. Patient not taking: Reported on 07/31/2016 04/02/16   Maren Reamer, MD  loratadine (CLARITIN) 10 MG tablet Take 1 tablet (10 mg total) by mouth daily. 12/11/16   Maren Reamer, MD  metroNIDAZOLE (FLAGYL) 500 MG tablet Take 1 tablet (500 mg total) by mouth 2 (two) times daily. X 7 days Patient not taking: Reported on 07/31/2016 04/02/16   Maren Reamer, MD  omeprazole (  PRILOSEC) 40 MG capsule Take 1 capsule (40 mg total) by mouth daily. Patient not taking: Reported on 07/31/2016 01/10/15   Lorayne Marek, MD  saccharomyces boulardii (FLORASTOR) 250 MG capsule Take 1 capsule (250 mg total) by mouth 2 (two) times daily. Patient not taking: Reported on 07/31/2016 04/02/16   Lottie Mussel T, MD     Objective:   Vitals:   12/11/16 1545  BP: 131/85  Pulse: 68  Resp: 16  Temp: 97.7 F (36.5 C)  TempSrc: Oral  SpO2: 98%  Weight: 157 lb 12.8 oz (71.6 kg)  Height: 5\' 5"  (1.651 m)    Exam General appearance : Awake, alert, not in any distress. Speech Clear. Not toxic looking, pleasant.   HEENT: Atraumatic and Normocephalic, pupils equally reactive to light.  bilat ceruminosis impaction noted.  bilat nares boggy. O/p clear, no cobblestoning, no erythema/enlarged tonsils/exudate.  No ttp max/facial sinuses Neck: supple, no JVD. No cervical lymphadenopathy.  Chest:Good air entry bilaterally, no added sounds. CVS: S1 S2 regular, no murmurs/gallups or rubs. Abdomen: Bowel sounds active, Non tender Extremities: B/L Lower Ext shows no edema, both legs are warm to touch Neurology: Awake alert, and oriented X 3, CN II-XII grossly intact, Non focal Skin:No Rash  Data Review Lab Results  Component Value Date   HGBA1C 5.2 12/11/2016   HGBA1C 5.3 10/11/2014    Depression screen [facility] 2/9 12/11/2016 06/11/2016 04/02/2016 10/10/2015  Decreased Interest 0 0 0 0  Down, Depressed, Hopeless 0 0 0 0  PHQ - 2 Score 0 0 0 0  Altered sleeping 0 - - -  Tired, decreased energy 0 - - -  Change in appetite 0 - - -  Feeling bad or failure about yourself  0 - - -  Trouble concentrating 0 - - -  Moving slowly or fidgety/restless 0 - - -  Suicidal thoughts 0 - - -  PHQ-9 Score 0 - - -      Assessment & Plan   1. Seasonal allergic rhinitis, unspecified trigger - continue zyrterc, if too strong, trial clartin 10mg  qd - added flonase nasal spray. - if sxs worsen next 1- 2 wks, w/o improvement, call us and may need abx for possible infection.  2. Diabetes mellitus screening - HgB A1c 5.2  3. Ceruminosis, bilateral - debrox gtt - rn [facility] visit 2 wks for bilat ear irrgation. - Ear Lavage - future order     Patient have been counseled extensively about nutrition and exercise  Return in about 3 months (around 03/13/2017), or if symptoms worsen or fail to improve.  The patient was given clear instructions to go to ER or return to medical center if symptoms don't improve, worsen or new problems develop. The patient verbalized understanding. The patient was told to call to get lab results  if they haven't heard anything in the next week.   This note has been created with Surveyor, quantity. Any transcriptional errors are unintentional.   Maren Reamer, MD, [facility], [place]   12/11/2016, 4:14 PM

## 2016-12-11 NOTE — Patient Instructions (Addendum)
2wk RN Carilyn Goodpasture appt for bilat ear irrigation   Allergies An allergy is when your body reacts to a substance in a way that is not normal. An allergic reaction can happen after you:  Eat something.  Breathe in something.  Touch something. You can be allergic to:  Things that are only around during certain seasons, like molds and pollens.  Foods.  Drugs.  Insects.  Animal dander. What are the signs or symptoms?  Puffiness (swelling). This may happen on the lips, face, tongue, mouth, or throat.  Sneezing.  Coughing.  Breathing loudly (wheezing).  Stuffy nose.  Tingling in the mouth.  A rash.  Itching.  Itchy, red, puffy areas of skin (hives).  Watery eyes.  Throwing up (vomiting).  Watery poop (diarrhea).  Dizziness.  Feeling faint or fainting.  Trouble breathing or swallowing.  A tight feeling in the chest.  A fast heartbeat. How is this diagnosed? Allergies can be diagnosed with:  A medical and family history.  Skin tests.  Blood tests.  A food diary. A food diary is a record of all the foods, drinks, and symptoms you have each day.  The results of an elimination diet. This diet involves making sure not to eat certain foods and then seeing what happens when you start eating them again. How is this treated? There is no cure for allergies, but allergic reactions can be treated with medicine. Severe reactions usually need to be treated at a hospital. How is this prevented? The best way to prevent an allergic reaction is to avoid the thing you are allergic to. Allergy shots and medicines can also help prevent reactions in some cases. This information is not intended to replace advice given to you by your health care provider. Make sure you discuss any questions you have with your health care provider. Document Released: 11/03/2012 Document Revised: 03/05/2016 Document Reviewed: 04/20/2014 Elsevier Interactive Patient Education  2017 [employer].

## 2016-12-24 ENCOUNTER — Ambulatory Visit: Payer: Self-pay | Attending: Internal Medicine | Admitting: *Deleted

## 2016-12-24 ENCOUNTER — Ambulatory Visit: Payer: Self-pay

## 2016-12-24 DIAGNOSIS — H6120 Impacted cerumen, unspecified ear: Secondary | ICD-10-CM

## 2016-12-24 NOTE — Progress Notes (Signed)
Minimum amount of ceruminosis is noted. Irrigation by elephant ear device utilized. Pt tolerated procedure well.

## 2016-12-31 MED FILL — HYDROCHLOROTHIAZIDE 25 MG T: 25 | 30 days supply | Qty: 30 | Fill #4

## 2016-12-31 MED FILL — FLUTICASONE PROP 50 MCG SPR: 50 | 30 days supply | Qty: 16 | Fill #1

## 2017-02-11 MED FILL — HYDROCHLOROTHIAZIDE 25 MG T: 25 | 30 days supply | Qty: 30 | Fill #5

## 2017-02-26 ENCOUNTER — Ambulatory Visit: Payer: Self-pay | Attending: Internal Medicine

## 2017-03-11 ENCOUNTER — Ambulatory Visit: Payer: Self-pay | Attending: Internal Medicine | Admitting: Internal Medicine

## 2017-03-11 ENCOUNTER — Encounter: Payer: Self-pay | Admitting: Internal Medicine

## 2017-03-11 VITALS — BP 130/78 | HR 55 | Temp 98.4°F | Resp 16 | Wt 159.0 lb

## 2017-03-11 DIAGNOSIS — K589 Irritable bowel syndrome without diarrhea: Secondary | ICD-10-CM | POA: Insufficient documentation

## 2017-03-11 DIAGNOSIS — Z87891 Personal history of nicotine dependence: Secondary | ICD-10-CM | POA: Insufficient documentation

## 2017-03-11 DIAGNOSIS — Z79899 Other long term (current) drug therapy: Secondary | ICD-10-CM | POA: Insufficient documentation

## 2017-03-11 DIAGNOSIS — K219 Gastro-esophageal reflux disease without esophagitis: Secondary | ICD-10-CM | POA: Insufficient documentation

## 2017-03-11 DIAGNOSIS — I10 Essential (primary) hypertension: Secondary | ICD-10-CM | POA: Insufficient documentation

## 2017-03-11 NOTE — Patient Instructions (Signed)
Try to increase exercise/walking to 3 times a week for 30 minutes.

## 2017-03-11 NOTE — Progress Notes (Signed)
Patient ID: Shannon [name], female    DOB: Jun 15, 1956  MRN: [id number]  CC: re-establish and Hypertension   Subjective: Shannon [name] is a 61 y.o. female who presents to re-est with me as PCP.  Last seen 11/2016 by Dr. Clide Dales Her concerns today include:  Pt with hx of HTN, IBS (on Celexa for this with overlay of anxiety), GERD, seasonal allergies  1. HTN: compliant with HCTZ. Did not take as yet -limits salt . No CP/SOB/:LE edema  2. IBS/GERD: Not taking Levsin or Pepcid. Using Tumeric once a day OTC which help keeps GI symptoms controlled.  Moving bowels ok, no blood in stools -takes Prilosec 20 mg PRN OTC  3.  Walking once a wk Tries to make healthy food choices  Patient Active Problem List   Diagnosis Date Noted  . Postmenopausal postcoital bleeding 08/27/2016  . HTN (hypertension), benign 04/02/2016  . Vitamin D deficiency 10/10/2015  . Chronic knee pain 10/11/2014  . Eczema 10/11/2014  . Excess ear wax 10/11/2014  . History of right knee surgery 10/11/2014     Current Outpatient Prescriptions on File Prior to Visit  Medication Sig Dispense Refill  . Ascorbic Acid (VITAMIN C) 1000 MG tablet Take 1,000 mg by mouth once a week.    . Cholecalciferol (VITAMIN D) 2000 units tablet Take 2,000 Units by mouth once a week.    . COD LIVER OIL PO Take 1 capsule by mouth once a week.    . Coenzyme Q10 (CO Q 10) 100 MG CAPS Take 1 capsule by mouth daily.    . fluticasone (FLONASE) 50 MCG/ACT nasal spray Place 2 sprays into both nostrils daily. 16 g 6  . Homeopathic Products (CACTUS COMPOSITUM PO) Take 1 capsule by mouth once a week. Reported on 10/10/2015    . hydrochlorothiazide (HYDRODIURIL) 25 MG tablet Take 1 tablet (25 mg total) by mouth daily. 90 tablet 3  . loratadine (CLARITIN) 10 MG tablet Take 1 tablet (10 mg total) by mouth daily. 30 tablet 11  . omeprazole (PRILOSEC) 40 MG capsule Take 1 capsule (40 mg total) by mouth daily. (Patient not taking: Reported on  07/31/2016) 30 capsule 3   No current facility-administered medications on file prior to visit.     No Known Allergies  Social History   Social History  . Marital status: Single    Spouse name: N/A  . Number of children: N/A  . Years of education: N/A   Occupational History  . Not on file.   Social History Main Topics  . Smoking status: Former Smoker    Packs/day: 0.25    Years: 20.00    Types: Cigarettes    Quit date: 08/01/2015  . Smokeless tobacco: Never Used  . Alcohol use 3.6 oz/week    6 Shots of liquor per week     Comment: 2-3 drinks per night of weekends per pt.  . Drug use: No  . Sexual activity: Not on file   Other Topics Concern  . Not on file   Social History Narrative  . No narrative on file    Family History  Problem Relation Age of Onset  . Colon cancer Father 72  . Cancer Father     Past Surgical History:  Procedure Laterality Date  . ABDOMINAL HYSTERECTOMY    . CESAREAN SECTION     x2  . PARTIAL HYSTERECTOMY     TAH    ROS: Review of Systems Negative except as stated above PHYSICAL  EXAM: BP 130/78   Pulse (!) 55   Temp 98.4 F (36.9 C) (Oral)   Resp 16   Wt 159 lb (72.1 kg)   SpO2 98%   BMI 26.46 kg/m    Physical Exam General appearance - alert, well appearing, and in no distress Mental status - alert, oriented to person, place, and time, normal mood, behavior, speech, dress, motor activity, and thought processes Mouth - mucous membranes moist, pharynx normal without lesions Neck - supple, no significant adenopathy Chest - clear to auscultation, no wheezes, rales or rhonchi, symmetric air entry Heart - normal rate, regular rhythm, normal S1, S2, no murmurs, rubs, clicks or gallops Extremities - peripheral pulses normal, no pedal edema, no clubbing or cyanosis  Depression screen Shannon [name] 2/9 03/11/2017 12/11/2016 06/11/2016 04/02/2016 10/10/2015  Decreased Interest 0 0 0 0 0  Down, Depressed, Hopeless 0 0 0 0 0  PHQ - 2 Score 0 0 0 0  0  Altered sleeping - 0 - - -  Tired, decreased energy - 0 - - -  Change in appetite - 0 - - -  Feeling bad or failure about yourself  - 0 - - -  Trouble concentrating - 0 - - -  Moving slowly or fidgety/restless - 0 - - -  Suicidal thoughts - 0 - - -  PHQ-9 Score - 0 - - -    ASSESSMENT AND PLAN: 1. HTN (hypertension), benign At goal. Continue current medication HCTZ  2. Gastroesophageal reflux disease without esophagitis Continue omeprazole when necessary. GERD precautions reinforced  3. Irritable bowel syndrome without diarrhea Patient seems to be doing well with CAM in the form of Tumeric supplement.  Patient was given the opportunity to ask questions.  Patient verbalized understanding of the plan and was able to repeat key elements of the plan.   No orders of the defined types were placed in this encounter.    Requested Prescriptions    No prescriptions requested or ordered in this encounter    Return in about 4 months (around 07/11/2017).  Karle Plumber, MD, FACP

## 2017-03-23 MED FILL — HYDROCHLOROTHIAZIDE 25 MG T: 25 | 30 days supply | Qty: 30 | Fill #6

## 2017-04-22 ENCOUNTER — Telehealth: Payer: Self-pay | Admitting: Internal Medicine

## 2017-04-22 DIAGNOSIS — I10 Essential (primary) hypertension: Secondary | ICD-10-CM

## 2017-04-22 MED ORDER — HYDROCHLOROTHIAZIDE 25 MG PO TABS: 25.0000 mg | ORAL_TABLET | Freq: Every day | ORAL | 0 refills | Status: DC

## 2017-04-22 MED FILL — HYDROCHLOROTHIAZIDE 25 MG T: 25 | 30 days supply | Qty: 30 | Fill #2

## 2017-04-22 NOTE — Telephone Encounter (Signed)
HCTZ refilled. 

## 2017-04-22 NOTE — Telephone Encounter (Signed)
Patient called requesting more refills for hydrochlorothiazide (HYDRODIURIL) 25 MG tablet  Pt will not have enough refills to last for 12/18. Please f/up

## 2017-05-20 ENCOUNTER — Ambulatory Visit: Payer: Self-pay

## 2017-05-20 ENCOUNTER — Encounter: Payer: Self-pay | Admitting: Internal Medicine

## 2017-05-20 ENCOUNTER — Ambulatory Visit: Payer: Self-pay | Attending: Internal Medicine | Admitting: Internal Medicine

## 2017-05-20 VITALS — BP 135/81 | HR 59 | Temp 98.2°F | Resp 16 | Wt 160.0 lb

## 2017-05-20 DIAGNOSIS — K589 Irritable bowel syndrome without diarrhea: Secondary | ICD-10-CM | POA: Insufficient documentation

## 2017-05-20 DIAGNOSIS — Z9889 Other specified postprocedural states: Secondary | ICD-10-CM | POA: Insufficient documentation

## 2017-05-20 DIAGNOSIS — M25569 Pain in unspecified knee: Secondary | ICD-10-CM | POA: Insufficient documentation

## 2017-05-20 DIAGNOSIS — I10 Essential (primary) hypertension: Secondary | ICD-10-CM | POA: Insufficient documentation

## 2017-05-20 DIAGNOSIS — Z2821 Immunization not carried out because of patient refusal: Secondary | ICD-10-CM | POA: Insufficient documentation

## 2017-05-20 DIAGNOSIS — G8929 Other chronic pain: Secondary | ICD-10-CM | POA: Insufficient documentation

## 2017-05-20 DIAGNOSIS — M722 Plantar fascial fibromatosis: Secondary | ICD-10-CM | POA: Insufficient documentation

## 2017-05-20 DIAGNOSIS — Z809 Family history of malignant neoplasm, unspecified: Secondary | ICD-10-CM | POA: Insufficient documentation

## 2017-05-20 DIAGNOSIS — Z87891 Personal history of nicotine dependence: Secondary | ICD-10-CM | POA: Insufficient documentation

## 2017-05-20 DIAGNOSIS — E559 Vitamin D deficiency, unspecified: Secondary | ICD-10-CM | POA: Insufficient documentation

## 2017-05-20 DIAGNOSIS — Z79899 Other long term (current) drug therapy: Secondary | ICD-10-CM | POA: Insufficient documentation

## 2017-05-20 DIAGNOSIS — Z800 Family history of malignant neoplasm of digestive organs: Secondary | ICD-10-CM | POA: Insufficient documentation

## 2017-05-20 DIAGNOSIS — Z90711 Acquired absence of uterus with remaining cervical stump: Secondary | ICD-10-CM | POA: Insufficient documentation

## 2017-05-20 DIAGNOSIS — K219 Gastro-esophageal reflux disease without esophagitis: Secondary | ICD-10-CM | POA: Insufficient documentation

## 2017-05-20 NOTE — Progress Notes (Signed)
Patient ID: Shannon [name], female    DOB: 11-19-1955  MRN: [id number]  CC: No chief complaint on file.   Subjective: Shannon [name] is a 61 y.o. female who presents for UC Her concerns today include:  Pt with hx of HTN, IBS (on Celexa for this with overlay of anxiety), GERD, seasonal allergies  1. Patient complains of little hard balls forming on the sole of the right foot times several months. One also starting on the sole of the left foot. -No known injury. 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 HTN: Compliant with HCTZ. No chest pains or shortness of breath  HM: Due for flu shot. Patient declines flu shot today  Patient Active Problem List   Diagnosis Date Noted  . Irritable bowel syndrome without diarrhea 03/11/2017  . Gastroesophageal reflux disease without esophagitis 03/11/2017  . Postmenopausal postcoital bleeding 08/27/2016  . HTN (hypertension), benign 04/02/2016  . Vitamin D deficiency 10/10/2015  . Chronic knee pain 10/11/2014  . Eczema 10/11/2014  . History of right knee surgery 10/11/2014     Current Outpatient Prescriptions on File Prior to Visit  Medication Sig Dispense Refill  . Ascorbic Acid (VITAMIN C) 1000 MG tablet Take 1,000 mg by mouth once a week.    . Cholecalciferol (VITAMIN D) 2000 units tablet Take 2,000 Units by mouth once a week.    . COD LIVER OIL PO Take 1 capsule by mouth once a week.    . Coenzyme Q10 (CO Q 10) 100 MG CAPS Take 1 capsule by mouth daily.    . fluticasone (FLONASE) 50 MCG/ACT nasal spray Place 2 sprays into both nostrils daily. 16 g 6  . Homeopathic Products (CACTUS COMPOSITUM PO) Take 1 capsule by mouth once a week. Reported on 10/10/2015    . hydrochlorothiazide (HYDRODIURIL) 25 MG tablet Take 1 tablet (25 mg total) by mouth  daily. 90 tablet 0  . loratadine (CLARITIN) 10 MG tablet Take 1 tablet (10 mg total) by mouth daily. 30 tablet 11  . omeprazole (PRILOSEC) 40 MG capsule Take 1 capsule (40 mg total) by mouth daily. (Patient not taking: Reported on 07/31/2016) 30 capsule 3   No current facility-administered medications on file prior to visit.     No Known Allergies  Social History   Social History  . Marital status: Single    Spouse name: N/A  . Number of children: N/A  . Years of education: N/A   Occupational History  . Not on file.   Social History Main Topics  . Smoking status: Former Smoker    Packs/day: 0.25    Years: 20.00    Types: Cigarettes    Quit date: 08/01/2015  . Smokeless tobacco: Never Used  . Alcohol use 3.6 oz/week    6 Shots of liquor per week     Comment: 2-3 drinks per night of weekends per pt.  . Drug use: No  . Sexual activity: Not on file   Other Topics Concern  . Not on file   Social History Narrative  . No narrative on file    Family History  Problem Relation Age of Onset  . Colon cancer Father 32  . Cancer Father     Past Surgical History:  Procedure Laterality Date  . ABDOMINAL HYSTERECTOMY    .  CESAREAN SECTION     x2  . PARTIAL HYSTERECTOMY     TAH    ROS: Review of Systems Negative except as stated above PHYSICAL EXAM: BP 135/81   Pulse (!) 59   Temp 98.2 F (36.8 C) (Oral)   Resp 16   Wt 160 lb (72.6 kg)   SpO2 98%   BMI 26.63 kg/m   Physical Exam Gen.-middle-age Caucasian female in NAD Chest-clear to auscultation bilaterally Heart-regular rate and rhythm no gallops or murmurs Feet-5 small firm nodular lesions on the sole of the right foot. One felt and the instep of the left foot   ASSESSMENT AND PLAN: 1. Plantar fascial fibromatosis of both feet - Ambulatory referral to Podiatry  2. Influenza vaccination declined   3. Essential hypertension At goal. Continue HCTZ   Patient was given the opportunity to ask questions.   Patient verbalized understanding of the plan and was able to repeat key elements of the plan.   Orders Placed This Encounter  Procedures  . Ambulatory referral to Podiatry     Requested Prescriptions    No prescriptions requested or ordered in this encounter    Return in about 3 months (around 08/20/2017).  Karle Plumber, MD, FACP

## 2017-05-27 MED FILL — FLUTICASONE PROP 50 MCG SPR: 50 | 30 days supply | Qty: 16 | Fill #2

## 2017-05-27 MED FILL — HYDROCHLOROTHIAZIDE 25 MG T: 25 | 30 days supply | Qty: 30 | Fill #3

## 2017-06-03 ENCOUNTER — Encounter: Payer: Self-pay | Admitting: Podiatry

## 2017-06-03 ENCOUNTER — Ambulatory Visit: Payer: No Typology Code available for payment source | Admitting: Podiatry

## 2017-06-03 ENCOUNTER — Ambulatory Visit: Payer: No Typology Code available for payment source

## 2017-06-03 ENCOUNTER — Other Ambulatory Visit: Payer: Self-pay | Admitting: Podiatry

## 2017-06-03 DIAGNOSIS — M79671 Pain in right foot: Secondary | ICD-10-CM

## 2017-06-03 DIAGNOSIS — M79672 Pain in left foot: Principal | ICD-10-CM

## 2017-06-03 DIAGNOSIS — L989 Disorder of the skin and subcutaneous tissue, unspecified: Secondary | ICD-10-CM

## 2017-06-03 DIAGNOSIS — M722 Plantar fascial fibromatosis: Secondary | ICD-10-CM

## 2017-06-03 NOTE — Progress Notes (Signed)
   Subjective: Patient presents to the office today for chief complaint of painful callus lesions of the feet. Patient states that the pain is ongoing and is affecting their ability to ambulate without pain. Patient states that they are intermittently painful on occasion. For the most part they're not symptomatic. Lesions are most consistent on the medial longitudinal arch of the right foot. She states that she has been using over-the-counter [place] callus remover with some improvement of symptoms.Patient presents today for further treatment and evaluation.  Objective:  Physical Exam General: Alert and oriented x3 in no acute distress  Dermatology: Hyperkeratotic lesion present on the medial longitudinal arch of the rightfoot. Lesions appear well adhered to the dermal tissue consistent with a possible dermatofibroma Pain on palpation with a central nucleated core noted.  Skin is warm, dry and supple bilateral lower extremities. Negative for open lesions or macerations.  Vascular: Palpable pedal pulses bilaterally. No edema or erythema noted. Capillary refill within normal limits.  Neurological: Epicritic and protective threshold grossly intact bilaterally.   Musculoskeletal Exam: Pain on palpation at the keratotic lesion noted. Range of motion within normal limits bilateral. Muscle strength 5/5 in all groups bilateral.  Assessment: #1 multiple overlying skin lesions right foot 4, each approximately 1 cm in diameter   Plan of Care:  #1 Patient evaluated #2 recommend the patient continue the over-the-counter Cor[place] callus remover #3 continue silicone padding when necessary #4 return to clinic when necessary  Edrick Kins, DPM [facility]  Dr. Edrick Kins, [address]                                        [place], [place] [address]                Office [phone]  Fax [phone]

## 2017-06-27 ENCOUNTER — Other Ambulatory Visit: Payer: Self-pay | Admitting: Physician Assistant

## 2017-06-27 DIAGNOSIS — I10 Essential (primary) hypertension: Secondary | ICD-10-CM

## 2017-06-27 MED FILL — HYDROCHLOROTHIAZIDE 25 MG T: 25 | 30 days supply | Qty: 30 | Fill #0

## 2017-06-28 ENCOUNTER — Other Ambulatory Visit: Payer: Self-pay | Admitting: Physician Assistant

## 2017-06-28 DIAGNOSIS — I10 Essential (primary) hypertension: Secondary | ICD-10-CM

## 2017-07-26 ENCOUNTER — Other Ambulatory Visit: Payer: Self-pay | Admitting: Internal Medicine

## 2017-07-26 DIAGNOSIS — I10 Essential (primary) hypertension: Secondary | ICD-10-CM

## 2017-07-26 MED FILL — HYDROCHLOROTHIAZIDE 25 MG T: 25 | 30 days supply | Qty: 30 | Fill #0

## 2017-08-05 ENCOUNTER — Other Ambulatory Visit: Payer: Self-pay | Admitting: Obstetrics and Gynecology

## 2017-08-05 DIAGNOSIS — Z1231 Encounter for screening mammogram for malignant neoplasm of breast: Secondary | ICD-10-CM

## 2017-08-29 ENCOUNTER — Encounter (HOSPITAL_COMMUNITY): Payer: Self-pay

## 2017-08-29 ENCOUNTER — Ambulatory Visit
Admission: RE | Admit: 2017-08-29 | Discharge: 2017-08-29 | Disposition: A | Discharge status: Home / Self Care | Payer: No Typology Code available for payment source | Source: Ambulatory Visit | Attending: Obstetrics and Gynecology | Admitting: Obstetrics and Gynecology

## 2017-08-29 ENCOUNTER — Ambulatory Visit (HOSPITAL_COMMUNITY)
Admission: RE | Admit: 2017-08-29 | Discharge: 2017-08-29 | Disposition: A | Discharge status: Home / Self Care | Payer: Self-pay | Source: Ambulatory Visit | Attending: Obstetrics and Gynecology | Admitting: Obstetrics and Gynecology

## 2017-08-29 VITALS — BP 120/82 | Ht 65.0 in

## 2017-08-29 DIAGNOSIS — Z1231 Encounter for screening mammogram for malignant neoplasm of breast: Secondary | ICD-10-CM

## 2017-08-29 DIAGNOSIS — Z1239 Encounter for other screening for malignant neoplasm of breast: Secondary | ICD-10-CM

## 2017-08-29 NOTE — Progress Notes (Signed)
No complaints today.   Pap Smear: Pap smear not completed today. Last Pap smear was 09/10/2008 and normal. Per patient has no history of an abnormal Pap smear. Patient has a history of a hysterectomy completed 08/24/2004 due to AUB. Patient doesn't need any further Pap smears due to her history of a hysterectomy for benign reasons per BCCCP and ACOG guidelines. Last Pap smear result is in Epic.  Physical exam: Breasts Breasts symmetrical. No skin abnormalities bilateral breasts. No nipple retraction bilateral breasts. No nipple discharge bilateral breasts. No lymphadenopathy. No lumps palpated bilateral breasts. No complaints of pain or tenderness on exam. Referred patient to the [facility] for a screening mammogram. Appointment scheduled for Thursday, August 29, 2016 at 0910.      Pelvic/Bimanual No Pap smear completed today since patient has a history of a hysterectomy for benign reasons. Pap smear not indicated per BCCCP guidelines.   Smoking History: Patient is a former smoker that quit 08/01/2015. Patient smoked 0.25 packs per day for 20 years.  Patient Navigation: Patient education provided. Access to services provided for patient through [facility] program.   Colorectal Cancer Screening: Patient had a colonoscopy completed 09/05/2015. No complaints today. FIT Test given to patient to complete and return to B[facility]CP.

## 2017-08-29 NOTE — Patient Instructions (Addendum)
Explained breast self awareness with Shannon [name] Patient did not need a Pap smear today due to her history of a hysterectomy for benign reasons. Informed patient that she doesn't need any further Pap smears due to her history of a hysterectomy for benign reasons. Referred patient to the [facility] for a screening mammogram. Appointment scheduled for Thursday, August 29, 2016 at 0910. Let [facility] know the [facility] will follow up with her within the next couple weeks with results of mammogram by letter or phone. [name] verbalized understanding.  Tahesha Skeet, Arvil Chaco, RN 9:26 AM

## 2017-08-30 ENCOUNTER — Encounter (HOSPITAL_COMMUNITY): Payer: Self-pay | Admitting: *Deleted

## 2017-09-02 ENCOUNTER — Other Ambulatory Visit: Payer: Self-pay | Admitting: Internal Medicine

## 2017-09-02 DIAGNOSIS — I10 Essential (primary) hypertension: Secondary | ICD-10-CM

## 2017-09-02 MED FILL — HYDROCHLOROTHIAZIDE 25 MG T: 25 | 30 days supply | Qty: 30 | Fill #0

## 2017-10-02 ENCOUNTER — Other Ambulatory Visit: Payer: Self-pay | Admitting: Internal Medicine

## 2017-10-02 DIAGNOSIS — I10 Essential (primary) hypertension: Secondary | ICD-10-CM

## 2017-10-03 MED FILL — HYDROCHLOROTHIAZIDE 25 MG T: 25 | 30 days supply | Qty: 30 | Fill #0

## 2017-11-11 ENCOUNTER — Ambulatory Visit: Payer: Self-pay | Attending: Internal Medicine | Admitting: Internal Medicine

## 2017-11-11 ENCOUNTER — Encounter: Payer: Self-pay | Admitting: Internal Medicine

## 2017-11-11 ENCOUNTER — Other Ambulatory Visit: Payer: Self-pay | Admitting: Internal Medicine

## 2017-11-11 VITALS — BP 127/79 | HR 67 | Temp 98.4°F | Resp 16 | Wt 165.4 lb

## 2017-11-11 DIAGNOSIS — I10 Essential (primary) hypertension: Secondary | ICD-10-CM

## 2017-11-11 DIAGNOSIS — E559 Vitamin D deficiency, unspecified: Secondary | ICD-10-CM | POA: Insufficient documentation

## 2017-11-11 DIAGNOSIS — K589 Irritable bowel syndrome without diarrhea: Secondary | ICD-10-CM | POA: Insufficient documentation

## 2017-11-11 DIAGNOSIS — Z90711 Acquired absence of uterus with remaining cervical stump: Secondary | ICD-10-CM | POA: Insufficient documentation

## 2017-11-11 DIAGNOSIS — Z87891 Personal history of nicotine dependence: Secondary | ICD-10-CM | POA: Insufficient documentation

## 2017-11-11 DIAGNOSIS — Z79899 Other long term (current) drug therapy: Secondary | ICD-10-CM | POA: Insufficient documentation

## 2017-11-11 DIAGNOSIS — F419 Anxiety disorder, unspecified: Secondary | ICD-10-CM | POA: Insufficient documentation

## 2017-11-11 DIAGNOSIS — M722 Plantar fascial fibromatosis: Secondary | ICD-10-CM

## 2017-11-11 DIAGNOSIS — K219 Gastro-esophageal reflux disease without esophagitis: Secondary | ICD-10-CM | POA: Insufficient documentation

## 2017-11-11 DIAGNOSIS — J302 Other seasonal allergic rhinitis: Secondary | ICD-10-CM

## 2017-11-11 MED ORDER — FLUTICASONE PROPIONATE 50 MCG/ACT NA SUSP: 2.0000 | Freq: Every day | NASAL | 6 refills | Status: DC

## 2017-11-11 MED ORDER — HYDROCHLOROTHIAZIDE 25 MG PO TABS: 25.0000 mg | ORAL_TABLET | Freq: Every day | ORAL | 1 refills | Status: DC

## 2017-11-11 MED FILL — FLUTICASONE PROP 50 MCG SPR: 50 | 30 days supply | Qty: 16 | Fill #0

## 2017-11-11 MED FILL — HYDROCHLOROTHIAZIDE 25 MG T: 25 | 90 days supply | Qty: 90 | Fill #0

## 2017-11-11 NOTE — Progress Notes (Signed)
Patient ID: Shannon [name], female    DOB: 01-16-1956  MRN: [id number]  CC: Hypertension   Subjective: Shannon [name] is a 62 y.o. female who presents for chronic ds management. Her concerns today include:  Pt with hx of HTN, IBS (on Celexa for this with overlay of anxiety), GERD, seasonal allergies  1.  Saw Dr. Amalia Hailey for the balls on soles of feet.  Had x-rays. Told they are not cancerous.  2.  C/o inc allergy symptoms.  Symptoms are cough and rhinorrhea.  Takes Zyretec, Flonase and Sudafed.  Needs RF on Flonase  3.  HTN:  Compliant with HCTZ.  Denies any chest pains or shortness of breath at rest on exertion.  She limits salt in the foods.  Patient Active Problem List   Diagnosis Date Noted  . Seasonal allergies 11/11/2017  . Plantar fascial fibromatosis of both feet 05/20/2017  . Irritable bowel syndrome without diarrhea 03/11/2017  . Gastroesophageal reflux disease without esophagitis 03/11/2017  . Postmenopausal postcoital bleeding 08/27/2016  . HTN (hypertension), benign 04/02/2016  . Vitamin D deficiency 10/10/2015  . Chronic knee pain 10/11/2014  . Eczema 10/11/2014  . History of right knee surgery 10/11/2014     Current Outpatient Medications on File Prior to Visit  Medication Sig Dispense Refill  . Ascorbic Acid (VITAMIN C) 1000 MG tablet Take 1,000 mg by mouth once a week.    . Cholecalciferol (VITAMIN D) 2000 units tablet Take 2,000 Units by mouth once a week.    . COD LIVER OIL PO Take 1 capsule by mouth once a week.    . Coenzyme Q10 (CO Q 10) 100 MG CAPS Take 1 capsule by mouth daily.    . Homeopathic Products (CACTUS COMPOSITUM PO) Take 1 capsule by mouth once a week. Reported on 10/10/2015    . omeprazole (PRILOSEC) 40 MG capsule Take 1 capsule (40 mg total) by mouth daily. 30 capsule 3   No current facility-administered medications on file prior to visit.     No Known Allergies  Social History   Socioeconomic History  . Marital status: Single   Spouse name: Not on file  . Number of children: Not on file  . Years of education: Not on file  . Highest education level: Not on file  Occupational History  . Not on file  Social Needs  . Financial resource strain: Not on file  . Food insecurity:    Worry: Not on file    Inability: Not on file  . Transportation needs:    Medical: Not on file    Non-medical: Not on file  Tobacco Use  . Smoking status: Former Smoker    Packs/day: 0.25    Years: 20.00    Pack years: 5.00    Types: Cigarettes    Last attempt to quit: 08/01/2015    Years since quitting: 2.2  . Smokeless tobacco: Never Used  Substance and Sexual Activity  . Alcohol use: Yes    Alcohol/week: 3.6 oz    Types: 6 Shots of liquor per week    Comment: 2-3 drinks per night of weekends per pt.  . Drug use: No  . Sexual activity: Not Currently    Birth control/protection: Surgical  Lifestyle  . Physical activity:    Days per week: 0 days    Minutes per session: 0 min  . Stress: Only a little  Relationships  . Social connections:    Talks on phone: More than three times a  week    Gets together: Once a week    Attends religious service: Never    Active member of club or organization: No    Attends meetings of clubs or organizations: Never    Relationship status: Never married  . Intimate partner violence:    Fear of current or ex partner: Patient refused    Emotionally abused: Patient refused    Physically abused: Patient refused    Forced sexual activity: Patient refused  Other Topics Concern  . Not on file  Social History Narrative  . Not on file    Family History  Problem Relation Age of Onset  . Colon cancer Father 30  . Cancer Father   . Cancer Maternal Grandmother     Past Surgical History:  Procedure Laterality Date  . ABDOMINAL HYSTERECTOMY    . BREAST EXCISIONAL BIOPSY Bilateral    benign  . CESAREAN SECTION     x2  . PARTIAL HYSTERECTOMY     TAH    ROS: Review of Systems Could have  except as stated above. PHYSICAL EXAM: BP 127/79   Pulse 67   Temp 98.4 F (36.9 C) (Oral)   Resp 16   Wt 165 lb 6.4 oz (75 kg)   SpO2 95%   BMI 27.52 kg/m   Physical Exam  General appearance - alert, well appearing, and in no distress Mental status - alert, oriented to person, place, and time Nose - normal and patent, no erythema, discharge or polyps Mouth - mucous membranes moist, pharynx normal without lesions Neck - supple, no significant adenopathy Chest - clear to auscultation, no wheezes, rales or rhonchi, symmetric air entry Heart - normal rate, regular rhythm, normal S1, S2, no murmurs, rubs, clicks or gallops Extremities - peripheral pulses normal, no pedal edema, no clubbing or cyanosis  Depression screen [facility] 2/9 11/11/2017  Decreased Interest 0  Down, Depressed, Hopeless 0  PHQ - 2 Score 0  Altered sleeping -  Tired, decreased energy -  Change in appetite -  Feeling bad or failure about yourself  -  Trouble concentrating -  Moving slowly or fidgety/restless -  Suicidal thoughts -  PHQ-9 Score -      ASSESSMENT AND PLAN: 1. Essential hypertension At goal.  Refill HCTZ.  Continue DASH diet. - hydrochlorothiazide (HYDRODIURIL) 25 MG tablet; Take 1 tablet (25 mg total) by mouth daily.  Dispense: 90 tablet; Refill: 1 - Comprehensive metabolic panel - CBC - Lipid panel  2. Plantar fascial fibromatosis of both feet -She has seen the podiatrist.  3. Seasonal allergies Refill Flonase.  Okay to take Zyrtec over-the-counter as she has been doing. - fluticasone (FLONASE) 50 MCG/ACT nasal spray; Place 2 sprays into both nostrils daily.  Dispense: 16 g; Refill: 6  Patient was given the opportunity to ask questions.  Patient verbalized understanding of the plan and was able to repeat key elements of the plan.   Orders Placed This Encounter  Procedures  . Comprehensive metabolic panel  . CBC  . Lipid panel     Requested Prescriptions   Signed Prescriptions  Disp Refills  . fluticasone (FLONASE) 50 MCG/ACT nasal spray 16 g 6    Sig: Place 2 sprays into both nostrils daily.  . hydrochlorothiazide (HYDRODIURIL) 25 MG tablet 90 tablet 1    Sig: Take 1 tablet (25 mg total) by mouth daily.    Return in about 4 months (around 03/13/2018).  Karle Plumber, MD, FACP

## 2017-11-11 NOTE — Patient Instructions (Signed)

## 2017-11-12 ENCOUNTER — Ambulatory Visit: Payer: Self-pay | Attending: Internal Medicine

## 2017-11-12 LAB — LIPID PANEL
CHOL/HDL RATIO: 4.3 ratio (ref 0.0–4.4)
Cholesterol, Total: 197 mg/dL (ref 100–199)
HDL: 46 mg/dL (ref >39)
LDL Calculated: 125 mg/dL — ABNORMAL HIGH (ref 0–99)
TRIGLYCERIDES: 130 mg/dL (ref 0–149)
VLDL Cholesterol Cal: 26 mg/dL (ref 5–40)

## 2017-11-12 LAB — COMPREHENSIVE METABOLIC PANEL
ALK PHOS: 96 IU/L (ref 39–117)
ALT: 24 IU/L (ref 0–32)
AST: 18 IU/L (ref 0–40)
Albumin/Globulin Ratio: 1.6 (ref 1.2–2.2)
Albumin: 4.4 g/dL (ref 3.6–4.8)
BILIRUBIN TOTAL: 0.3 mg/dL (ref 0.0–1.2)
BUN / CREAT RATIO: 19 (ref 12–28)
BUN: 13 mg/dL (ref 8–27)
CHLORIDE: 99 mmol/L (ref 96–106)
CO2: 27 mmol/L (ref 20–29)
Calcium: 9.7 mg/dL (ref 8.7–10.3)
Creatinine, Ser: 0.69 mg/dL (ref 0.57–1.00)
GFR calc non Af Amer: 95 mL/min/{1.73_m2} (ref >59)
GFR, EST AFRICAN AMERICAN: 109 mL/min/{1.73_m2} (ref >59)
GLOBULIN, TOTAL: 2.8 g/dL (ref 1.5–4.5)
Glucose: 97 mg/dL (ref 65–99)
Potassium: 3.5 mmol/L (ref 3.5–5.2)
SODIUM: 142 mmol/L (ref 134–144)
TOTAL PROTEIN: 7.2 g/dL (ref 6.0–8.5)

## 2017-11-12 LAB — CBC
HEMATOCRIT: 43.2 % (ref 34.0–46.6)
Hemoglobin: 14.3 g/dL (ref 11.1–15.9)
MCH: 29.4 pg (ref 26.6–33.0)
MCHC: 33.1 g/dL (ref 31.5–35.7)
MCV: 89 fL (ref 79–97)
PLATELETS: 229 10*3/uL (ref 150–379)
RBC: 4.87 x10E6/uL (ref 3.77–5.28)
RDW: 13.5 % (ref 12.3–15.4)
WBC: 9.3 10*3/uL (ref 3.4–10.8)

## 2017-11-13 ENCOUNTER — Telehealth: Payer: Self-pay

## 2017-11-13 NOTE — Telephone Encounter (Signed)
Contacted pt to go over lab results pt is aware and doesn't have any questions or concerns 

## 2018-02-07 MED FILL — HYDROCHLOROTHIAZIDE 25 MG T: 25 | 90 days supply | Qty: 90 | Fill #1

## 2018-03-10 ENCOUNTER — Encounter: Payer: Self-pay | Admitting: Internal Medicine

## 2018-03-10 ENCOUNTER — Ambulatory Visit: Payer: No Typology Code available for payment source | Admitting: Internal Medicine

## 2018-03-10 ENCOUNTER — Ambulatory Visit: Payer: Self-pay | Attending: Internal Medicine | Admitting: Internal Medicine

## 2018-03-10 VITALS — BP 135/76 | HR 58 | Temp 98.3°F | Resp 16 | Wt 168.0 lb

## 2018-03-10 DIAGNOSIS — L918 Other hypertrophic disorders of the skin: Secondary | ICD-10-CM | POA: Insufficient documentation

## 2018-03-10 DIAGNOSIS — M722 Plantar fascial fibromatosis: Secondary | ICD-10-CM | POA: Insufficient documentation

## 2018-03-10 DIAGNOSIS — Z800 Family history of malignant neoplasm of digestive organs: Secondary | ICD-10-CM | POA: Insufficient documentation

## 2018-03-10 DIAGNOSIS — Z87891 Personal history of nicotine dependence: Secondary | ICD-10-CM | POA: Insufficient documentation

## 2018-03-10 DIAGNOSIS — Z2821 Immunization not carried out because of patient refusal: Secondary | ICD-10-CM

## 2018-03-10 DIAGNOSIS — K219 Gastro-esophageal reflux disease without esophagitis: Secondary | ICD-10-CM | POA: Insufficient documentation

## 2018-03-10 DIAGNOSIS — F419 Anxiety disorder, unspecified: Secondary | ICD-10-CM | POA: Insufficient documentation

## 2018-03-10 DIAGNOSIS — L299 Pruritus, unspecified: Secondary | ICD-10-CM

## 2018-03-10 DIAGNOSIS — K589 Irritable bowel syndrome without diarrhea: Secondary | ICD-10-CM | POA: Insufficient documentation

## 2018-03-10 DIAGNOSIS — E559 Vitamin D deficiency, unspecified: Secondary | ICD-10-CM | POA: Insufficient documentation

## 2018-03-10 DIAGNOSIS — I10 Essential (primary) hypertension: Secondary | ICD-10-CM | POA: Insufficient documentation

## 2018-03-10 DIAGNOSIS — H6121 Impacted cerumen, right ear: Secondary | ICD-10-CM | POA: Insufficient documentation

## 2018-03-10 MED ORDER — LORATADINE 10 MG PO TABS: 10.0000 mg | ORAL_TABLET | Freq: Every day | ORAL | 2 refills | Status: DC

## 2018-03-10 MED ORDER — HYDROCHLOROTHIAZIDE 25 MG PO TABS: 25.0000 mg | ORAL_TABLET | Freq: Every day | ORAL | 2 refills | Status: DC

## 2018-03-10 MED ORDER — TRIAMCINOLONE ACETONIDE 0.1 % EX CREA: TOPICAL_CREAM | CUTANEOUS | 0 refills | Status: DC

## 2018-03-10 MED FILL — TRIAMCINOLONE ACETONIDE 0.1: 0.1 | 10 days supply | Qty: 15 | Fill #0

## 2018-03-10 MED FILL — ?LORATADINE 10 MG TABS: 10 | 30 days supply | Qty: 30 | Fill #0

## 2018-03-10 NOTE — Progress Notes (Signed)
Patient ID: Shannon [name], female    DOB: 04-16-1956  MRN: [id number]  CC: Hypertension   Subjective: Shannon [name] is a 62 y.o. female who presents for chronic ds management Her concerns today include:  Pt with hx of HTN, IBS (on Celexa for this with overlay of anxiety), GERD, seasonal allergies  C/o itching in ears which is chronic.  States she was given some type of steroid drop in the past which did not help.  Also saw ENT for it a long time ago and did not find that visit helpful. -has tried Calamine lotion and hydrocortisone cream.  She reports that sometimes it itches so much she would scratch until she bleeds  HTN:  Compliant with HCTZ.  No device to check.  Limits salt in foods, She tries to stay active.  HM: declines flu shot   Patient Active Problem List   Diagnosis Date Noted  . Seasonal allergies 11/11/2017  . Plantar fascial fibromatosis of both feet 05/20/2017  . Irritable bowel syndrome without diarrhea 03/11/2017  . Gastroesophageal reflux disease without esophagitis 03/11/2017  . Postmenopausal postcoital bleeding 08/27/2016  . HTN (hypertension), benign 04/02/2016  . Vitamin D deficiency 10/10/2015  . Chronic knee pain 10/11/2014  . Eczema 10/11/2014  . History of right knee surgery 10/11/2014     Current Outpatient Medications on File Prior to Visit  Medication Sig Dispense Refill  . Ascorbic Acid (VITAMIN C) 1000 MG tablet Take 1,000 mg by mouth once a week.    . Cholecalciferol (VITAMIN D) 2000 units tablet Take 2,000 Units by mouth once a week.    . COD LIVER OIL PO Take 1 capsule by mouth once a week.    . Coenzyme Q10 (CO Q 10) 100 MG CAPS Take 1 capsule by mouth daily.    . fluticasone (FLONASE) 50 MCG/ACT nasal spray Place 2 sprays into both nostrils daily. 16 g 6  . Homeopathic Products (CACTUS COMPOSITUM PO) Take 1 capsule by mouth once a week. Reported on 10/10/2015    . omeprazole (PRILOSEC) 40 MG capsule Take 1 capsule (40 mg total) by  mouth daily. 30 capsule 3   No current facility-administered medications on file prior to visit.     No Known Allergies  Social History   Socioeconomic History  . Marital status: Single    Spouse name: Not on file  . Number of children: Not on file  . Years of education: Not on file  . Highest education level: Not on file  Occupational History  . Not on file  Social Needs  . Financial resource strain: Not on file  . Food insecurity:    Worry: Not on file    Inability: Not on file  . Transportation needs:    Medical: Not on file    Non-medical: Not on file  Tobacco Use  . Smoking status: Former Smoker    Packs/day: 0.25    Years: 20.00    Pack years: 5.00    Types: Cigarettes    Last attempt to quit: 08/01/2015    Years since quitting: 2.6  . Smokeless tobacco: Never Used  Substance and Sexual Activity  . Alcohol use: Yes    Alcohol/week: 6.0 standard drinks    Types: 6 Shots of liquor per week    Comment: 2-3 drinks per night of weekends per pt.  . Drug use: No  . Sexual activity: Not Currently    Birth control/protection: Surgical  Lifestyle  . Physical activity:  Days per week: 0 days    Minutes per session: 0 min  . Stress: Only a little  Relationships  . Social connections:    Talks on phone: More than three times a week    Gets together: Once a week    Attends religious service: Never    Active member of club or organization: No    Attends meetings of clubs or organizations: Never    Relationship status: Never married  . Intimate partner violence:    Fear of current or ex partner: Patient refused    Emotionally abused: Patient refused    Physically abused: Patient refused    Forced sexual activity: Patient refused  Other Topics Concern  . Not on file  Social History Narrative  . Not on file    Family History  Problem Relation Age of Onset  . Colon cancer Father 54  . Cancer Father   . Cancer Maternal Grandmother     Past Surgical History:    Procedure Laterality Date  . ABDOMINAL HYSTERECTOMY    . BREAST EXCISIONAL BIOPSY Bilateral    benign  . CESAREAN SECTION     x2  . PARTIAL HYSTERECTOMY     TAH    ROS: Review of Systems Eye: Complains of having small nodule in the left upper eyelid for years.  It has not increased in size and does not affect her vision.  However it itches a lot..  PHYSICAL EXAM: BP 135/76   Pulse (!) 58   Temp 98.3 F (36.8 C) (Oral)   Resp 16   Wt 168 lb (76.2 kg)   SpO2 95%   BMI 27.96 kg/m   Repeat BP 132/80 Physical Exam  General appearance - alert, well appearing, and in no distress Mental status - normal mood, behavior, speech, dress, motor activity, and thought processes Eyes -tiny nodule that looks like a skin tag on the left upper eyelid medially  ears -skin of the ear canal looks dry bilaterally.  She has dried wax buildup in the right ear. Neck - supple, no significant adenopathy Chest - clear to auscultation, no wheezes, rales or rhonchi, symmetric air entry Heart - normal rate, regular rhythm, normal S1, S2, no murmurs, rubs, clicks or gallops Extremities - peripheral pulses normal, no pedal edema, no clubbing or cyanosis  Results for orders placed or performed in visit on 11/11/17  Comprehensive metabolic panel  Result Value Ref Range   Glucose 97 65 - 99 mg/dL   BUN 13 8 - 27 mg/dL   Creatinine, Ser 0.69 0.57 - 1.00 mg/dL   GFR calc non Af Amer 95 >59 mL/min/1.73   GFR calc Af Amer 109 >59 mL/min/1.73   BUN/Creatinine Ratio 19 12 - 28   Sodium 142 134 - 144 mmol/L   Potassium 3.5 3.5 - 5.2 mmol/L   Chloride 99 96 - 106 mmol/L   CO2 27 20 - 29 mmol/L   Calcium 9.7 8.7 - 10.3 mg/dL   Total Protein 7.2 6.0 - 8.5 g/dL   Albumin 4.4 3.6 - 4.8 g/dL   Globulin, Total 2.8 1.5 - 4.5 g/dL   Albumin/Globulin Ratio 1.6 1.2 - 2.2   Bilirubin Total 0.3 0.0 - 1.2 mg/dL   Alkaline Phosphatase 96 39 - 117 IU/L   AST 18 0 - 40 IU/L   ALT 24 0 - 32 IU/L  CBC  Result Value Ref  Range   WBC 9.3 3.4 - 10.8 x10E3/uL   RBC 4.87 3.77 -  5.28 x10E6/uL   Hemoglobin 14.3 11.1 - 15.9 g/dL   Hematocrit 43.2 34.0 - 46.6 %   MCV 89 79 - 97 fL   MCH 29.4 26.6 - 33.0 pg   MCHC 33.1 31.5 - 35.7 g/dL   RDW 13.5 12.3 - 15.4 %   Platelets 229 150 - 379 x10E3/uL  Lipid panel  Result Value Ref Range   Cholesterol, Total 197 100 - 199 mg/dL   Triglycerides 130 0 - 149 mg/dL   HDL 46 >39 mg/dL   VLDL Cholesterol Cal 26 5 - 40 mg/dL   LDL Calculated 125 (H) 0 - 99 mg/dL   Chol/HDL Ratio 4.3 0.0 - 4.4 ratio    ASSESSMENT AND PLAN: 1. Essential hypertension Close to goal.  Continue current medications - hydrochlorothiazide (HYDRODIURIL) 25 MG tablet; Take 1 tablet (25 mg total) by mouth daily.  Dispense: 90 tablet; Refill: 2  2. Itching of ear We will try her with triamcinolone cream.  Patient advised to put a small amount on a Q-tip and use in the ear 2-3 times a week as needed. - triamcinolone cream (KENALOG) 0.1 %; Apply small amount to Q-tip and use on affected area 2-3 times a week PRN  Dispense: 15 g; Refill: 0 - loratadine (CLARITIN) 10 MG tablet; Take 1 tablet (10 mg total) by mouth daily.  Dispense: 30 tablet; Refill: 2  3. Impacted cerumen of right ear Advised to use Debrox or Cerumenex over-the-counter.  She was given a sample of Debrox  4. Skin tag - Ambulatory referral to Dermatology  5. Influenza vaccination declined  Patient was given the opportunity to ask questions.  Patient verbalized understanding of the plan and was able to repeat key elements of the plan.   Orders Placed This Encounter  Procedures  . Ambulatory referral to Dermatology     Requested Prescriptions   Signed Prescriptions Disp Refills  . hydrochlorothiazide (HYDRODIURIL) 25 MG tablet 90 tablet 2    Sig: Take 1 tablet (25 mg total) by mouth daily.  Marland Kitchen triamcinolone cream (KENALOG) 0.1 % 15 g 0    Sig: Apply small amount to Q-tip and use on affected area 2-3 times a week PRN  .  loratadine (CLARITIN) 10 MG tablet 30 tablet 2    Sig: Take 1 tablet (10 mg total) by mouth daily.    No follow-ups on file.  Karle Plumber, MD, FACP

## 2018-03-10 NOTE — Patient Instructions (Signed)
Purchase some Debrox or Cerumenex over-the-counter and use in the right ear to soften up and remove wax.

## 2018-04-20 ENCOUNTER — Telehealth: Payer: Self-pay | Admitting: Internal Medicine

## 2018-04-20 NOTE — Telephone Encounter (Signed)
Open in error

## 2018-05-19 ENCOUNTER — Ambulatory Visit: Payer: Self-pay | Attending: Internal Medicine

## 2018-05-19 MED FILL — HYDROCHLOROTHIAZIDE 25 MG T: 25 | 30 days supply | Qty: 30 | Fill #0

## 2018-06-06 ENCOUNTER — Other Ambulatory Visit: Payer: Self-pay

## 2018-06-08 LAB — SPECIMEN STATUS REPORT

## 2018-06-08 LAB — FECAL OCCULT BLOOD, IMMUNOCHEMICAL: Fecal Occult Bld: NEGATIVE

## 2018-06-09 ENCOUNTER — Ambulatory Visit: Payer: Self-pay | Attending: Internal Medicine | Admitting: Internal Medicine

## 2018-06-09 ENCOUNTER — Telehealth: Payer: Self-pay | Admitting: Internal Medicine

## 2018-06-09 ENCOUNTER — Encounter: Payer: Self-pay | Admitting: Internal Medicine

## 2018-06-09 VITALS — BP 128/83 | HR 53 | Temp 97.6°F | Resp 16 | Wt 169.2 lb

## 2018-06-09 DIAGNOSIS — Z79899 Other long term (current) drug therapy: Secondary | ICD-10-CM | POA: Insufficient documentation

## 2018-06-09 DIAGNOSIS — L299 Pruritus, unspecified: Secondary | ICD-10-CM

## 2018-06-09 DIAGNOSIS — K219 Gastro-esophageal reflux disease without esophagitis: Secondary | ICD-10-CM | POA: Insufficient documentation

## 2018-06-09 DIAGNOSIS — Z760 Encounter for issue of repeat prescription: Secondary | ICD-10-CM | POA: Insufficient documentation

## 2018-06-09 DIAGNOSIS — Z90710 Acquired absence of both cervix and uterus: Secondary | ICD-10-CM | POA: Insufficient documentation

## 2018-06-09 DIAGNOSIS — Z87891 Personal history of nicotine dependence: Secondary | ICD-10-CM | POA: Insufficient documentation

## 2018-06-09 DIAGNOSIS — I10 Essential (primary) hypertension: Secondary | ICD-10-CM | POA: Insufficient documentation

## 2018-06-09 DIAGNOSIS — F419 Anxiety disorder, unspecified: Secondary | ICD-10-CM | POA: Insufficient documentation

## 2018-06-09 DIAGNOSIS — Z9889 Other specified postprocedural states: Secondary | ICD-10-CM | POA: Insufficient documentation

## 2018-06-09 DIAGNOSIS — K589 Irritable bowel syndrome without diarrhea: Secondary | ICD-10-CM | POA: Insufficient documentation

## 2018-06-09 DIAGNOSIS — H6123 Impacted cerumen, bilateral: Secondary | ICD-10-CM | POA: Insufficient documentation

## 2018-06-09 MED ORDER — LORATADINE 10 MG PO TABS: 10.0000 mg | ORAL_TABLET | Freq: Every day | ORAL | 2 refills | Status: DC

## 2018-06-09 MED ORDER — TRIAMCINOLONE ACETONIDE 0.1 % EX CREA: TOPICAL_CREAM | CUTANEOUS | 0 refills | Status: DC

## 2018-06-09 MED FILL — TRIAMCINOLONE ACETONIDE 0.1: 0.1 | 10 days supply | Qty: 15 | Fill #0

## 2018-06-09 MED FILL — ?LORATADINE 10 MG TABS: 10 | 30 days supply | Qty: 30 | Fill #1

## 2018-06-09 MED FILL — TRIAMCINOLONE ACETONIDE 0.1: 0.1 | 7 days supply | Qty: 15 | Fill #0

## 2018-06-09 NOTE — Progress Notes (Signed)
Patient ID: Shannon [name], female    DOB: 1955-08-26  MRN: [id number]  CC: Medication Refill   Subjective: Shannon [name] is a 62 y.o. female who presents for chronic ds management. Her concerns today include:  Pt with hx of HTN, IBS (on Celexa for this with overlay of anxiety), GERD, seasonal allergies  Ears still itches a lot but Triamcinolone helps to calm it down.   Wants to know if there is anything stronger Took Claritin for a short period but made her dizzy; took it during the day  HTN:  Compliant with HCTZ and salt restriction.  Wants 90 day supply on HCTZ No CP/SOB/LE edema Patient Active Problem List   Diagnosis Date Noted  . Seasonal allergies 11/11/2017  . Plantar fascial fibromatosis of both feet 05/20/2017  . Irritable bowel syndrome without diarrhea 03/11/2017  . Gastroesophageal reflux disease without esophagitis 03/11/2017  . Postmenopausal postcoital bleeding 08/27/2016  . HTN (hypertension), benign 04/02/2016  . Vitamin D deficiency 10/10/2015  . Chronic knee pain 10/11/2014  . Eczema 10/11/2014  . History of right knee surgery 10/11/2014     Current Outpatient Medications on File Prior to Visit  Medication Sig Dispense Refill  . Ascorbic Acid (VITAMIN C) 1000 MG tablet Take 1,000 mg by mouth once a week.    . Cholecalciferol (VITAMIN D) 2000 units tablet Take 2,000 Units by mouth once a week.    . COD LIVER OIL PO Take 1 capsule by mouth once a week.    . Coenzyme Q10 (CO Q 10) 100 MG CAPS Take 1 capsule by mouth daily.    . fluticasone (FLONASE) 50 MCG/ACT nasal spray Place 2 sprays into both nostrils daily. 16 g 6  . Homeopathic Products (CACTUS COMPOSITUM PO) Take 1 capsule by mouth once a week. Reported on 10/10/2015    . hydrochlorothiazide (HYDRODIURIL) 25 MG tablet Take 1 tablet (25 mg total) by mouth daily. 90 tablet 2  . omeprazole (PRILOSEC) 40 MG capsule Take 1 capsule (40 mg total) by mouth daily. 30 capsule 3   No current  facility-administered medications on file prior to visit.     No Known Allergies  Social History   Socioeconomic History  . Marital status: Single    Spouse name: Not on file  . Number of children: Not on file  . Years of education: Not on file  . Highest education level: Not on file  Occupational History  . Not on file  Social Needs  . Financial resource strain: Not on file  . Food insecurity:    Worry: Not on file    Inability: Not on file  . Transportation needs:    Medical: Not on file    Non-medical: Not on file  Tobacco Use  . Smoking status: Former Smoker    Packs/day: 0.25    Years: 20.00    Pack years: 5.00    Types: Cigarettes    Last attempt to quit: 08/01/2015    Years since quitting: 2.8  . Smokeless tobacco: Never Used  Substance and Sexual Activity  . Alcohol use: Yes    Alcohol/week: 6.0 standard drinks    Types: 6 Shots of liquor per week    Comment: 2-3 drinks per night of weekends per pt.  . Drug use: No  . Sexual activity: Not Currently    Birth control/protection: Surgical  Lifestyle  . Physical activity:    Days per week: 0 days    Minutes per session: 0  min  . Stress: Only a little  Relationships  . Social connections:    Talks on phone: More than three times a week    Gets together: Once a week    Attends religious service: Never    Active member of club or organization: No    Attends meetings of clubs or organizations: Never    Relationship status: Never married  . Intimate partner violence:    Fear of current or ex partner: Patient refused    Emotionally abused: Patient refused    Physically abused: Patient refused    Forced sexual activity: Patient refused  Other Topics Concern  . Not on file  Social History Narrative  . Not on file    Family History  Problem Relation Age of Onset  . Colon cancer Father 62  . Cancer Father   . Cancer Maternal Grandmother     Past Surgical History:  Procedure Laterality Date  . ABDOMINAL  HYSTERECTOMY    . BREAST EXCISIONAL BIOPSY Bilateral    benign  . CESAREAN SECTION     x2  . PARTIAL HYSTERECTOMY     TAH    ROS: Review of Systems Neg except as above  PHYSICAL EXAM: BP 128/83   Pulse (!) 53   Temp 97.6 F (36.4 C) (Oral)   Resp 16   Wt 169 lb 3.2 oz (76.7 kg)   SpO2 95%   BMI 28.16 kg/m   Physical Exam  General appearance - alert, well appearing, and in no distress Mental status - normal mood, behavior, speech, dress, motor activity, and thought processes Ears -moderate amount of hard wax buildup in the right ear.  Small amount buildup in the left ear canal.  Mild atopic appearing skin around the opening to the ear canal on both sides Chest - clear to auscultation, no wheezes, rales or rhonchi, symmetric air entry Heart - normal rate, regular rhythm, normal S1, S2, no murmurs, rubs, clicks or gallops   ASSESSMENT AND PLAN:  1. Itching of ear Refill Claritin.  Advised her to take it at bedtime.  Refill triamcinolone cream.  If both of them together does not resolve the issue, we can refer to ENT - loratadine (CLARITIN) 10 MG tablet; Take 1 tablet (10 mg total) by mouth daily.  Dispense: 30 tablet; Refill: 2 - triamcinolone cream (KENALOG) 0.1 %; Apply small amount to Q-tip and use on affected area 2-3 times a week PRN  Dispense: 15 g; Refill: 0  2. Bilateral impacted cerumen My CMA irrigated both of her ears today  3. Essential hypertension Close to goal.  Continue current medication and DASH diet   Patient was given the opportunity to ask questions.  Patient verbalized understanding of the plan and was able to repeat key elements of the plan.   No orders of the defined types were placed in this encounter.    Requested Prescriptions   Signed Prescriptions Disp Refills  . loratadine (CLARITIN) 10 MG tablet 30 tablet 2    Sig: Take 1 tablet (10 mg total) by mouth daily.  Marland Kitchen triamcinolone cream (KENALOG) 0.1 % 15 g 0    Sig: Apply small amount  to Q-tip and use on affected area 2-3 times a week PRN    Return in about 4 months (around 10/08/2018).  Karle Plumber, MD, FACP

## 2018-06-09 NOTE — Telephone Encounter (Signed)
Patient came in wanting to get a copy of the [place] letter. Please follow up.

## 2018-06-09 NOTE — Telephone Encounter (Signed)
Spoke with Pt, she will pick the latter at the [facility] today

## 2018-06-13 ENCOUNTER — Encounter (HOSPITAL_COMMUNITY): Payer: Self-pay

## 2018-06-13 MED FILL — HYDROCHLOROTHIAZIDE 25 MG T: 25 | 90 days supply | Qty: 90 | Fill #0

## 2018-06-13 MED FILL — FLUTICASONE PROP 50 MCG SPR: 50 | 30 days supply | Qty: 16 | Fill #1

## 2018-07-07 ENCOUNTER — Ambulatory Visit: Payer: Self-pay | Admitting: Internal Medicine

## 2018-08-06 ENCOUNTER — Other Ambulatory Visit (HOSPITAL_COMMUNITY): Payer: Self-pay | Admitting: *Deleted

## 2018-08-06 DIAGNOSIS — Z1231 Encounter for screening mammogram for malignant neoplasm of breast: Secondary | ICD-10-CM

## 2018-09-30 MED FILL — HYDROCHLOROTHIAZIDE 25 MG T: 25 | 90 days supply | Qty: 90 | Fill #1

## 2018-10-06 ENCOUNTER — Encounter (INDEPENDENT_AMBULATORY_CARE_PROVIDER_SITE_OTHER): Payer: Self-pay | Admitting: Primary Care

## 2018-10-06 ENCOUNTER — Other Ambulatory Visit: Payer: Self-pay

## 2018-10-06 ENCOUNTER — Ambulatory Visit (INDEPENDENT_AMBULATORY_CARE_PROVIDER_SITE_OTHER): Payer: Self-pay | Admitting: Primary Care

## 2018-10-06 VITALS — BP 133/71 | HR 59 | Temp 97.7°F | Ht 65.0 in | Wt 168.2 lb

## 2018-10-06 DIAGNOSIS — L299 Pruritus, unspecified: Secondary | ICD-10-CM

## 2018-10-06 DIAGNOSIS — I10 Essential (primary) hypertension: Secondary | ICD-10-CM

## 2018-10-06 DIAGNOSIS — M62838 Other muscle spasm: Secondary | ICD-10-CM

## 2018-10-06 DIAGNOSIS — L309 Dermatitis, unspecified: Secondary | ICD-10-CM

## 2018-10-06 MED ORDER — TRIAMCINOLONE ACETONIDE 0.1 % EX CREA: TOPICAL_CREAM | CUTANEOUS | 0 refills | Status: DC

## 2018-10-06 MED ORDER — CYCLOBENZAPRINE HCL 10 MG PO TABS: 10.0000 mg | ORAL_TABLET | Freq: Three times a day (TID) | ORAL | 0 refills | Status: DC | PRN

## 2018-10-06 NOTE — Progress Notes (Signed)
Acute Office Visit  Subjective:    Patient ID: LENI PANKONIN, female    DOB: 1955-12-17, 63 y.o.   MRN: [id number]  Chief Complaint  Patient presents with  . Follow-up    HTN    HPI Patient is in today for itching in her ears and pain in her lower back and hand. 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 Patient is also experiencing insomnia and muscle spasm. Will refer insomnia to PCP.  Past Medical History:  Diagnosis Date  . Hypertension     Past Surgical History:  Procedure Laterality Date  . ABDOMINAL HYSTERECTOMY    . BREAST EXCISIONAL BIOPSY Bilateral    benign  . CESAREAN SECTION     x2  . PARTIAL HYSTERECTOMY     TAH    Family History  Problem Relation Age of Onset  . Colon cancer Father 26  . Cancer Father   . Cancer Maternal Grandmother     Social History   Socioeconomic History  . Marital status: Single    Spouse name: Not on file  . Number of children: Not on file  . Years of education: Not on file  . Highest education level: Not on file  Occupational History  . Not on file  Social Needs  . Financial resource strain: Not on file  . Food insecurity:    Worry: Not on file    Inability: Not on file  . Transportation needs:    Medical: Not on file    Non-medical: Not on file  Tobacco Use  . Smoking status: Former Smoker    Packs/day: 0.25    Years: 20.00    Pack years: 5.00    Types: Cigarettes    Last attempt to quit: 08/01/2015    Years since quitting: 3.1  . Smokeless tobacco: Never Used  Substance and Sexual Activity  . Alcohol use: Yes    Alcohol/week: 6.0 standard drinks    Types: 6 Shots of liquor per week    Comment: 2-3 drinks per night of weekends per pt.  . Drug use: No  . Sexual activity: Not Currently    Birth control/protection: Surgical  Lifestyle  . Physical activity:    Days per week: 0 days    Minutes per session: 0 min  . Stress: Only a little   Relationships  . Social connections:    Talks on phone: More than three times a week    Gets together: Once a week    Attends religious service: Never    Active member of club or organization: No    Attends meetings of clubs or organizations: Never    Relationship status: Never married  . Intimate partner violence:    Fear of current or ex partner: Patient refused    Emotionally abused: Patient refused    Physically abused: Patient refused    Forced sexual activity: Patient refused  Other Topics Concern  . Not on file  Social History Narrative  . Not on file    Outpatient Medications Prior to Visit  Medication Sig Dispense Refill  . Ascorbic Acid (VITAMIN C) 1000 MG tablet Take 1,000 mg by mouth once a week.    . Cholecalciferol (VITAMIN D) 2000 units tablet Take 2,000 Units by mouth once a week.    . COD LIVER OIL PO Take 1 capsule by mouth once a week.    . Coenzyme Q10 (CO Q 10) 100  MG CAPS Take 1 capsule by mouth daily.    . fluticasone (FLONASE) 50 MCG/ACT nasal spray Place 2 sprays into both nostrils daily. 16 g 6  . Homeopathic Products (CACTUS COMPOSITUM PO) Take 1 capsule by mouth once a week. Reported on 10/10/2015    . hydrochlorothiazide (HYDRODIURIL) 25 MG tablet Take 1 tablet (25 mg total) by mouth daily. 90 tablet 2  . triamcinolone cream (KENALOG) 0.1 % Apply small amount to Q-tip and use on affected area 2-3 times a week PRN 15 g 0  . loratadine (CLARITIN) 10 MG tablet Take 1 tablet (10 mg total) by mouth daily. 30 tablet 2  . omeprazole (PRILOSEC) 40 MG capsule Take 1 capsule (40 mg total) by mouth daily. 30 capsule 3   No facility-administered medications prior to visit.     No Known Allergies  Review of Systems  Constitutional: Negative.   HENT:       Ear itching   Eyes: Negative.   Respiratory: Negative.   Cardiovascular: Negative.   Gastrointestinal: Negative.   Genitourinary: Negative.   Musculoskeletal: Positive for back pain and joint pain.   Skin: Negative.   Neurological: Negative.   Endo/Heme/Allergies: Negative.        Objective:    Physical Exam  Constitutional: She is oriented to person, place, and time. She appears well-developed.  HENT:  Head: Normocephalic.  Eyes: Pupils are equal, round, and reactive to light.  Neck: Normal range of motion.  Cardiovascular: Normal rate and regular rhythm.  Pulmonary/Chest: Effort normal and breath sounds normal.  Abdominal: Soft.  Musculoskeletal:     Comments: Back pain  Neurological: She is alert and oriented to person, place, and time.  Skin: Skin is warm and dry.  Psychiatric: She has a normal mood and affect.    BP 133/71 (BP Location: Right Arm, Patient Position: Sitting, Cuff Size: Normal)   Pulse (!) 59   Temp 97.7 F (36.5 C) (Oral)   Ht 5\' 5"  (1.651 m)   Wt 168 lb 3.2 oz (76.3 kg)   SpO2 95%   BMI 27.99 kg/m  Wt Readings from Last 3 Encounters:  10/06/18 168 lb 3.2 oz (76.3 kg)  06/09/18 169 lb 3.2 oz (76.7 kg)  03/10/18 168 lb (76.2 kg)    There are no preventive care reminders to display for this patient.  There are no preventive care reminders to display for this patient.   Lab Results  Component Value Date   TSH 0.66 10/12/2015   Lab Results  Component Value Date   WBC 9.3 11/11/2017   HGB 14.3 11/11/2017   HCT 43.2 11/11/2017   MCV 89 11/11/2017   PLT 229 11/11/2017   Lab Results  Component Value Date   NA 142 11/11/2017   K 3.5 11/11/2017   CO2 27 11/11/2017   GLUCOSE 97 11/11/2017   BUN 13 11/11/2017   CREATININE 0.69 11/11/2017   BILITOT 0.3 11/11/2017   ALKPHOS 96 11/11/2017   AST 18 11/11/2017   ALT 24 11/11/2017   PROT 7.2 11/11/2017   ALBUMIN 4.4 11/11/2017   CALCIUM 9.7 11/11/2017   Lab Results  Component Value Date   CHOL 197 11/11/2017   Lab Results  Component Value Date   HDL 46 11/11/2017   Lab Results  Component Value Date   LDLCALC 125 (H) 11/11/2017   Lab Results  Component Value Date   TRIG 130  11/11/2017   Lab Results  Component Value Date   CHOLHDL 4.3  11/11/2017   Lab Results  Component Value Date   HGBA1C 5.2 12/11/2016      Catalena was seen today for follow-up.  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   Assessment & Plan:   Problem List Items Addressed This Visit    None       Kerin Perna, NP

## 2018-10-07 MED FILL — CYCLOBENZAPRINE 10 MG TAB: 10 | 10 days supply | Qty: 30 | Fill #0

## 2018-10-07 MED FILL — TRIAMCINOLONE ACETONIDE 0.1: 0.1 | 15 days supply | Qty: 15 | Fill #0

## 2018-10-09 ENCOUNTER — Ambulatory Visit: Payer: Self-pay | Admitting: Internal Medicine

## 2018-10-13 ENCOUNTER — Other Ambulatory Visit (HOSPITAL_COMMUNITY): Payer: Self-pay | Admitting: *Deleted

## 2018-10-13 DIAGNOSIS — Z1231 Encounter for screening mammogram for malignant neoplasm of breast: Secondary | ICD-10-CM

## 2018-10-16 ENCOUNTER — Ambulatory Visit (HOSPITAL_COMMUNITY): Payer: Self-pay

## 2018-12-01 ENCOUNTER — Telehealth: Payer: Self-pay | Admitting: Primary Care

## 2018-12-01 NOTE — Telephone Encounter (Signed)
New Message  Pt is calling, states she received her orange card but not her pharmacy card. Please advise

## 2018-12-04 NOTE — Telephone Encounter (Signed)
Pt was inform that she need to contact [facility]. To get an update about her CAFA and after she has been approve to contac me issue the [facility] pharmacy

## 2019-01-01 ENCOUNTER — Telehealth: Payer: Self-pay | Admitting: Primary Care

## 2019-01-01 MED FILL — HYDROCHLOROTHIAZIDE 25 MG T: 25 | 90 days supply | Qty: 90 | Fill #2

## 2019-01-01 NOTE — Telephone Encounter (Signed)
Pt was call ed to be remind that he need to bring the application and the paperwork that she need to apply for CAFa and OC, the dateline is today

## 2019-01-05 ENCOUNTER — Ambulatory Visit: Payer: Self-pay | Attending: Family Medicine

## 2019-01-05 ENCOUNTER — Ambulatory Visit: Payer: Self-pay

## 2019-01-05 ENCOUNTER — Other Ambulatory Visit: Payer: Self-pay

## 2019-01-26 ENCOUNTER — Ambulatory Visit (INDEPENDENT_AMBULATORY_CARE_PROVIDER_SITE_OTHER): Payer: Self-pay | Admitting: Primary Care

## 2019-01-27 ENCOUNTER — Ambulatory Visit (HOSPITAL_COMMUNITY)
Admission: RE | Admit: 2019-01-27 | Discharge: 2019-01-27 | Disposition: A | Discharge status: Home / Self Care | Payer: Self-pay | Source: Ambulatory Visit | Attending: Obstetrics and Gynecology | Admitting: Obstetrics and Gynecology

## 2019-01-27 ENCOUNTER — Encounter (HOSPITAL_COMMUNITY): Payer: Self-pay

## 2019-01-27 ENCOUNTER — Ambulatory Visit
Admission: RE | Admit: 2019-01-27 | Discharge: 2019-01-27 | Disposition: A | Discharge status: Home / Self Care | Payer: No Typology Code available for payment source | Source: Ambulatory Visit | Attending: Obstetrics and Gynecology | Admitting: Obstetrics and Gynecology

## 2019-01-27 ENCOUNTER — Other Ambulatory Visit: Payer: Self-pay

## 2019-01-27 DIAGNOSIS — Z1239 Encounter for other screening for malignant neoplasm of breast: Secondary | ICD-10-CM | POA: Insufficient documentation

## 2019-01-27 DIAGNOSIS — Z1231 Encounter for screening mammogram for malignant neoplasm of breast: Secondary | ICD-10-CM

## 2019-01-27 NOTE — Progress Notes (Addendum)
No complaints today.   Pap Smear: Pap smear not completed today. Last Pap smear was 09/10/2008 and normal. Per patient has no history of an abnormal Pap smear. Patient has a history of a hysterectomy completed 08/24/2004 due to AUB. Patient doesn't need any further Pap smears due to her history of a hysterectomy for benign reasons per BCCCP and ACOG guidelines. Last Pap smear result is in Epic.  Physical exam: Breasts Breasts symmetrical. No skin abnormalities bilateral breasts. No nipple retraction bilateral breasts. No nipple discharge bilateral breasts. No lymphadenopathy. No lumps palpated bilateral breasts. No complaints of pain or tenderness on exam. Referred patient to the [facility] for a screening mammogram. Appointment scheduled for Tuesday, January 27, 2019 at 0910.        Pelvic/Bimanual No Pap smear completed today sincepatient has a history of a hysterectomy for benign reasons.Pap smear not indicated per BCCCP guidelines.  Smoking History: Patientis a former smoker that quit 08/01/2015. Patient smoked 0.25 packs per day for 20 years.  Patient Navigation: Patient education provided. Access to services provided for patient through [facility] program.   Colorectal Cancer Screening: Patient had a colonoscopy completed2/13/2017. No complaints today.   Breast and Cervical Cancer Risk Assessment: Patient has a family history of a first cousin having breast cancer. Patient has no known genetic mutations or history of radiation treatment to the chest before age 38. Patient has no history of cervical dysplasia, immunocompromised, or DES exposure in-utero.  Risk Assessment    Risk Scores      01/27/2019   Last edited by: Armond Hang, LPN   5-year risk: 1.9 %   Lifetime risk: 9.3 %

## 2019-01-27 NOTE — Addendum Note (Signed)
Encounter addended by: Loletta Parish, RN on: 01/27/2019 9:39 AM  Actions taken: Clinical Note Signed

## 2019-01-27 NOTE — Patient Instructions (Signed)
Explained breast self awareness with Shannon [name]. Patient did not need a Pap smear today due to patient has a history of a hysterectomy for benign reasons. Let patient know that she doesn't need any further Pap smears due to her history of hysterectomy for benign reasons. Referred patient to the [facility] for a screening mammogram. Appointment scheduled for Tuesday, January 27, 2019 at 0910. Patient aware of appointment and will be there. Let [facility] know [facility] will [facility] up with her within the next couple weeks with results of mammogram by letter or phone. Shannon [name] verbalized understanding.  Shannon [name], Arvil Chaco, RN 8:21 AM

## 2019-01-28 ENCOUNTER — Encounter (HOSPITAL_COMMUNITY): Payer: Self-pay | Admitting: *Deleted

## 2019-02-23 ENCOUNTER — Ambulatory Visit: Payer: Self-pay | Attending: Internal Medicine | Admitting: Internal Medicine

## 2019-02-23 ENCOUNTER — Encounter: Payer: Self-pay | Admitting: Internal Medicine

## 2019-02-23 ENCOUNTER — Other Ambulatory Visit: Payer: Self-pay

## 2019-02-23 VITALS — BP 129/80 | HR 55 | Temp 98.5°F | Resp 16 | Wt 166.6 lb

## 2019-02-23 DIAGNOSIS — M545 Low back pain, unspecified: Secondary | ICD-10-CM

## 2019-02-23 DIAGNOSIS — E663 Overweight: Secondary | ICD-10-CM

## 2019-02-23 DIAGNOSIS — I10 Essential (primary) hypertension: Secondary | ICD-10-CM

## 2019-02-23 MED ORDER — HYDROCHLOROTHIAZIDE 25 MG PO TABS: 25.0000 mg | ORAL_TABLET | Freq: Every day | ORAL | 3 refills | Status: DC

## 2019-02-23 NOTE — Patient Instructions (Addendum)
Follow a Healthy Eating Plan - You can do it! Limit sugary drinks.  Avoid sodas, sweet tea, sport or energy drinks, or fruit drinks.  Drink water, lo-fat milk, or diet drinks. Limit snack foods.   Cut back on candy, cake, cookies, chips, ice cream.  These are a special treat, only in small amounts. Eat plenty of vegetables.  Especially dark green, red, and orange vegetables. Aim for at least 3 servings a day. More is better! Include fruit in your daily diet.  Whole fruit is much healthier than fruit juice! Limit "white" bread, "white" pasta, "white" rice.   Choose "100% whole grain" products, brown or wild rice. Avoid fatty meats. Try "Meatless Monday" and choose eggs or beans one day a week.  When eating meat, choose lean meats like chicken, [place], and fish.  Grill, broil, or bake meats instead of frying, and eat poultry without the skin. Eat less salt.  Avoid frozen pizzas, frozen dinners and salty foods.  Use seasonings other than salt in cooking.  This can help blood pressure and keep you from swelling Beer, wine and liquor have calories.  If you can safely drink alcohol, limit to 1 drink per day for women, 2 drinks for men  Try to get in some form of moderate intensity exercise like brisk walking 3 to 4 days a week for 30 minutes.

## 2019-02-23 NOTE — Progress Notes (Signed)
Pt states her pain is coming from her pinched nerve in her back

## 2019-02-23 NOTE — Progress Notes (Signed)
Patient ID: Shannon [name], female    DOB: June 09, 1956  MRN: [id number]  CC: Hypertension and Insomnia   Subjective: Shannon [name] is a 63 y.o. female who presents for chronic ds management Her concerns today include:  Pt with hx of HTN, IBS (on Celexa for this with overlay of anxiety), GERD, seasonal allergies  Complains of having mild lower back pain x1 week.  She attributes this to a pinched nerve.  She states that she had bent over about a week ago to put a towel on her head when she got out of the shower.  When she straightened out trunk quickly she felt a twinge in the back.  She has been taking some ibuprofen and alternating heat and cold compresses.  She reports that she is much better now.  HYPERTENSION Currently taking: see medication list Med Adherence: [x]  Yes    []  No Medication side effects: []  Yes    []  No Adherence with salt restriction: [x]  Yes    []  No Home Monitoring?: []  Yes    [x]  No Monitoring Frequency: []  Yes    []  No Home BP results range: []  Yes    []  No SOB? []  Yes    [x]  No Chest Pain?: []  Yes    [x]  No Leg swelling?: []  Yes    [x]  No Headaches?: []  Yes    [x]  No Dizziness? []  Yes    [x]  No Comments:   Over wgh: drink a lot of sweet tea and [facility].  Not exercise as much as she should  Patient Active Problem List   Diagnosis Date Noted  . Screening breast examination 01/27/2019  . Seasonal allergies 11/11/2017  . Plantar fascial fibromatosis of both feet 05/20/2017  . Irritable bowel syndrome without diarrhea 03/11/2017  . Gastroesophageal reflux disease without esophagitis 03/11/2017  . Postmenopausal postcoital bleeding 08/27/2016  . HTN (hypertension), benign 04/02/2016  . Vitamin D deficiency 10/10/2015  . Chronic knee pain 10/11/2014  . Eczema 10/11/2014  . History of right knee surgery 10/11/2014     Current Outpatient Medications on File Prior to Visit  Medication Sig Dispense Refill  . Ascorbic Acid (VITAMIN C) 1000 MG  tablet Take 1,000 mg by mouth once a week.    . Cholecalciferol (VITAMIN D) 2000 units tablet Take 2,000 Units by mouth once a week.    . COD LIVER OIL PO Take 1 capsule by mouth once a week.    . Coenzyme Q10 (CO Q 10) 100 MG CAPS Take 1 capsule by mouth daily.    . cyclobenzaprine (FLEXERIL) 10 MG tablet Take 1 tablet (10 mg total) by mouth 3 (three) times daily as needed for muscle spasms. 30 tablet 0  . fluticasone (FLONASE) 50 MCG/ACT nasal spray Place 2 sprays into both nostrils daily. 16 g 6  . Homeopathic Products (CACTUS COMPOSITUM PO) Take 1 capsule by mouth once a week. Reported on 10/10/2015    . triamcinolone cream (KENALOG) 0.1 % Apply small amount to Q-tip and use on affected area 2-3 times a week PRN 15 g 0   No current facility-administered medications on file prior to visit.     No Known Allergies  Social History   Socioeconomic History  . Marital status: Single    Spouse name: Not on file  . Number of children: 2  . Years of education: Not on file  . Highest education level: Some college, no degree  Occupational History  . Not on file  Social Needs  . Financial resource strain: Not on file  . Food insecurity    Worry: Not on file    Inability: Not on file  . Transportation needs    Medical: No    Non-medical: No  Tobacco Use  . Smoking status: Former Smoker    Packs/day: 0.25    Years: 20.00    Pack years: 5.00    Types: Cigarettes    Quit date: 08/01/2015    Years since quitting: 3.5  . Smokeless tobacco: Never Used  Substance and Sexual Activity  . Alcohol use: Yes    Alcohol/week: 6.0 standard drinks    Types: 6 Shots of liquor per week    Comment: 2-3 drinks per night of weekends per pt.  . Drug use: No  . Sexual activity: Not Currently    Birth control/protection: Surgical  Lifestyle  . Physical activity    Days per week: 0 days    Minutes per session: 0 min  . Stress: Only a little  Relationships  . Social connections    Talks on phone:  More than three times a week    Gets together: Once a week    Attends religious service: Never    Active member of club or organization: No    Attends meetings of clubs or organizations: Never    Relationship status: Never married  . Intimate partner violence    Fear of current or ex partner: Patient refused    Emotionally abused: Patient refused    Physically abused: Patient refused    Forced sexual activity: Patient refused  Other Topics Concern  . Not on file  Social History Narrative  . Not on file    Family History  Problem Relation Age of Onset  . Colon cancer Father 56  . Cancer Father   . Cancer Maternal Grandmother     Past Surgical History:  Procedure Laterality Date  . ABDOMINAL HYSTERECTOMY    . BREAST EXCISIONAL BIOPSY Bilateral    benign  . CESAREAN SECTION     x2  . PARTIAL HYSTERECTOMY     TAH    ROS: Review of Systems Negative except as stated above  PHYSICAL EXAM: BP 129/80   Pulse (!) 55   Temp 98.5 F (36.9 C) (Oral)   Resp 16   Wt 166 lb 9.6 oz (75.6 kg)   SpO2 95%   BMI 27.72 kg/m   Wt Readings from Last 3 Encounters:  02/23/19 166 lb 9.6 oz (75.6 kg)  01/27/19 168 lb (76.2 kg)  10/06/18 168 lb 3.2 oz (76.3 kg)   Physical Exam  General appearance - alert, well appearing, and in no distress Mental status - normal mood, behavior, speech, dress, motor activity, and thought processes Neck - supple, no significant adenopathy Chest - clear to auscultation, no wheezes, rales or rhonchi, symmetric air entry Heart - normal rate, regular rhythm, normal S1, S2, no murmurs, rubs, clicks or gallops Extremities - peripheral pulses normal, no pedal edema, no clubbing or cyanosis   CMP Latest Ref Rng & Units 11/11/2017 10/12/2015 10/11/2014  Glucose 65 - 99 mg/dL 97 87 90  BUN 8 - 27 mg/dL 13 18 11   Creatinine 0.57 - 1.00 mg/dL 0.69 0.69 0.72  Sodium 134 - 144 mmol/L 142 143 141  Potassium 3.5 - 5.2 mmol/L 3.5 4.2 3.9  Chloride 96 - 106 mmol/L  99 108 104  CO2 20 - 29 mmol/L 27 25 30   Calcium 8.7 - 10.3  mg/dL 9.7 8.8 9.6  Total Protein 6.0 - 8.5 g/dL 7.2 - 6.7  Total Bilirubin 0.0 - 1.2 mg/dL 0.3 - 0.4  Alkaline Phos 39 - 117 IU/L 96 - 96  AST 0 - 40 IU/L 18 - 18  ALT 0 - 32 IU/L 24 - 22   Lipid Panel     Component Value Date/Time   CHOL 197 11/11/2017 1457   TRIG 130 11/11/2017 1457   HDL 46 11/11/2017 1457   CHOLHDL 4.3 11/11/2017 1457   CHOLHDL 3.7 Ratio 09/10/2008 2037   VLDL 14 09/10/2008 2037   LDLCALC 125 (H) 11/11/2017 1457    CBC    Component Value Date/Time   WBC 9.3 11/11/2017 1457   WBC 4.9 10/12/2015 0931   RBC 4.87 11/11/2017 1457   RBC 4.67 10/12/2015 0931   HGB 14.3 11/11/2017 1457   HCT 43.2 11/11/2017 1457   PLT 229 11/11/2017 1457   MCV 89 11/11/2017 1457   MCH 29.4 11/11/2017 1457   MCH 28.5 10/12/2015 0931   MCHC 33.1 11/11/2017 1457   MCHC 33.8 10/12/2015 0931   RDW 13.5 11/11/2017 1457   LYMPHSABS 1.9 10/12/2015 0931   MONOABS 0.6 10/12/2015 0931   EOSABS 0.1 10/12/2015 0931   BASOSABS 0.0 10/12/2015 0931    ASSESSMENT AND PLAN: 1. Essential hypertension At goal.  Advised to continue hydrochlorothiazide and low-salt diet - hydrochlorothiazide (HYDRODIURIL) 25 MG tablet; Take 1 tablet (25 mg total) by mouth daily.  Dispense: 90 tablet; Refill: 3 - CBC - Comprehensive metabolic panel - Lipid panel  2. Over weight Dietary counseling discussed.  Advised to eliminate sugary drinks, cut back on white carbohydrates and eat more white meat than red meat. Encourage some form of moderate intensity exercise at least 3 to 4 days a week for 30 to 45 minutes  3. Acute midline low back pain without sciatica This seems to be resolving.  She will continue conservative measures.    Patient was given the opportunity to ask questions.  Patient verbalized understanding of the plan and was able to repeat key elements of the plan.   Orders Placed This Encounter  Procedures  . CBC  .  Comprehensive metabolic panel  . Lipid panel     Requested Prescriptions   Signed Prescriptions Disp Refills  . hydrochlorothiazide (HYDRODIURIL) 25 MG tablet 90 tablet 3    Sig: Take 1 tablet (25 mg total) by mouth daily.    Return in about 4 months (around 06/25/2019).  Karle Plumber, MD, FACP

## 2019-02-24 LAB — CBC
Hematocrit: 43 % (ref 34.0–46.6)
Hemoglobin: 14.4 g/dL (ref 11.1–15.9)
MCH: 29.6 pg (ref 26.6–33.0)
MCHC: 33.5 g/dL (ref 31.5–35.7)
MCV: 89 fL (ref 79–97)
Platelets: 237 10*3/uL (ref 150–450)
RBC: 4.86 x10E6/uL (ref 3.77–5.28)
RDW: 13.1 % (ref 11.7–15.4)
WBC: 7.3 10*3/uL (ref 3.4–10.8)

## 2019-02-24 LAB — COMPREHENSIVE METABOLIC PANEL
ALT: 18 IU/L (ref 0–32)
AST: 19 IU/L (ref 0–40)
Albumin/Globulin Ratio: 2 (ref 1.2–2.2)
Albumin: 4.7 g/dL (ref 3.8–4.8)
Alkaline Phosphatase: 89 IU/L (ref 39–117)
BUN/Creatinine Ratio: 15 (ref 12–28)
BUN: 11 mg/dL (ref 8–27)
Bilirubin Total: 0.4 mg/dL (ref 0.0–1.2)
CO2: 27 mmol/L (ref 20–29)
Calcium: 9.8 mg/dL (ref 8.7–10.3)
Chloride: 98 mmol/L (ref 96–106)
Creatinine, Ser: 0.71 mg/dL (ref 0.57–1.00)
GFR calc Af Amer: 106 mL/min/{1.73_m2} (ref >59)
GFR calc non Af Amer: 92 mL/min/{1.73_m2} (ref >59)
Globulin, Total: 2.3 g/dL (ref 1.5–4.5)
Glucose: 85 mg/dL (ref 65–99)
Potassium: 3.8 mmol/L (ref 3.5–5.2)
Sodium: 142 mmol/L (ref 134–144)
Total Protein: 7 g/dL (ref 6.0–8.5)

## 2019-02-24 LAB — LIPID PANEL
Chol/HDL Ratio: 4.5 ratio — ABNORMAL HIGH (ref 0.0–4.4)
Cholesterol, Total: 227 mg/dL — ABNORMAL HIGH (ref 100–199)
HDL: 50 mg/dL (ref >39)
LDL Calculated: 152 mg/dL — ABNORMAL HIGH (ref 0–99)
Triglycerides: 127 mg/dL (ref 0–149)
VLDL Cholesterol Cal: 25 mg/dL (ref 5–40)

## 2019-03-02 ENCOUNTER — Telehealth: Payer: Self-pay

## 2019-03-02 NOTE — Telephone Encounter (Signed)
Contacted pt to go over lab results pt is aware and doesn't have any questions or concerns 

## 2019-04-03 MED FILL — HYDROCHLOROTHIAZIDE 25 MG T: 25 | 90 days supply | Qty: 90 | Fill #0

## 2019-04-17 ENCOUNTER — Other Ambulatory Visit: Payer: Self-pay | Admitting: Internal Medicine

## 2019-04-17 DIAGNOSIS — J302 Other seasonal allergic rhinitis: Secondary | ICD-10-CM

## 2019-04-20 MED FILL — FLUTICASONE PROP 50 MCG SPR: 50 | 30 days supply | Qty: 16 | Fill #0

## 2019-06-25 ENCOUNTER — Ambulatory Visit: Payer: No Typology Code available for payment source | Admitting: Internal Medicine

## 2019-06-29 ENCOUNTER — Ambulatory Visit: Payer: No Typology Code available for payment source | Admitting: Internal Medicine

## 2019-06-29 ENCOUNTER — Ambulatory Visit: Payer: Self-pay | Attending: Internal Medicine | Admitting: Internal Medicine

## 2019-06-29 DIAGNOSIS — I10 Essential (primary) hypertension: Secondary | ICD-10-CM

## 2019-06-29 DIAGNOSIS — E782 Mixed hyperlipidemia: Secondary | ICD-10-CM | POA: Insufficient documentation

## 2019-06-29 DIAGNOSIS — L298 Other pruritus: Secondary | ICD-10-CM

## 2019-06-29 DIAGNOSIS — L299 Pruritus, unspecified: Secondary | ICD-10-CM

## 2019-06-29 DIAGNOSIS — N761 Subacute and chronic vaginitis: Secondary | ICD-10-CM

## 2019-06-29 MED ORDER — METRONIDAZOLE 500 MG PO TABS: 500.0000 mg | ORAL_TABLET | Freq: Two times a day (BID) | ORAL | 0 refills | Status: DC

## 2019-06-29 MED FILL — ?METRONIDAZOLE 500 MG TABS: 500 | 7 days supply | Qty: 14 | Fill #0

## 2019-06-29 MED FILL — FLUTICASONE PROP 50 MCG SPR: 50 | 30 days supply | Qty: 16 | Fill #1

## 2019-06-29 NOTE — Progress Notes (Signed)
Virtual Visit via Telephone Note Due to current restrictions/limitations of in-office visits due to the COVID-19 pandemic, this scheduled clinical appointment was converted to a telehealth visit  I connected with Shannon [name] on 06/29/19 at 9:26 a.m by telephone and verified that I am speaking with the correct person using two identifiers. I am in my office.  The patient is at home.  Only the patient and myself participated in this encounter.  I discussed the limitations, risks, security and privacy concerns of performing an evaluation and management service by telephone and the availability of in person appointments. I also discussed with the patient that there may be a patient responsible charge related to this service. The patient expressed understanding and agreed to proceed.   History of Present Illness: Pt with hx of HTN, HL, IBS (on Celexa for this with overlay of anxiety), GERD, seasonal allergies.  Patient was last seen 02/2019.  Pt c/o having vaginal odor x few mths.  No vaginal dischg or itching. No dysuria. She has not been sexually active in past yr.  Does not douch.  Reports having intermittent bacterial infection in past.    Still gets intermittent itching in ears from "time to time." Triamcinolone helps some but wonders if there is anything stronger.  HTN: compliant with HCTZ and salt restriction.  No device to check BP.  No CP/SOB/LE edema  HL:  LDL cholesterol was 152 on last visit.  Doing better with increasing activity level and eating habits.  She tells me [place]  At work she also tries to park further away from the building.   Outpatient Encounter Medications as of 06/29/2019  Medication Sig  . Ascorbic Acid (VITAMIN C) 1000 MG tablet Take 1,000 mg by mouth once a week.  . Cholecalciferol (VITAMIN D) 2000 units tablet Take 2,000 Units by mouth once a week.  . COD LIVER OIL PO Take 1 capsule by mouth once a week.  .  Coenzyme Q10 (CO Q 10) 100 MG CAPS Take 1 capsule by mouth daily.  . cyclobenzaprine (FLEXERIL) 10 MG tablet Take 1 tablet (10 mg total) by mouth 3 (three) times daily as needed for muscle spasms.  . fluticasone (FLONASE) 50 MCG/ACT nasal spray PLACE 2 SPRAYS INTO BOTH NOSTRILS EVERY DAY  . Homeopathic Products (CACTUS COMPOSITUM PO) Take 1 capsule by mouth once a week. Reported on 10/10/2015  . hydrochlorothiazide (HYDRODIURIL) 25 MG tablet Take 1 tablet (25 mg total) by mouth daily.  Marland Kitchen triamcinolone cream (KENALOG) 0.1 % Apply small amount to Q-tip and use on affected area 2-3 times a week PRN   No facility-administered encounter medications on file as of 06/29/2019.     Observations/Objective: Results for orders placed or performed in visit on 02/23/19  CBC  Result Value Ref Range   WBC 7.3 3.4 - 10.8 x10E3/uL   RBC 4.86 3.77 - 5.28 x10E6/uL   Hemoglobin 14.4 11.1 - 15.9 g/dL   Hematocrit 43.0 34.0 - 46.6 %   MCV 89 79 - 97 fL   MCH 29.6 26.6 - 33.0 pg   MCHC 33.5 31.5 - 35.7 g/dL   RDW 13.1 11.7 - 15.4 %   Platelets 237 150 - 450 x10E3/uL  Comprehensive metabolic panel  Result Value Ref Range   Glucose 85 65 - 99 mg/dL   BUN 11 8 - 27 mg/dL   Creatinine, Ser 0.71 0.57 - 1.00 mg/dL   GFR calc non Af Amer 92 >  59 mL/min/1.73   GFR calc Af Amer 106 >59 mL/min/1.73   BUN/Creatinine Ratio 15 12 - 28   Sodium 142 134 - 144 mmol/L   Potassium 3.8 3.5 - 5.2 mmol/L   Chloride 98 96 - 106 mmol/L   CO2 27 20 - 29 mmol/L   Calcium 9.8 8.7 - 10.3 mg/dL   Total Protein 7.0 6.0 - 8.5 g/dL   Albumin 4.7 3.8 - 4.8 g/dL   Globulin, Total 2.3 1.5 - 4.5 g/dL   Albumin/Globulin Ratio 2.0 1.2 - 2.2   Bilirubin Total 0.4 0.0 - 1.2 mg/dL   Alkaline Phosphatase 89 39 - 117 IU/L   AST 19 0 - 40 IU/L   ALT 18 0 - 32 IU/L  Lipid panel  Result Value Ref Range   Cholesterol, Total 227 (H) 100 - 199 mg/dL   Triglycerides 127 0 - 149 mg/dL   HDL 50 >39 mg/dL   VLDL Cholesterol Cal 25 5 - 40 mg/dL    LDL Calculated 152 (H) 0 - 99 mg/dL   Chol/HDL Ratio 4.5 (H) 0.0 - 4.4 ratio     Assessment and Plan: 1. Essential hypertension Continue hydrochlorothiazide and low-salt diet.  2. Subacute vaginitis Offered for her to come today as a lab visit to do a vaginal swab.  However patient declines.  She feels that it is bacterial vaginosis and request the antibiotics.  Prescription given for Flagyl to treat empirically. - metroNIDAZOLE (FLAGYL) 500 MG tablet; Take 1 tablet (500 mg total) by mouth 2 (two) times daily.  Dispense: 14 tablet; Refill: 0  3. Mixed hyperlipidemia Dietary counseling given.  Encouraged her to get in some form of moderate intensity exercise with aim of 150 minutes total per week.  4. Itching of ear Advised that the triamcinolone cream is a very strong steroid cream.  She can try taking loratadine as needed to see if it will help   Follow Up Instructions: 3-4 mths   I discussed the assessment and treatment plan with the patient. The patient was provided an opportunity to ask questions and all were answered. The patient agreed with the plan and demonstrated an understanding of the instructions.   The patient was advised to call back or seek an in-person evaluation if the symptoms worsen or if the condition fails to improve as anticipated.  I provided 13 minutes of non-face-to-face time during this encounter.   Karle Plumber, MD

## 2019-07-10 ENCOUNTER — Ambulatory Visit: Payer: No Typology Code available for payment source

## 2019-07-27 ENCOUNTER — Ambulatory Visit: Payer: No Typology Code available for payment source | Attending: Internal Medicine

## 2019-07-27 ENCOUNTER — Other Ambulatory Visit: Payer: Self-pay

## 2019-07-27 MED FILL — HYDROCHLOROTHIAZIDE 25 MG T: 25 | 90 days supply | Qty: 90 | Fill #1

## 2019-08-24 ENCOUNTER — Encounter: Payer: Self-pay | Admitting: Internal Medicine

## 2019-08-24 ENCOUNTER — Other Ambulatory Visit: Payer: Self-pay

## 2019-08-24 ENCOUNTER — Ambulatory Visit: Payer: Self-pay | Attending: Internal Medicine | Admitting: Internal Medicine

## 2019-08-24 VITALS — BP 130/82 | HR 58 | Temp 98.9°F | Resp 16 | Wt 171.4 lb

## 2019-08-24 DIAGNOSIS — H029 Unspecified disorder of eyelid: Secondary | ICD-10-CM

## 2019-08-24 DIAGNOSIS — L299 Pruritus, unspecified: Secondary | ICD-10-CM

## 2019-08-24 DIAGNOSIS — G8929 Other chronic pain: Secondary | ICD-10-CM

## 2019-08-24 DIAGNOSIS — M542 Cervicalgia: Secondary | ICD-10-CM

## 2019-08-24 MED ORDER — DICLOFENAC SODIUM 1 % EX GEL: 2.0000 g | Freq: Four times a day (QID) | CUTANEOUS | 3 refills | Status: DC

## 2019-08-24 MED FILL — DICLOFENAC SODIUM 1% GEL: 1 | 12 days supply | Qty: 100 | Fill #0

## 2019-08-24 NOTE — Progress Notes (Signed)
Pt states she has been having some discomfort in her neck  Pt states she thinks it's arthritis  Pt is requesting xray's on neck

## 2019-08-24 NOTE — Progress Notes (Signed)
Patient ID: Shannon [name], female    DOB: [birth year]  MRN: [id number]  CC: Stye (left eye) and Neck Pain   Subjective: Shannon [name] is a 64 y.o. female who presents for UC visit.  Her concerns today include:  Pt with hx of HTN, HL, IBS (on Celexa for this with overlay of anxiety), GERD, seasonal allergies.  C/o itching of chronic itching of LT upper lid lesion.  She thinks it is a small stye and has been there for years.  No increase in size but it just itches.  Tried OTC wart compounds to the eyelid trying to get rid of it unsuccessfully  C/o pain, popping and cracking in neck x 1 yr.  Getting worse "Feels like I need to get it pop."  Hurts all the time and causes HA at the back of the neck.  There is no radiation down the arms.  No numbness or tingling. Uses ice on it.  Still has ittching in ears.  This has been a chronic issue for her.  She uses triamcinolone cream and on a Q-tip intermittently which helps to soothe it.  Denies any decreased hearing Request RF on Triamcinolone cream. Also uses Dimetap.   Patient Active Problem List   Diagnosis Date Noted  . Mixed hyperlipidemia 06/29/2019  . Screening breast examination 01/27/2019  . Seasonal allergies 11/11/2017  . Plantar fascial fibromatosis of both feet 05/20/2017  . Irritable bowel syndrome without diarrhea 03/11/2017  . Gastroesophageal reflux disease without esophagitis 03/11/2017  . Postmenopausal postcoital bleeding 08/27/2016  . HTN (hypertension), benign 04/02/2016  . Vitamin D deficiency 10/10/2015  . Chronic knee pain 10/11/2014  . Eczema 10/11/2014  . History of right knee surgery 10/11/2014     Current Outpatient Medications on File Prior to Visit  Medication Sig Dispense Refill  . Ascorbic Acid (VITAMIN C) 1000 MG tablet Take 1,000 mg by mouth once a week.    . Cholecalciferol (VITAMIN D) 2000 units tablet Take 2,000 Units by mouth once a week.    . COD LIVER OIL PO Take 1 capsule by mouth once a  week.    . Coenzyme Q10 (CO Q 10) 100 MG CAPS Take 1 capsule by mouth daily.    . cyclobenzaprine (FLEXERIL) 10 MG tablet Take 1 tablet (10 mg total) by mouth 3 (three) times daily as needed for muscle spasms. (Patient not taking: Reported on 08/24/2019) 30 tablet 0  . fluticasone (FLONASE) 50 MCG/ACT nasal spray PLACE 2 SPRAYS INTO BOTH NOSTRILS EVERY DAY 16 g 2  . Homeopathic Products (CACTUS COMPOSITUM PO) Take 1 capsule by mouth once a week. Reported on 10/10/2015    . hydrochlorothiazide (HYDRODIURIL) 25 MG tablet Take 1 tablet (25 mg total) by mouth daily. 90 tablet 3  . triamcinolone cream (KENALOG) 0.1 % Apply small amount to Q-tip and use on affected area 2-3 times a week PRN (Patient not taking: Reported on 08/24/2019) 15 g 0   No current facility-administered medications on file prior to visit.    No Known Allergies  Social History   Socioeconomic History  . Marital status: Single    Spouse name: Not on file  . Number of children: 2  . Years of education: Not on file  . Highest education level: Some college, no degree  Occupational History  . Not on file  Tobacco Use  . Smoking status: Former Smoker    Packs/day: 0.25    Years: 20.00    Pack years: 5.00  Types: Cigarettes    Quit date: 08/01/2015    Years since quitting: 4.0  . Smokeless tobacco: Never Used  Substance and Sexual Activity  . Alcohol use: Yes    Alcohol/week: 6.0 standard drinks    Types: 6 Shots of liquor per week    Comment: 2-3 drinks per night of weekends per pt.  . Drug use: No  . Sexual activity: Not Currently    Birth control/protection: Surgical  Other Topics Concern  . Not on file  Social History Narrative  . Not on file   Social Determinants of Health   Financial Resource Strain:   . Difficulty of Paying Living Expenses: Not on file  Food Insecurity:   . Worried About Charity fundraiser in the Last Year: Not on file  . Ran Out of Food in the Last Year: Not on file  Transportation  Needs: No Transportation Needs  . Lack of Transportation (Medical): No  . Lack of Transportation (Non-Medical): No  Physical Activity:   . Days of Exercise per Week: Not on file  . Minutes of Exercise per Session: Not on file  Stress:   . Feeling of Stress : Not on file  Social Connections:   . Frequency of Communication with Friends and Family: Not on file  . Frequency of Social Gatherings with Friends and Family: Not on file  . Attends Religious Services: Not on file  . Active Member of Clubs or Organizations: Not on file  . Attends Archivist Meetings: Not on file  . Marital Status: Not on file  Intimate Partner Violence:   . Fear of Current or Ex-Partner: Not on file  . Emotionally Abused: Not on file  . Physically Abused: Not on file  . Sexually Abused: Not on file    Family History  Problem Relation Age of Onset  . Colon cancer Father 69  . Cancer Father   . Cancer Maternal Grandmother     Past Surgical History:  Procedure Laterality Date  . ABDOMINAL HYSTERECTOMY    . BREAST EXCISIONAL BIOPSY Bilateral    benign  . CESAREAN SECTION     x2  . PARTIAL HYSTERECTOMY     TAH    ROS: Review of Systems Negative except as stated above  PHYSICAL EXAM: BP 130/82   Pulse (!) 58   Temp 98.9 F (37.2 C) (Oral)   Resp 16   Wt 171 lb 6.4 oz (77.7 kg)   SpO2 98%   BMI 28.52 kg/m   Physical Exam  General appearance - alert, well appearing, and in no distress Mental status - normal mood, behavior, speech, dress, motor activity, and thought processes Eyes: Small whitish lesion noted on the medial aspect of the left upper eyelid.  Does not appear to be a stye Ears -mild drying of the skin at the entrance to both canal.  Moderate amount of wax in both ear canal Musculoskeletal -neck: No tenderness on palpation of the cervical spine.  She has slowed passive range of motion in all directions.  Power in the upper extremities 5/5 bilaterally  CMP Latest Ref Rng &  Units 02/23/2019 11/11/2017 10/12/2015  Glucose 65 - 99 mg/dL 85 97 87  BUN 8 - 27 mg/dL 11 13 18   Creatinine 0.57 - 1.00 mg/dL 0.71 0.69 0.69  Sodium 134 - 144 mmol/L 142 142 143  Potassium 3.5 - 5.2 mmol/L 3.8 3.5 4.2  Chloride 96 - 106 mmol/L 98 99 108  CO2 20 -  29 mmol/L 27 27 25   Calcium 8.7 - 10.3 mg/dL 9.8 9.7 8.8  Total Protein 6.0 - 8.5 g/dL 7.0 7.2 -  Total Bilirubin 0.0 - 1.2 mg/dL 0.4 0.3 -  Alkaline Phos 39 - 117 IU/L 89 96 -  AST 0 - 40 IU/L 19 18 -  ALT 0 - 32 IU/L 18 24 -   Lipid Panel     Component Value Date/Time   CHOL 227 (H) 02/23/2019 1517   TRIG 127 02/23/2019 1517   HDL 50 02/23/2019 1517   CHOLHDL 4.5 (H) 02/23/2019 1517   CHOLHDL 3.7 Ratio 09/10/2008 2037   VLDL 14 09/10/2008 2037   LDLCALC 152 (H) 02/23/2019 1517    CBC    Component Value Date/Time   WBC 7.3 02/23/2019 1517   WBC 4.9 10/12/2015 0931   RBC 4.86 02/23/2019 1517   RBC 4.67 10/12/2015 0931   HGB 14.4 02/23/2019 1517   HCT 43.0 02/23/2019 1517   PLT 237 02/23/2019 1517   MCV 89 02/23/2019 1517   MCH 29.6 02/23/2019 1517   MCH 28.5 10/12/2015 0931   MCHC 33.5 02/23/2019 1517   MCHC 33.8 10/12/2015 0931   RDW 13.1 02/23/2019 1517   LYMPHSABS 1.9 10/12/2015 0931   MONOABS 0.6 10/12/2015 0931   EOSABS 0.1 10/12/2015 0931   BASOSABS 0.0 10/12/2015 0931    ASSESSMENT AND PLAN: 1. Lesion of left eyelid -The lesion on the eyelid does not seem to be a chalazion.  I will have her see an ophthalmologist to evaluate it further and see whether should be removed - Ambulatory referral to Ophthalmology  2. Chronic neck pain - DG Cervical Spine Complete; Future - diclofenac Sodium (VOLTAREN) 1 % GEL; Apply 2 g topically 4 (four) times daily.  Dispense: 100 g; Refill: 3  3. Ear itching Chronic issue that is not getting better. - Ambulatory referral to ENT     Patient was given the opportunity to ask questions.  Patient verbalized understanding of the plan and was able to repeat key  elements of the plan.   Orders Placed This Encounter  Procedures  . DG Cervical Spine Complete  . Ambulatory referral to ENT  . Ambulatory referral to Ophthalmology     Requested Prescriptions   Signed Prescriptions Disp Refills  . diclofenac Sodium (VOLTAREN) 1 % GEL 100 g 3    Sig: Apply 2 g topically 4 (four) times daily.    Return in about 3 months (around 11/21/2019) for cancel appointment for March.  Karle Plumber, MD, FACP

## 2019-09-28 ENCOUNTER — Ambulatory Visit: Payer: No Typology Code available for payment source | Admitting: Internal Medicine

## 2019-10-26 MED FILL — DICLOFENAC SODIUM 1% GEL: 1 | 12 days supply | Qty: 100 | Fill #1

## 2019-10-26 MED FILL — HYDROCHLOROTHIAZIDE 25 MG T: 25 | 90 days supply | Qty: 90 | Fill #2

## 2019-11-23 ENCOUNTER — Ambulatory Visit: Payer: Self-pay | Attending: Internal Medicine | Admitting: Internal Medicine

## 2019-11-23 ENCOUNTER — Other Ambulatory Visit: Payer: Self-pay

## 2019-11-23 DIAGNOSIS — H029 Unspecified disorder of eyelid: Secondary | ICD-10-CM

## 2019-11-23 DIAGNOSIS — N898 Other specified noninflammatory disorders of vagina: Secondary | ICD-10-CM

## 2019-11-23 DIAGNOSIS — L299 Pruritus, unspecified: Secondary | ICD-10-CM

## 2019-11-23 MED ORDER — HYDROCORTISONE-ACETIC ACID 1-2 % OT SOLN: OTIC | 0 refills | Status: DC

## 2019-11-23 MED FILL — HYDROCORTISON-ACETIC ACID S: 1-2 | 14 days supply | Qty: 10 | Fill #0

## 2019-11-23 NOTE — Progress Notes (Signed)
Pt states she has some vaginal odor and itching

## 2019-11-23 NOTE — Progress Notes (Signed)
Virtual Visit via Telephone Note Due to current restrictions/limitations of in-office visits due to the COVID-19 pandemic, this scheduled clinical appointment was converted to a telehealth visit  I connected with Shannon [name] on 11/23/19 at 2:21 p.m by telephone and verified that I am speaking with the correct person using two identifiers. I am in my office.  The patient is at home.  Only the patient and myself participated in this encounter.  I discussed the limitations, risks, security and privacy concerns of performing an evaluation and management service by telephone and the availability of in person appointments. I also discussed with the patient that there may be a patient responsible charge related to this service. The patient expressed understanding and agreed to proceed.   History of Present Illness: Pt with hx of HTN,HL,IBS (on Celexa for this with overlay of anxiety), GERD, seasonal allergies.   Pt c/o vaginal odor and itching x 1 yr.  No dischg. Treated as BV presumptively 06/2019 with Flagyl.  Also reports being given Diflucan in past. Tried home douch with vinegar.  No dysuria or hematuria Last sexually active over 1 yr ago.  Came this a.m and gave a urine and vaginal self swab.   Saw ENT at [facility] for the itching in the ears.  Cost was over $400 Given ear drop which help.  Hydrocortisone Acetic Acid [place] Otic 1%+2% Solution.  She would like to know if we have this at our pharmacy.  She would like to get a refill on it to keep on hand to use as needed.  She also states that she was sent to [facility] for the stye on the left eye.  However [facility] would not do anything. On the emergency.  She wonders whether the orange card/cone discount would cover for this. Outpatient Encounter Medications as of 11/23/2019  Medication Sig  . Ascorbic Acid (VITAMIN C) 1000 MG tablet Take 1,000 mg by mouth once a week.  . Cholecalciferol (VITAMIN D) 2000 units tablet Take 2,000 Units by mouth  once a week.  . COD LIVER OIL PO Take 1 capsule by mouth once a week.  . Coenzyme Q10 (CO Q 10) 100 MG CAPS Take 1 capsule by mouth daily.  . cyclobenzaprine (FLEXERIL) 10 MG tablet Take 1 tablet (10 mg total) by mouth 3 (three) times daily as needed for muscle spasms. (Patient not taking: Reported on 08/24/2019)  . diclofenac Sodium (VOLTAREN) 1 % GEL Apply 2 g topically 4 (four) times daily.  . fluticasone (FLONASE) 50 MCG/ACT nasal spray PLACE 2 SPRAYS INTO BOTH NOSTRILS EVERY DAY  . Homeopathic Products (CACTUS COMPOSITUM PO) Take 1 capsule by mouth once a week. Reported on 10/10/2015  . hydrochlorothiazide (HYDRODIURIL) 25 MG tablet Take 1 tablet (25 mg total) by mouth daily.  Marland Kitchen triamcinolone cream (KENALOG) 0.1 % Apply small amount to Q-tip and use on affected area 2-3 times a week PRN (Patient not taking: Reported on 08/24/2019)   No facility-administered encounter medications on file as of 11/23/2019.    Observations/Objective:   Assessment and Plan: 1. Vaginal odor Advised not to douche. - Cervicovaginal ancillary only  2. Ear itching I told patient that this is a compounded eardrop and we may not have it at our pharmacy.  If we do not have that she may have to get it at an outside pharmacy. - acetic acid-hydrocortisone (VOSOL-HC) OTIC solution; 4 drops both ears every night for 2 wks PRN  Dispense: 10 mL; Refill: 0  3. Lesion of  left eyelid Advised that the orange card/cone discount does not cover for ophthalmology.   Follow Up Instructions: 2 mths   I discussed the assessment and treatment plan with the patient. The patient was provided an opportunity to ask questions and all were answered. The patient agreed with the plan and demonstrated an understanding of the instructions.   The patient was advised to call back or seek an in-person evaluation if the symptoms worsen or if the condition fails to improve as anticipated.  I provided 17 minutes of non-face-to-face time during  this encounter.   Karle Plumber, MD

## 2019-11-24 ENCOUNTER — Other Ambulatory Visit: Payer: Self-pay | Admitting: Internal Medicine

## 2019-11-24 LAB — CERVICOVAGINAL ANCILLARY ONLY
Bacterial Vaginitis (gardnerella): POSITIVE — AB
Candida Glabrata: NEGATIVE
Candida Vaginitis: NEGATIVE
Chlamydia: NEGATIVE
Comment: NEGATIVE
Comment: NEGATIVE
Comment: NEGATIVE
Comment: NEGATIVE
Comment: NEGATIVE
Comment: NORMAL
Neisseria Gonorrhea: NEGATIVE
Trichomonas: NEGATIVE

## 2019-11-24 MED ORDER — METRONIDAZOLE 500 MG PO TABS: 500.0000 mg | ORAL_TABLET | Freq: Two times a day (BID) | ORAL | 0 refills | Status: DC

## 2019-11-24 MED FILL — metroNIDAZOLE 500 MG TABS: 500 | 7 days supply | Qty: 14 | Fill #0

## 2019-12-11 ENCOUNTER — Telehealth: Payer: Self-pay

## 2019-12-11 NOTE — Telephone Encounter (Signed)
Will forward to pcp

## 2019-12-11 NOTE — Telephone Encounter (Signed)
Patient called stating that she did a self swap on May 3rd and her test results came back positive for BV. Patient states PCP order antibiotics but seems to not have worked. Patient would like to know if PCP can prescribe something else to help with BV or wants to know if she needs to schedule an appointment. Patient does not want any more pills, would like to know if PCP can prescribe a cream. Patient is not sure if it could be a different infecton.  Please advice [phone]  Patient uses [facility] pharmacy

## 2019-12-14 MED ORDER — CLINDAMYCIN HCL 300 MG PO CAPS: 300.0000 mg | ORAL_CAPSULE | Freq: Three times a day (TID) | ORAL | 0 refills | Status: DC

## 2019-12-14 MED FILL — ?CLINDAMYCIN HCL 300MG CAPS: 300 | 7 days supply | Qty: 21 | Fill #0

## 2019-12-14 NOTE — Telephone Encounter (Signed)
Pt states she has finished the antibiotic and it hasn't cleared up. Pt states the pills made her nausea.  Pt would like rx sent to [facility]

## 2020-01-26 ENCOUNTER — Ambulatory Visit: Payer: No Typology Code available for payment source | Admitting: Internal Medicine

## 2020-01-28 ENCOUNTER — Other Ambulatory Visit (INDEPENDENT_AMBULATORY_CARE_PROVIDER_SITE_OTHER): Payer: Self-pay | Admitting: Primary Care

## 2020-01-28 DIAGNOSIS — L299 Pruritus, unspecified: Secondary | ICD-10-CM

## 2020-01-28 MED FILL — HYDROCHLOROTHIAZIDE 25 MG T: 25 | 90 days supply | Qty: 90 | Fill #3

## 2020-01-28 MED FILL — DICLOFENAC SODIUM 1% GEL: 1 | 12 days supply | Qty: 100 | Fill #2

## 2020-01-29 ENCOUNTER — Other Ambulatory Visit: Payer: Self-pay

## 2020-01-29 ENCOUNTER — Ambulatory Visit: Payer: Self-pay

## 2020-02-01 ENCOUNTER — Other Ambulatory Visit: Payer: Self-pay

## 2020-02-01 ENCOUNTER — Other Ambulatory Visit: Payer: Self-pay | Admitting: Internal Medicine

## 2020-02-01 ENCOUNTER — Ambulatory Visit: Payer: Self-pay | Admitting: Internal Medicine

## 2020-02-01 ENCOUNTER — Encounter: Payer: Self-pay | Admitting: Internal Medicine

## 2020-02-01 VITALS — BP 134/78 | HR 64 | Temp 99.1°F | Resp 16 | Wt 168.4 lb

## 2020-02-01 DIAGNOSIS — L299 Pruritus, unspecified: Secondary | ICD-10-CM

## 2020-02-01 DIAGNOSIS — I10 Essential (primary) hypertension: Secondary | ICD-10-CM

## 2020-02-01 DIAGNOSIS — E663 Overweight: Secondary | ICD-10-CM

## 2020-02-01 MED ORDER — HYDROCHLOROTHIAZIDE 25 MG PO TABS: 25.0000 mg | ORAL_TABLET | Freq: Every day | ORAL | 3 refills | Status: DC

## 2020-02-01 NOTE — Progress Notes (Signed)
Patient ID: Shannon [name], female    DOB: [birth year]  MRN: [id number]  CC: Hypertension   Subjective: Shannon [name] is a 64 y.o. female who presents for chronic ds management Her concerns today include:  Pt with hx of HTN,tob dep, HL,IBS (on Celexa for this with overlay of anxiety), GERD, seasonal allergies.  HYPERTENSION Currently taking: see medication list.  She is on HCTZ Med Adherence: [x]  Yes Medication side effects: []  Yes    [x]  No Adherence with salt restriction: []  Yes    []  No Home Monitoring?: []  Yes    [x]  No but does have device Monitoring Frequency: []  Yes    []  No Home BP results range: []  Yes    []  No SOB? []  Yes    [x]  No Chest Pain?: []  Yes    [x]  No Leg swelling?: []  Yes    [x]  No Headaches?: []  Yes    [x]  No Dizziness? []  Yes    [x]  No Comments:   Overweight: Eating more fruits, less bread and pasta.  Admits that she has been drinking more cranberry juice lately.  She has a [employer] with her today.  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  She tells me that she read on the Internet that drinking apple cider vinegar and cranberry juice helps to clear it up.  She has done that and reports that it worked for her.    Still gets itching in the ears at times.  Using Aquaphor which is 1% Hydrocortisone cream over-the-counter.  She still has some of the eardrops that is a combination of hydrocortisone 1% and acetic acid 2% to use when needed. Patient Active Problem List   Diagnosis Date Noted  . Mixed hyperlipidemia 06/29/2019  . Screening breast examination 01/27/2019  . Seasonal allergies 11/11/2017  . Plantar fascial fibromatosis of both feet 05/20/2017  . Irritable bowel syndrome without diarrhea 03/11/2017  . Gastroesophageal reflux disease without esophagitis 03/11/2017  . Postmenopausal postcoital bleeding 08/27/2016  . HTN (hypertension), benign 04/02/2016  . Vitamin D deficiency 10/10/2015  .  Chronic knee pain 10/11/2014  . Eczema 10/11/2014  . History of right knee surgery 10/11/2014     Current Outpatient Medications on File Prior to Visit  Medication Sig Dispense Refill  . acetic acid-hydrocortisone (VOSOL-HC) OTIC solution 4 drops both ears every night for 2 wks PRN 10 mL 0  . Ascorbic Acid (VITAMIN C) 1000 MG tablet Take 1,000 mg by mouth once a week.    . Cholecalciferol (VITAMIN D) 2000 units tablet Take 2,000 Units by mouth once a week.    . COD LIVER OIL PO Take 1 capsule by mouth once a week.    . Coenzyme Q10 (CO Q 10) 100 MG CAPS Take 1 capsule by mouth daily.    . cyclobenzaprine (FLEXERIL) 10 MG tablet Take 1 tablet (10 mg total) by mouth 3 (three) times daily as needed for muscle spasms. (Patient not taking: Reported on 08/24/2019) 30 tablet 0  . diclofenac Sodium (VOLTAREN) 1 % GEL Apply 2 g topically 4 (four) times daily. 100 g 3  . fluticasone (FLONASE) 50 MCG/ACT nasal spray PLACE 2 SPRAYS INTO BOTH NOSTRILS EVERY DAY 16 g 2  . Homeopathic Products (CACTUS COMPOSITUM PO) Take 1 capsule by mouth once a week. Reported on 10/10/2015     No current facility-administered medications on file prior to visit.    No Known Allergies  Social History  Socioeconomic History  . Marital status: Single    Spouse name: Not on file  . Number of children: 2  . Years of education: Not on file  . Highest education level: Some college, no degree  Occupational History  . Not on file  Tobacco Use  . Smoking status: Former Smoker    Packs/day: 0.25    Years: 20.00    Pack years: 5.00    Quit date: 08/01/2015    Years since quitting: 4.5  . Smokeless tobacco: Never Used  Vaping Use  . Vaping Use: Never used  Substance and Sexual Activity  . Alcohol use: Yes    Alcohol/week: 6.0 standard drinks    Types: 6 Shots of liquor per week    Comment: 2-3 drinks per night of weekends per pt.  . Drug use: No  . Sexual activity: Not Currently    Birth control/protection:  Surgical  Other Topics Concern  . Not on file  Social History Narrative  . Not on file   Social Determinants of Health   Financial Resource Strain:   . Difficulty of Paying Living Expenses:   Food Insecurity:   . Worried About Charity fundraiser in the Last Year:   . Arboriculturist in the Last Year:   Transportation Needs:   . Film/video editor (Medical):   Marland Kitchen Lack of Transportation (Non-Medical):   Physical Activity:   . Days of Exercise per Week:   . Minutes of Exercise per Session:   Stress:   . Feeling of Stress :   Social Connections:   . Frequency of Communication with Friends and Family:   . Frequency of Social Gatherings with Friends and Family:   . Attends Religious Services:   . Active Member of Clubs or Organizations:   . Attends Archivist Meetings:   Marland Kitchen Marital Status:   Intimate Partner Violence:   . Fear of Current or Ex-Partner:   . Emotionally Abused:   Marland Kitchen Physically Abused:   . Sexually Abused:     Family History  Problem Relation Age of Onset  . Colon cancer Father 63  . Cancer Father   . Cancer Maternal Grandmother     Past Surgical History:  Procedure Laterality Date  . ABDOMINAL HYSTERECTOMY    . BREAST EXCISIONAL BIOPSY Bilateral    benign  . CESAREAN SECTION     x2  . PARTIAL HYSTERECTOMY     TAH    ROS: Review of Systems Negative except as stated above  PHYSICAL EXAM: BP 134/78   Pulse 64   Temp 99.1 F (37.3 C)   Resp 16   Wt 168 lb 6.4 oz (76.4 kg)   SpO2 96%   BMI 28.02 kg/m   Wt Readings from Last 3 Encounters:  02/01/20 168 lb 6.4 oz (76.4 kg)  08/24/19 171 lb 6.4 oz (77.7 kg)  02/23/19 166 lb 9.6 oz (75.6 kg)    Physical Exam  General appearance - alert, well appearing, and in no distress Mental status - normal mood, behavior, speech, dress, motor activity, and thought processes Chest - clear to auscultation, no wheezes, rales or rhonchi, symmetric air entry Heart - normal rate, regular rhythm,  normal S1, S2, no murmurs, rubs, clicks or gallops Extremities - peripheral pulses normal, no pedal edema, no clubbing or cyanosis   CMP Latest Ref Rng & Units 02/23/2019 11/11/2017 10/12/2015  Glucose 65 - 99 mg/dL 85 97 87  BUN 8 - 27  mg/dL 11 13 18   Creatinine 0.57 - 1.00 mg/dL 0.71 0.69 0.69  Sodium 134 - 144 mmol/L 142 142 143  Potassium 3.5 - 5.2 mmol/L 3.8 3.5 4.2  Chloride 96 - 106 mmol/L 98 99 108  CO2 20 - 29 mmol/L 27 27 25   Calcium 8.7 - 10.3 mg/dL 9.8 9.7 8.8  Total Protein 6.0 - 8.5 g/dL 7.0 7.2 -  Total Bilirubin 0.0 - 1.2 mg/dL 0.4 0.3 -  Alkaline Phos 39 - 117 IU/L 89 96 -  AST 0 - 40 IU/L 19 18 -  ALT 0 - 32 IU/L 18 24 -   Lipid Panel     Component Value Date/Time   CHOL 227 (H) 02/23/2019 1517   TRIG 127 02/23/2019 1517   HDL 50 02/23/2019 1517   CHOLHDL 4.5 (H) 02/23/2019 1517   CHOLHDL 3.7 Ratio 09/10/2008 2037   VLDL 14 09/10/2008 2037   LDLCALC 152 (H) 02/23/2019 1517    CBC    Component Value Date/Time   WBC 7.3 02/23/2019 1517   WBC 4.9 10/12/2015 0931   RBC 4.86 02/23/2019 1517   RBC 4.67 10/12/2015 0931   HGB 14.4 02/23/2019 1517   HCT 43.0 02/23/2019 1517   PLT 237 02/23/2019 1517   MCV 89 02/23/2019 1517   MCH 29.6 02/23/2019 1517   MCH 28.5 10/12/2015 0931   MCHC 33.5 02/23/2019 1517   MCHC 33.8 10/12/2015 0931   RDW 13.1 02/23/2019 1517   LYMPHSABS 1.9 10/12/2015 0931   MONOABS 0.6 10/12/2015 0931   EOSABS 0.1 10/12/2015 0931   BASOSABS 0.0 10/12/2015 0931    ASSESSMENT AND PLAN: 1. Essential hypertension At goal. - hydrochlorothiazide (HYDRODIURIL) 25 MG tablet; Take 1 tablet (25 mg total) by mouth daily.  Dispense: 90 tablet; Refill: 3 - CBC - Comprehensive metabolic panel - Lipid panel  2. Over weight Dietary counseling given.  Encourage regular exercise  3. Ear itching Continue hydrocortisone/acetic acid ear solution as needed.    Patient was given the opportunity to ask questions.  Patient verbalized understanding of  the plan and was able to repeat key elements of the plan.   Orders Placed This Encounter  Procedures  . CBC  . Comprehensive metabolic panel  . Lipid panel     Requested Prescriptions   Signed Prescriptions Disp Refills  . hydrochlorothiazide (HYDRODIURIL) 25 MG tablet 90 tablet 3    Sig: Take 1 tablet (25 mg total) by mouth daily.    Return in about 4 months (around 06/03/2020).  Karle Plumber, MD, FACP

## 2020-02-02 ENCOUNTER — Telehealth: Payer: Self-pay | Admitting: Internal Medicine

## 2020-02-02 ENCOUNTER — Telehealth: Payer: Self-pay

## 2020-02-02 LAB — COMPREHENSIVE METABOLIC PANEL
ALT: 28 IU/L (ref 0–32)
AST: 24 IU/L (ref 0–40)
Albumin/Globulin Ratio: 1.7 (ref 1.2–2.2)
Albumin: 4.5 g/dL (ref 3.8–4.8)
Alkaline Phosphatase: 80 IU/L (ref 48–121)
BUN/Creatinine Ratio: 14 (ref 12–28)
BUN: 11 mg/dL (ref 8–27)
Bilirubin Total: 0.4 mg/dL (ref 0.0–1.2)
CO2: 28 mmol/L (ref 20–29)
Calcium: 9.6 mg/dL (ref 8.7–10.3)
Chloride: 103 mmol/L (ref 96–106)
Creatinine, Ser: 0.81 mg/dL (ref 0.57–1.00)
GFR calc Af Amer: 90 mL/min/{1.73_m2} (ref >59)
GFR calc non Af Amer: 78 mL/min/{1.73_m2} (ref >59)
Globulin, Total: 2.6 g/dL (ref 1.5–4.5)
Glucose: 105 mg/dL — ABNORMAL HIGH (ref 65–99)
Potassium: 3.6 mmol/L (ref 3.5–5.2)
Sodium: 145 mmol/L — ABNORMAL HIGH (ref 134–144)
Total Protein: 7.1 g/dL (ref 6.0–8.5)

## 2020-02-02 LAB — CBC
Hematocrit: 42.5 % (ref 34.0–46.6)
Hemoglobin: 14.3 g/dL (ref 11.1–15.9)
MCH: 29.2 pg (ref 26.6–33.0)
MCHC: 33.6 g/dL (ref 31.5–35.7)
MCV: 87 fL (ref 79–97)
Platelets: 220 10*3/uL (ref 150–450)
RBC: 4.89 x10E6/uL (ref 3.77–5.28)
RDW: 12.9 % (ref 11.7–15.4)
WBC: 7.1 10*3/uL (ref 3.4–10.8)

## 2020-02-02 LAB — LIPID PANEL
Chol/HDL Ratio: 4.3 ratio (ref 0.0–4.4)
Cholesterol, Total: 221 mg/dL — ABNORMAL HIGH (ref 100–199)
HDL: 51 mg/dL (ref >39)
LDL Chol Calc (NIH): 145 mg/dL — ABNORMAL HIGH (ref 0–99)
Triglycerides: 139 mg/dL (ref 0–149)
VLDL Cholesterol Cal: 25 mg/dL (ref 5–40)

## 2020-02-02 NOTE — Telephone Encounter (Signed)
Contacted pt to go over lab results pt is aware and doesn't have any questions or concerns 

## 2020-02-02 NOTE — Progress Notes (Signed)
Let patient know that her blood count is normal meaning no anemia.  Kidney function electrolytes normal.  Total and LDL cholesterol mildly elevated.  Healthy eating habits and regular exercise will help to lower cholesterol. Rest is for my info. The 10-year ASCVD risk score Mikey Bussing DC Brooke Bonito., et al., 2013) is: 6.6%   Values used to calculate the score:     Age: 64 years     Sex: Female     Is Non-Hispanic African American: No     Diabetic: No     Tobacco smoker: No     Systolic Blood Pressure: 449 mmHg     Is BP treated: Yes     HDL Cholesterol: 51 mg/dL     Total Cholesterol: 221 mg/dL

## 2020-02-02 NOTE — Telephone Encounter (Signed)
Patient came into the office and requested for her oc & bc to not be mailed and for financial coordinator to call her when they are ready and she would come pick them up. Please follow up at your earliest convenience

## 2020-02-02 NOTE — Telephone Encounter (Signed)
Pt was inform that the card will be at the [facility]

## 2020-02-23 ENCOUNTER — Other Ambulatory Visit: Payer: Self-pay | Admitting: Internal Medicine

## 2020-02-23 DIAGNOSIS — Z1231 Encounter for screening mammogram for malignant neoplasm of breast: Secondary | ICD-10-CM

## 2020-03-14 ENCOUNTER — Ambulatory Visit
Admission: RE | Admit: 2020-03-14 | Discharge: 2020-03-14 | Disposition: A | Discharge status: Home / Self Care | Payer: Self-pay | Source: Ambulatory Visit | Attending: Internal Medicine | Admitting: Internal Medicine

## 2020-03-14 ENCOUNTER — Other Ambulatory Visit: Payer: Self-pay

## 2020-03-14 DIAGNOSIS — Z1231 Encounter for screening mammogram for malignant neoplasm of breast: Secondary | ICD-10-CM

## 2020-03-19 ENCOUNTER — Telehealth: Payer: Self-pay

## 2020-03-19 NOTE — Telephone Encounter (Signed)
Contacted pt to go over MM results pt is aware and doesn't have any questions or concerns  

## 2020-05-02 MED FILL — HYDROCHLOROTHIAZIDE 25 MG T: 25 | 90 days supply | Qty: 90 | Fill #0

## 2020-06-06 ENCOUNTER — Other Ambulatory Visit: Payer: Self-pay

## 2020-06-06 ENCOUNTER — Other Ambulatory Visit: Payer: Self-pay | Admitting: Internal Medicine

## 2020-06-06 ENCOUNTER — Ambulatory Visit: Payer: Self-pay | Attending: Internal Medicine | Admitting: Internal Medicine

## 2020-06-06 DIAGNOSIS — B9689 Other specified bacterial agents as the cause of diseases classified elsewhere: Secondary | ICD-10-CM

## 2020-06-06 DIAGNOSIS — I10 Essential (primary) hypertension: Secondary | ICD-10-CM

## 2020-06-06 DIAGNOSIS — N760 Acute vaginitis: Secondary | ICD-10-CM

## 2020-06-06 DIAGNOSIS — G8929 Other chronic pain: Secondary | ICD-10-CM

## 2020-06-06 DIAGNOSIS — M542 Cervicalgia: Secondary | ICD-10-CM

## 2020-06-06 MED ORDER — DICLOFENAC SODIUM 1 % EX GEL: 2.0000 g | Freq: Four times a day (QID) | CUTANEOUS | 3 refills | Status: DC

## 2020-06-06 MED FILL — DICLOFENAC SODIUM 1% GEL: 1 | 12 days supply | Qty: 100 | Fill #0

## 2020-06-06 NOTE — Progress Notes (Signed)
Virtual Visit via Telephone Note Due to current restrictions/limitations of in-office visits due to the COVID-19 pandemic, this scheduled clinical appointment was converted to a telehealth visit  I connected with Shannon [name] on 06/06/20 at 1:41 p.m by telephone and verified that I am speaking with the correct person using two identifiers.  Location: Patient: home Provider: office   I discussed the limitations, risks, security and privacy concerns of performing an evaluation and management service by telephone and the availability of in person appointments. I also discussed with the patient that there may be a patient responsible charge related to this service. The patient expressed understanding and agreed to proceed.   History of Present Illness: Pt with hx of HTN,former smoker, HL,IBS (on Celexa for this with overlay of anxiety), GERD, seasonal allergies.  Reports she continues to have recurrent BV.  Treated 2 x since last visit.  She feels it is not clearing up.  Requesting RF on Voltaren gel for arthritis in the neck.   HTN:  Compliant with HCTZ.  No device to check BP.  No CP/SOB/LE edema  Outpatient Encounter Medications as of 06/06/2020  Medication Sig  . acetic acid-hydrocortisone (VOSOL-HC) OTIC solution 4 drops both ears every night for 2 wks PRN  . Ascorbic Acid (VITAMIN C) 1000 MG tablet Take 1,000 mg by mouth once a week.  . Cholecalciferol (VITAMIN D) 2000 units tablet Take 2,000 Units by mouth once a week.  . COD LIVER OIL PO Take 1 capsule by mouth once a week.  . Coenzyme Q10 (CO Q 10) 100 MG CAPS Take 1 capsule by mouth daily.  . cyclobenzaprine (FLEXERIL) 10 MG tablet Take 1 tablet (10 mg total) by mouth 3 (three) times daily as needed for muscle spasms. (Patient not taking: Reported on 08/24/2019)  . diclofenac Sodium (VOLTAREN) 1 % GEL Apply 2 g topically 4 (four) times daily.  . fluticasone (FLONASE) 50 MCG/ACT nasal spray PLACE 2 SPRAYS INTO BOTH NOSTRILS  EVERY DAY  . Homeopathic Products (CACTUS COMPOSITUM PO) Take 1 capsule by mouth once a week. Reported on 10/10/2015  . hydrochlorothiazide (HYDRODIURIL) 25 MG tablet Take 1 tablet (25 mg total) by mouth daily.   No facility-administered encounter medications on file as of 06/06/2020.      Observations/Objective: Results for orders placed or performed in visit on 02/01/20  CBC  Result Value Ref Range   WBC 7.1 3.4 - 10.8 x10E3/uL   RBC 4.89 3.77 - 5.28 x10E6/uL   Hemoglobin 14.3 11.1 - 15.9 g/dL   Hematocrit 42.5 34.0 - 46.6 %   MCV 87 79 - 97 fL   MCH 29.2 26.6 - 33.0 pg   MCHC 33.6 31 - 35 g/dL   RDW 12.9 11.7 - 15.4 %   Platelets 220 150 - 450 x10E3/uL  Comprehensive metabolic panel  Result Value Ref Range   Glucose 105 (H) 65 - 99 mg/dL   BUN 11 8 - 27 mg/dL   Creatinine, Ser 0.81 0.57 - 1.00 mg/dL   GFR calc non Af Amer 78 >59 mL/min/1.73   GFR calc Af Amer 90 >59 mL/min/1.73   BUN/Creatinine Ratio 14 12 - 28   Sodium 145 (H) 134 - 144 mmol/L   Potassium 3.6 3.5 - 5.2 mmol/L   Chloride 103 96 - 106 mmol/L   CO2 28 20 - 29 mmol/L   Calcium 9.6 8.7 - 10.3 mg/dL   Total Protein 7.1 6.0 - 8.5 g/dL   Albumin 4.5 3.8 - 4.8  g/dL   Globulin, Total 2.6 1.5 - 4.5 g/dL   Albumin/Globulin Ratio 1.7 1.2 - 2.2   Bilirubin Total 0.4 0.0 - 1.2 mg/dL   Alkaline Phosphatase 80 48 - 121 IU/L   AST 24 0 - 40 IU/L   ALT 28 0 - 32 IU/L  Lipid panel  Result Value Ref Range   Cholesterol, Total 221 (H) 100 - 199 mg/dL   Triglycerides 139 0 - 149 mg/dL   HDL 51 >39 mg/dL   VLDL Cholesterol Cal 25 5 - 40 mg/dL   LDL Chol Calc (NIH) 145 (H) 0 - 99 mg/dL   Chol/HDL Ratio 4.3 0.0 - 4.4 ratio     Assessment and Plan: 1. Chronic neck pain - diclofenac Sodium (VOLTAREN) 1 % GEL; Apply 2 g topically 4 (four) times daily.  Dispense: 100 g; Refill: 3  2. Essential hypertension Continue HCTZ and low-salt diet.  3. BV (bacterial vaginosis) Patient with recurrent BV.  Will refer to  gynecology for further recommendations. - Ambulatory referral to Gynecology   Follow Up Instructions: 4 mths   I discussed the assessment and treatment plan with the patient. The patient was provided an opportunity to ask questions and all were answered. The patient agreed with the plan and demonstrated an understanding of the instructions.   The patient was advised to call back or seek an in-person evaluation if the symptoms worsen or if the condition fails to improve as anticipated.  I provided 10 minutes of non-face-to-face time during this encounter.   Karle Plumber, MD

## 2020-07-19 ENCOUNTER — Telehealth: Payer: Self-pay | Admitting: Internal Medicine

## 2020-07-19 NOTE — Telephone Encounter (Signed)
I return Pt call, she was inform that is too early to schedule a financial appt

## 2020-07-19 NOTE — Telephone Encounter (Signed)
Copied from CRM [id number]. Topic: Appointment Scheduling - Scheduling Inquiry for Clinic >> Jul 18, 2020  9:16 AM Crist Infante wrote: Reason for CRM: pt calling for appt with Mikle Bosworth.  Pt card expires 07/31/20. Pt needs a Monday appt >> Jul 19, 2020  9:21 AM Marylen Ponto wrote: Pt called once again requesting an appt with Mikle Bosworth. Pt requests call back asap. Cb# [phone]   If I'm remembering correctly, appointments cannot be scheduled until after OC and CAFA expire. Please follow up.

## 2020-07-26 NOTE — Telephone Encounter (Addendum)
Pt is returning Shannon [name]. Pt has dental appt on 08-01-2020 and her orange card expiring on 07-31-2020

## 2020-08-01 ENCOUNTER — Other Ambulatory Visit: Payer: Self-pay

## 2020-08-01 ENCOUNTER — Ambulatory Visit: Payer: Self-pay | Attending: Internal Medicine

## 2020-08-08 ENCOUNTER — Encounter: Payer: No Typology Code available for payment source | Admitting: Obstetrics and Gynecology

## 2020-08-15 ENCOUNTER — Ambulatory Visit (INDEPENDENT_AMBULATORY_CARE_PROVIDER_SITE_OTHER): Payer: Self-pay | Admitting: Obstetrics and Gynecology

## 2020-08-15 ENCOUNTER — Encounter: Payer: No Typology Code available for payment source | Admitting: Obstetrics and Gynecology

## 2020-08-15 ENCOUNTER — Other Ambulatory Visit (HOSPITAL_COMMUNITY)
Admission: RE | Admit: 2020-08-15 | Discharge: 2020-08-15 | Disposition: A | Discharge status: Home / Self Care | Payer: No Typology Code available for payment source | Source: Ambulatory Visit | Attending: Obstetrics and Gynecology | Admitting: Obstetrics and Gynecology

## 2020-08-15 ENCOUNTER — Other Ambulatory Visit: Payer: Self-pay

## 2020-08-15 ENCOUNTER — Other Ambulatory Visit: Payer: Self-pay | Admitting: Obstetrics and Gynecology

## 2020-08-15 ENCOUNTER — Encounter: Payer: Self-pay | Admitting: Obstetrics and Gynecology

## 2020-08-15 VITALS — BP 138/72 | HR 62 | Ht 65.0 in | Wt 167.4 lb

## 2020-08-15 DIAGNOSIS — N898 Other specified noninflammatory disorders of vagina: Secondary | ICD-10-CM | POA: Insufficient documentation

## 2020-08-15 DIAGNOSIS — A630 Anogenital (venereal) warts: Secondary | ICD-10-CM

## 2020-08-15 MED ORDER — CLINDAMYCIN PHOSPHATE 2 % VA CREA: 1.0000 | TOPICAL_CREAM | Freq: Every day | VAGINAL | 0 refills | Status: DC

## 2020-08-15 NOTE — Progress Notes (Signed)
Referred from [facility] for recurrent BV .States treated twice with flagyl.   Mahkai Fangman,RN

## 2020-08-15 NOTE — Patient Instructions (Signed)
Human Papillomavirus Human papillomavirus (HPV) is the most common sexually transmitted infection (STI). It easily spreads from person to person (is very contagious). There are many types of HPV. HPV often does not cause symptoms. Sometimes it may cause warts in the genitals or anus. These warts can be seen and felt. There may also be wart-like lesions in the throat. You can have HPV for a long time and not know it. You may spread HPV to others without knowing it. Certain types of HPV may cause cancer. What are the causes? HPV is caused by a virus that spreads through skin-to-skin contact or contact through sex. What increases the risk? You may be more likely to get HPV if you have or have had:  Contact with a person who has HPV.  Unprotected sex of any kind.  Several sex partners.  A sex partner who has other sex partners.  Another sexually transmitted infection (STI).  A weak disease-fighting system (immune system).  Damage to the skin. What are the signs or symptoms? Most people who have HPV do not have any symptoms. If you have symptoms, they may include:  Wart-like lesions in the throat from having oral sex.  Warts on the infected areas.  Warts in the genitals. The warts may itch, burn, bleed, or be painful during sex.   How is this treated? There is no treatment for the virus itself. However, there are treatments for the health problems and symptoms HPV can cause. Your doctor may treat HPV by:  Giving medicines that are creams, lotions, liquids, or gels. These medicines may be injected into or put onto warts in the genitals or anus.  Applying one of the following to the warts in the genitals or anus: ? Something that is very cold. ? A strong beam of light (laser treatment). ? Something very hot.  Doing surgery to remove the warts in the genitals or anus. Your doctor will monitor you closely after you are treated. HPV can come back and you may need treatment  again. Follow these instructions at home: Medicines  Take over-the-counter and prescription medicines only as told by your doctor.  Do not treat warts in the genitals with medicines that are meant for warts in the hands. General instructions  Do not touch or scratch the warts.  Do not have sex while you are being treated.  Do not douche or use tampons during treatment (for women).  Tell your sex partner about your infection. He or she may also need to be treated.  If you get pregnant, tell your doctor that you have HPV. Your doctor will monitor you during the pregnancy.  Keep all follow-up visits. This is important. How is this prevented?  Talk with your doctor about getting an HPV vaccine. This can prevent some HPV infections and related cancers. You may need 2-3 doses of the vaccine, depending on your age. ? The vaccine will not work if you already have HPV. ? The vaccine is not recommended for pregnant women.  After treatment, use condoms during sex. This helps to prevent future infections.  Have only one sex partner.  Have a sex partner who does not have other sex partners.  Get Pap tests as told by your doctor. Contact a doctor if:  The treated skin gets red, swollen, or painful.  You have a fever.  You feel ill.  You feel lumps or pimples in and around your genitals or anus.  You have bleeding from the vagina.  You  have bleeding from the area that was treated.  You have pain during sex. Summary  HPV is the most common STI. It easily spreads from person to person.  HPV often does not cause any symptoms.  HPV vaccine can prevent some HPV infections and related cancers.  There is no treatment for the virus itself. However, there are treatments for the health problems and symptoms HPV can cause. This information is not intended to replace advice given to you by your health care provider. Make sure you discuss any questions you have with your health care  provider. Document Revised: 02/23/2020 Document Reviewed: 02/23/2020 Elsevier Patient Education  2021 [employer].

## 2020-08-15 NOTE — Progress Notes (Signed)
   History:  [name] is a 65 y.o. G3P2010 who presents to clinic today for recurrent BV    Has been treated 2x recently for BV with flagyl. Referred here from PCP. Says she has had this happen often. Has never tried anything else except for PO flagyl. Has never tried intravaginal cream. Reports that she has struggled with this for years.  Also wondering if the BV has caused skin changes. She does also endorse itching.   Has had a new sexual partner in past year, last STD testing 5/21. Uses condoms.   The following portions of the patient's history were reviewed and updated as appropriate: allergies, current medications, family history, past medical history, social history, past surgical history and problem list.  Review of Systems:  Review of Systems  Constitutional: Negative for fever.  Cardiovascular: Negative for chest pain.  Genitourinary: Negative for dysuria, frequency and urgency.      Objective:  Physical Exam BP 138/72   Pulse 62   Ht 5\' 5"  (1.651 m)   Wt 167 lb 6.4 oz (75.9 kg)   BMI 27.86 kg/m  Physical Exam Vitals and nursing note reviewed. Exam conducted with a chaperone present.  Constitutional:      Appearance: Normal appearance.  Genitourinary:    Comments: Normal anatomy overall. External exam notable for assymetry of labia, L>R. Also notable for genital warts on labia, perineum, anus.  Postmenopausal changes notes, vaginal atrophy noted. No abnormal discharge.  Skin:    General: Skin is warm and dry.  Neurological:     Mental Status: She is alert.  Psychiatric:        Mood and Affect: Mood normal.        Behavior: Behavior normal.       Labs and Imaging No results found for this or any previous visit (from the past 24 hour(s)).  No results found.   Assessment & Plan:   Recurrent BV -discussed alternative treatment options instead of Flagyl. Plan to trial clindamycin gel and f/u in 4-6 weeks. If recurs will discuss suppressive  therapy and/or consideration of boric acid.   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 She had never been told of these findings before. Answered all questions and provided additional handout of resources.   Shannon Berlin, MD 08/15/2020 3:57 PM    Shannon Skeans, MD [facility] Family Medicine Fellow, [facility] for [facility], [place]

## 2020-08-16 ENCOUNTER — Other Ambulatory Visit: Payer: Self-pay | Admitting: Obstetrics and Gynecology

## 2020-08-16 ENCOUNTER — Encounter: Payer: No Typology Code available for payment source | Admitting: Family Medicine

## 2020-08-16 ENCOUNTER — Other Ambulatory Visit: Payer: Self-pay

## 2020-08-16 LAB — CERVICOVAGINAL ANCILLARY ONLY
Bacterial Vaginitis (gardnerella): NEGATIVE
Candida Glabrata: NEGATIVE
Candida Vaginitis: NEGATIVE
Chlamydia: NEGATIVE
Comment: NEGATIVE
Comment: NEGATIVE
Comment: NEGATIVE
Comment: NEGATIVE
Comment: NEGATIVE
Comment: NORMAL
Neisseria Gonorrhea: NEGATIVE
Trichomonas: NEGATIVE

## 2020-08-16 MED ORDER — METRONIDAZOLE 0.75 % VA GEL: 1.0000 | Freq: Two times a day (BID) | VAGINAL | 0 refills | Status: DC

## 2020-08-16 MED FILL — METRONIDAZOLE 0.75 % GEL: 0.75 | 5 days supply | Qty: 70 | Fill #0

## 2020-08-16 NOTE — Telephone Encounter (Signed)
Received notification from [facility] that they were currently out of the Cleocin vaginal cream however we can prescibe the Metrogel if it was to treat BV.  Per chart review, pt was prescribed Cleocin for BV.  I informed them due to being self pay we can try the Metrogel.  Medication e-prescribed.  Pharmacy informed me that they will notify the patient and if the Metrogel does not work then to give our office a call back.  I agreed.    Mel Almond, RN  08/16/20

## 2020-08-19 MED FILL — HYDROCHLOROTHIAZIDE 25 MG T: 25 | 90 days supply | Qty: 90 | Fill #1

## 2020-09-06 ENCOUNTER — Ambulatory Visit: Payer: No Typology Code available for payment source | Admitting: Internal Medicine

## 2020-09-20 ENCOUNTER — Other Ambulatory Visit: Payer: Self-pay

## 2020-09-20 ENCOUNTER — Ambulatory Visit (INDEPENDENT_AMBULATORY_CARE_PROVIDER_SITE_OTHER): Payer: Self-pay | Admitting: Obstetrics and Gynecology

## 2020-09-20 ENCOUNTER — Encounter: Payer: Self-pay | Admitting: Obstetrics and Gynecology

## 2020-09-20 VITALS — BP 132/87 | HR 63 | Wt 162.6 lb

## 2020-09-20 DIAGNOSIS — B9689 Other specified bacterial agents as the cause of diseases classified elsewhere: Secondary | ICD-10-CM

## 2020-09-20 DIAGNOSIS — N760 Acute vaginitis: Secondary | ICD-10-CM

## 2020-09-20 DIAGNOSIS — A630 Anogenital (venereal) warts: Secondary | ICD-10-CM

## 2020-09-20 NOTE — Progress Notes (Signed)
   History:  [name] is a 65 y.o. G3P2010 who presents to clinic today for follow up for recurrent BV.   Seen one month ago for having issues w recurrent BV. Treated w topical clindamycin and reports good effect. No recurrent issues. Has not had intercourse since. She is also trying to eat healthier - feels like this is having positive impact on symptoms and general health. Feels improved overall.    The following portions of the patient's history were reviewed and updated as appropriate: allergies, current medications, family history, past medical history, social history, past surgical history and problem list.  Review of Systems:  Review of Systems  Constitutional: Negative for chills and fever.  Genitourinary: Negative for frequency.  Skin: Negative for itching and rash.      Objective:  Physical Exam BP 132/87   Pulse 63   Wt 162 lb 9.6 oz (73.8 kg)   BMI 27.06 kg/m  Physical Exam Vitals and nursing note reviewed.  Constitutional:      Appearance: Normal appearance.  Cardiovascular:     Rate and Rhythm: Normal rate.  Pulmonary:     Effort: Pulmonary effort is normal.  Abdominal:     Palpations: Abdomen is soft.  Skin:    General: Skin is warm and dry.  Neurological:     Mental Status: She is alert.  Psychiatric:        Mood and Affect: Mood normal.        Behavior: Behavior normal.       Labs and Imaging No results found for this or any previous visit (from the past 24 hour(s)).  No results found.   Assessment & Plan:  1. Bacterial vaginitis Did well w treatment of topical clinda, if symptomatic in future would use this as first line for her instead of oral flagyl. Encouraged to keep up good work with eating more fruits/veg, healthier diet.   2. 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 Answered all patient questions .  Janet Berlin, MD 09/20/2020 8:51 AM    Sharene Skeans, MD [facility] Family Medicine  Fellow, [facility] for [facility], [place]

## 2020-09-26 ENCOUNTER — Other Ambulatory Visit: Payer: Self-pay

## 2020-09-26 ENCOUNTER — Ambulatory Visit: Payer: Self-pay | Attending: Internal Medicine | Admitting: Internal Medicine

## 2020-09-26 DIAGNOSIS — Z5329 Procedure and treatment not carried out because of patient's decision for other reasons: Secondary | ICD-10-CM

## 2020-09-26 DIAGNOSIS — Z91199 Patient's noncompliance with other medical treatment and regimen due to unspecified reason: Secondary | ICD-10-CM

## 2020-09-26 NOTE — Progress Notes (Signed)
No show

## 2020-10-03 ENCOUNTER — Ambulatory Visit: Payer: No Typology Code available for payment source | Admitting: Internal Medicine

## 2020-10-04 ENCOUNTER — Ambulatory Visit: Payer: No Typology Code available for payment source | Admitting: Internal Medicine

## 2020-11-21 ENCOUNTER — Encounter (INDEPENDENT_AMBULATORY_CARE_PROVIDER_SITE_OTHER): Payer: Self-pay

## 2020-11-21 ENCOUNTER — Other Ambulatory Visit: Payer: Self-pay

## 2020-11-21 ENCOUNTER — Encounter: Payer: Self-pay | Admitting: Internal Medicine

## 2020-11-21 ENCOUNTER — Ambulatory Visit: Payer: Self-pay | Attending: Internal Medicine | Admitting: Internal Medicine

## 2020-11-21 VITALS — BP 134/79 | HR 69 | Resp 16 | Wt 156.0 lb

## 2020-11-21 DIAGNOSIS — Z86010 Personal history of colonic polyps: Secondary | ICD-10-CM

## 2020-11-21 DIAGNOSIS — A630 Anogenital (venereal) warts: Secondary | ICD-10-CM

## 2020-11-21 DIAGNOSIS — I10 Essential (primary) hypertension: Secondary | ICD-10-CM

## 2020-11-21 MED ORDER — HYDROCHLOROTHIAZIDE 25 MG PO TABS
ORAL_TABLET | Freq: Every day | ORAL | 3 refills | Status: DC
  Filled 2020-11-21: qty 30, 30d supply, fill #0
  Filled 2021-01-24: qty 90, 90d supply, fill #1
  Filled 2021-05-01: qty 90, 90d supply, fill #2
  Filled 2021-08-05: qty 90, 90d supply, fill #3
  Filled 2021-08-07: qty 90, 90d supply, fill #0

## 2020-11-21 MED ORDER — IMIQUIMOD 5 % EX CREA
TOPICAL_CREAM | CUTANEOUS | 0 refills | Status: DC
  Filled 2020-11-21: qty 12, 28d supply, fill #0

## 2020-11-21 NOTE — Progress Notes (Signed)
Patient ID: Fenix Ruppe, female    DOB: [birth year]  MRN: [id number]  CC: Hypertension   Subjective: Shannon [name] is a 65 y.o. female who presents for chronoc ds management Her concerns today include:  Pt with hx of HTN,former smoker,HL,IBS (on Celexa for this with overlay of anxiety), GERD, seasonal allergies.  Saw GYN Dr. Berniece Andreas for recurrent bacterial vaginosis.  Given Clinda gel with good results.  Diagnosed with vaginal warts.  However she states that no treatment was offered.  She is not sexually active.  HTN: taking HCTZ.  Limits salt. No device to check blood pressure.  No chest pains or shortness of breath.  No lower extremity edema.  HM:  Did not get COVID booster and does not plan to get it.  States that she felt really sick after getting the second shot..  History of colon polyps.  She is due for 5-year repeat colonoscopy. Patient Active Problem List   Diagnosis Date Noted  . Mixed hyperlipidemia 06/29/2019  . Screening breast examination 01/27/2019  . Seasonal allergies 11/11/2017  . Plantar fascial fibromatosis of both feet 05/20/2017  . Irritable bowel syndrome without diarrhea 03/11/2017  . Gastroesophageal reflux disease without esophagitis 03/11/2017  . Postmenopausal postcoital bleeding 08/27/2016  . HTN (hypertension), benign 04/02/2016  . Vitamin D deficiency 10/10/2015  . Chronic knee pain 10/11/2014  . Eczema 10/11/2014  . History of right knee surgery 10/11/2014     Current Outpatient Medications on File Prior to Visit  Medication Sig Dispense Refill  . acetic acid-hydrocortisone (VOSOL-HC) OTIC solution 4 drops both ears every night for 2 wks PRN (Patient not taking: No sig reported) 10 mL 0  . Ascorbic Acid (VITAMIN C) 1000 MG tablet Take 1,000 mg by mouth once a week.    . Cholecalciferol (VITAMIN D) 2000 units tablet Take 2,000 Units by mouth once a week.    . COD LIVER OIL PO Take 1 capsule by mouth once a week.    . diclofenac Sodium  (VOLTAREN) 1 % GEL APPLY 2 G TOPICALLY 4 (FOUR) TIMES DAILY. (Patient not taking: Reported on 09/20/2020) 100 g 3  . fluticasone (FLONASE) 50 MCG/ACT nasal spray PLACE 2 SPRAYS INTO BOTH NOSTRILS EVERY DAY 16 g 2  . Homeopathic Products (CACTUS COMPOSITUM PO) Take 1 capsule by mouth once a week. Reported on 10/10/2015 (Patient not taking: Reported on 09/20/2020)    . hydrochlorothiazide (HYDRODIURIL) 25 MG tablet TAKE 1 TABLET (25 MG TOTAL) BY MOUTH DAILY. 90 tablet 3  . Omega-3 Fatty Acids (FISH OIL PO) Take 1 tablet by mouth daily.     No current facility-administered medications on file prior to visit.    No Known Allergies  Social History   Socioeconomic History  . Marital status: Single    Spouse name: Not on file  . Number of children: 2  . Years of education: Not on file  . Highest education level: Some college, no degree  Occupational History  . Not on file  Tobacco Use  . Smoking status: Former Smoker    Packs/day: 0.25    Years: 20.00    Pack years: 5.00    Quit date: 08/01/2015    Years since quitting: 5.3  . Smokeless tobacco: Never Used  Vaping Use  . Vaping Use: Never used  Substance and Sexual Activity  . Alcohol use: Yes    Alcohol/week: 6.0 standard drinks    Types: 6 Shots of liquor per week    Comment: 2-3  drinks per night of weekends per pt.  . Drug use: No  . Sexual activity: Not Currently    Birth control/protection: Surgical  Other Topics Concern  . Not on file  Social History Narrative  . Not on file   Social Determinants of Health   Financial Resource Strain: Not on file  Food Insecurity: No Food Insecurity  . Worried About Charity fundraiser in the Last Year: Never true  . Ran Out of Food in the Last Year: Never true  Transportation Needs: No Transportation Needs  . Lack of Transportation (Medical): No  . Lack of Transportation (Non-Medical): No  Physical Activity: Not on file  Stress: Not on file  Social Connections: Not on file  Intimate  Partner Violence: Not on file    Family History  Problem Relation Age of Onset  . Colon cancer Father 41  . Cancer Father   . Cancer Maternal Grandmother   . Breast cancer Cousin     Past Surgical History:  Procedure Laterality Date  . ABDOMINAL HYSTERECTOMY    . ANTERIOR CRUCIATE LIGAMENT REPAIR  1995  . BREAST EXCISIONAL BIOPSY Bilateral    benign  . CESAREAN SECTION     x2  . PARTIAL HYSTERECTOMY     TAH    ROS: Review of Systems Negative except as stated above  PHYSICAL EXAM: BP 134/79   Pulse 69   Resp 16   Wt 156 lb (70.8 kg)   SpO2 96%   BMI 25.96 kg/m    Wt Readings from Last 3 Encounters:  11/21/20 156 lb (70.8 kg)  09/20/20 162 lb 9.6 oz (73.8 kg)  08/15/20 167 lb 6.4 oz (75.9 kg)    Physical Exam  General appearance - alert, well appearing, and in no distress Mental status - normal mood, behavior, speech, dress, motor activity, and thought processes Chest - clear to auscultation, no wheezes, rales or rhonchi, symmetric air entry Heart - normal rate, regular rhythm, normal S1, S2, no murmurs, rubs, clicks or gallops Pelvic -RN Jeremy Johann present: Introitus area is atrophic.  Noted to have 2 small warts on the left labia lower edge. Extremities -no lower extremity edema CMP Latest Ref Rng & Units 02/01/2020 02/23/2019 11/11/2017  Glucose 65 - 99 mg/dL 105(H) 85 97  BUN 8 - 27 mg/dL 11 11 13   Creatinine 0.57 - 1.00 mg/dL 0.81 0.71 0.69  Sodium 134 - 144 mmol/L 145(H) 142 142  Potassium 3.5 - 5.2 mmol/L 3.6 3.8 3.5  Chloride 96 - 106 mmol/L 103 98 99  CO2 20 - 29 mmol/L 28 27 27   Calcium 8.7 - 10.3 mg/dL 9.6 9.8 9.7  Total Protein 6.0 - 8.5 g/dL 7.1 7.0 7.2  Total Bilirubin 0.0 - 1.2 mg/dL 0.4 0.4 0.3  Alkaline Phos 48 - 121 IU/L 80 89 96  AST 0 - 40 IU/L 24 19 18   ALT 0 - 32 IU/L 28 18 24    Lipid Panel     Component Value Date/Time   CHOL 221 (H) 02/01/2020 1630   TRIG 139 02/01/2020 1630   HDL 51 02/01/2020 1630   CHOLHDL 4.3 02/01/2020  1630   CHOLHDL 3.7 Ratio 09/10/2008 2037   VLDL 14 09/10/2008 2037   LDLCALC 145 (H) 02/01/2020 1630    CBC    Component Value Date/Time   WBC 7.1 02/01/2020 1630   WBC 4.9 10/12/2015 0931   RBC 4.89 02/01/2020 1630   RBC 4.67 10/12/2015 0931   HGB 14.3 02/01/2020  1630   HCT 42.5 02/01/2020 1630   PLT 220 02/01/2020 1630   MCV 87 02/01/2020 1630   MCH 29.2 02/01/2020 1630   MCH 28.5 10/12/2015 0931   MCHC 33.6 02/01/2020 1630   MCHC 33.8 10/12/2015 0931   RDW 12.9 02/01/2020 1630   LYMPHSABS 1.9 10/12/2015 0931   MONOABS 0.6 10/12/2015 0931   EOSABS 0.1 10/12/2015 0931   BASOSABS 0.0 10/12/2015 0931    ASSESSMENT AND PLAN: 1. Essential hypertension Goal.  Continue current medicine. - hydrochlorothiazide (HYDRODIURIL) 25 MG tablet; TAKE 1 TABLET (25 MG TOTAL) BY MOUTH DAILY.  Dispense: 90 tablet; Refill: 3  2. Genital warts Discussed trial of treatment with Aldara cream.  Went over how the medication works and that it can cause some skin irritation.  Recommend applying it 3 times a week and leave on for 6 to 8 hours then wash off with warm water and soap.  She can use up to 16 weeks.  However if they resolve before then she can discontinue using them.  If she has increased skin irritation including redness, patient told to stop the medication for 2 weeks then try restarting it. - imiquimod (ALDARA) 5 % cream; Apply topically 3 (three) times a week. Wash off after 6-8 hrs with warm water and soap. Use until warts resolve or max of 16 wks.  Dispense: 12 each; Refill: 0  3. History of colonic polyps - Ambulatory referral to Gastroenterology    Patient was given the opportunity to ask questions.  Patient verbalized understanding of the plan and was able to repeat key elements of the plan.   No orders of the defined types were placed in this encounter.    Requested Prescriptions    No prescriptions requested or ordered in this encounter    No follow-ups on  file.  Karle Plumber, MD, FACP

## 2020-11-21 NOTE — Patient Instructions (Signed)
Genital Warts  Genital warts are a common STI (sexually transmitted infection). They are small warts in the area around the genitals or the opening of the butt (anus). They sometimes cause pain. Genital warts are easily passed to other people through sex. Many people do not know that they are infected. They may be infected for years without symptoms. Even without symptoms, they can pass the infection to their sex partners. What are the causes? Genital warts are caused by a virus called HPV. HPV spreads from person to person during sex. It can be spread through vaginal, anal, and oral sex. What increases the risk?  Having sex without using a condom.  Having sex with many people.  Having sex before the age of 16.  Being a female who is not circumcised.  Having a female sex partner who is not circumcised.  Having a weak body defense system (immune system). What are the signs or symptoms?  Small warts in the area around the genitals or the opening of the butt. These warts often grow in clusters.  Itching and irritation in the genital area or the area around the opening of the butt.  Bleeding from the warts.  Pain during sex. How is this treated?  Medicines, such as creams, that are put on the skin.  Procedures, such as: ? Freezing the warts. ? Burning the warts. ? Having surgery to get rid of the warts. Getting treatment is important because genital warts can lead to other problems. In females, the virus that causes the warts may increase the risk for cancer of the cervix. Follow these instructions at home: Medicines  Apply over-the-counter and prescription medicines only as told by your doctor.  Do not use medicines that are meant for treating hand warts.  Talk with your doctor about using creams to treat itching.   Instructions for women  Get regular tests to check for cancer of the cervix. This type of cancer grows slowly and can almost always be cured if it is found  early.  If you become pregnant, tell your doctor that you have had genital warts. General instructions  Do not touch or scratch the warts.  Do not have sex until your treatment is done.  Tell your current and past sex partners about your condition. They may need treatment.  After treatment, use condoms during sex.  Keep all follow-up visits as told by your doctor. This is important. How is this prevented? Talk with your doctor about getting the HPV shot. The HPV shot:  Can help stop some HPV infections and cancers.  Is given to males and females who are 11-26 years old.  Is not recommended for pregnant women.  Will not work if you already have HPV. Contact a doctor if:  You have redness, swelling, or pain in the area of the treated skin.  You have a fever.  You feel sick.  You have lumps or have bleeding in the area around your genitals or the opening of your butt.  You have pain during sex.  You have bleeding after sex. Summary  Genital warts are a common STI. They are small warts in the area around the genitals or the opening of the butt (anus).  A virus called HPV causes genital warts.  The virus is spread by having sex without using a condom.  Getting treatment is important.  This condition is treated using medicines. Freezing, burning, or surgery may be done to get rid of the warts. This information   is not intended to replace advice given to you by your health care provider. Make sure you discuss any questions you have with your health care provider. Document Revised: 05/21/2019 Document Reviewed: 05/21/2019 Elsevier Patient Education  2021 Elsevier Inc.  

## 2021-01-24 ENCOUNTER — Other Ambulatory Visit: Payer: Self-pay

## 2021-01-25 ENCOUNTER — Other Ambulatory Visit: Payer: Self-pay

## 2021-02-06 ENCOUNTER — Ambulatory Visit: Payer: No Typology Code available for payment source

## 2021-03-06 ENCOUNTER — Encounter (INDEPENDENT_AMBULATORY_CARE_PROVIDER_SITE_OTHER): Payer: Self-pay

## 2021-03-06 ENCOUNTER — Ambulatory Visit: Payer: Self-pay | Attending: Internal Medicine

## 2021-03-06 ENCOUNTER — Other Ambulatory Visit: Payer: Self-pay

## 2021-03-30 ENCOUNTER — Telehealth: Payer: Self-pay | Admitting: Internal Medicine

## 2021-03-30 NOTE — Telephone Encounter (Signed)
I return Pt call, she was inform, still waiting for [facility] to review her CAFA application

## 2021-03-30 NOTE — Telephone Encounter (Signed)
Copied from [place] [id number]. Topic: General - Other >> Mar 30, 2021 12:11 PM Bayard [facility] wrote: Reason for CRM: patient called in to speak with Shannon [name] about her financial letter. Please call back

## 2021-04-03 ENCOUNTER — Ambulatory Visit (INDEPENDENT_AMBULATORY_CARE_PROVIDER_SITE_OTHER): Payer: Self-pay | Admitting: Nurse Practitioner

## 2021-04-03 ENCOUNTER — Other Ambulatory Visit: Payer: Self-pay

## 2021-04-03 ENCOUNTER — Telehealth: Payer: No Typology Code available for payment source | Admitting: Internal Medicine

## 2021-04-03 VITALS — BP 137/86 | HR 60 | Temp 97.9°F | Resp 20 | Ht 65.0 in | Wt 151.4 lb

## 2021-04-03 DIAGNOSIS — Z8619 Personal history of other infectious and parasitic diseases: Secondary | ICD-10-CM

## 2021-04-03 DIAGNOSIS — M542 Cervicalgia: Secondary | ICD-10-CM

## 2021-04-03 DIAGNOSIS — L299 Pruritus, unspecified: Secondary | ICD-10-CM

## 2021-04-03 DIAGNOSIS — G8929 Other chronic pain: Secondary | ICD-10-CM

## 2021-04-03 MED ORDER — HYDROCORTISONE-ACETIC ACID 1-2 % OT SOLN
OTIC | 0 refills | Status: DC
  Filled 2021-04-03: qty 10, 20d supply, fill #0

## 2021-04-03 MED ORDER — DICLOFENAC SODIUM 1 % EX GEL
2.0000 g | Freq: Four times a day (QID) | CUTANEOUS | 3 refills | Status: DC
  Filled 2021-04-03: qty 100, 13d supply, fill #0

## 2021-04-03 NOTE — Progress Notes (Signed)
$'@Patient'Q$  ID: Shannon [name], female    DOB: 11-23-1955, 65 y.o.   MRN: [id number]  Chief Complaint  Patient presents with   Genital Warts    Referring provider: Ladell Pier, MD   HPI  Patient presents today for recheck of genital warts. At her last visit with Dr. Wynetta Emery she was prescribed imiquimod topical for treatment. Patient just wanted a recheck.  Upon exam she does look clear at this time.  We discussed that she does need to follow-up with her OB/GYN for this matter. Patient is also requesting refills for Voltaren gel and ear drops. Denies f/c/s, n/v/d, hemoptysis, PND, chest pain or edema    No Known Allergies  Immunization History  Administered Date(s) Administered   PFIZER(Purple Top)SARS-COV-2 Vaccination 10/12/2019, 11/02/2019   Tdap 04/02/2016    Past Medical History:  Diagnosis Date   Arthritis    BV (bacterial vaginosis)    Hypertension     Tobacco History: Social History   Tobacco Use  Smoking Status Former   Packs/day: 0.25   Years: 20.00   Pack years: 5.00   Types: Cigarettes   Quit date: 08/01/2015   Years since quitting: 5.6  Smokeless Tobacco Never   Counseling given: Not Answered   Outpatient Encounter Medications as of 04/03/2021  Medication Sig   acetic acid-hydrocortisone (VOSOL-HC) OTIC solution 4 drops both ears every night for 2 wks PRN   Ascorbic Acid (VITAMIN C) 1000 MG tablet Take 1,000 mg by mouth once a week.   Cholecalciferol (VITAMIN D) 2000 units tablet Take 2,000 Units by mouth once a week.   COD LIVER OIL PO Take 1 capsule by mouth once a week.   diclofenac Sodium (VOLTAREN) 1 % GEL APPLY 2 G TOPICALLY 4 (FOUR) TIMES DAILY.   fluticasone (FLONASE) 50 MCG/ACT nasal spray PLACE 2 SPRAYS INTO BOTH NOSTRILS EVERY DAY   Homeopathic Products (CACTUS COMPOSITUM PO) Take 1 capsule by mouth once a week. Reported on 10/10/2015 (Patient not taking: Reported on 09/20/2020)   hydrochlorothiazide (HYDRODIURIL) 25 MG tablet TAKE 1  TABLET (25 MG TOTAL) BY MOUTH DAILY.   imiquimod (ALDARA) 5 % cream Apply topically 3 (three) times a week. Wash off after 6-8 hrs with warm water and soap. Use until warts resolve or max of 16 wks.   Omega-3 Fatty Acids (FISH OIL PO) Take 1 tablet by mouth daily.   [DISCONTINUED] acetic acid-hydrocortisone (VOSOL-HC) OTIC solution 4 drops both ears every night for 2 wks PRN (Patient not taking: No sig reported)   [DISCONTINUED] diclofenac Sodium (VOLTAREN) 1 % GEL APPLY 2 G TOPICALLY 4 (FOUR) TIMES DAILY. (Patient not taking: Reported on 09/20/2020)   No facility-administered encounter medications on file as of 04/03/2021.     Review of Systems  Review of Systems  Constitutional: Negative.   HENT: Negative.    Cardiovascular: Negative.   Gastrointestinal: Negative.   Allergic/Immunologic: Negative.   Neurological: Negative.   Psychiatric/Behavioral: Negative.        Physical Exam  BP 137/86 (BP Location: Left Arm, Patient Position: Sitting, Cuff Size: Normal)   Pulse 60   Temp 97.9 F (36.6 C)   Resp 20   Ht '5\' 5"'$  (1.651 m)   Wt 151 lb 6.4 oz (68.7 kg)   SpO2 95%   BMI 25.19 kg/m   Wt Readings from Last 5 Encounters:  04/03/21 151 lb 6.4 oz (68.7 kg)  11/21/20 156 lb (70.8 kg)  09/20/20 162 lb 9.6 oz (73.8 kg)  08/15/20  167 lb 6.4 oz (75.9 kg)  02/01/20 168 lb 6.4 oz (76.4 kg)     Physical Exam Vitals and nursing note reviewed.  Constitutional:      General: She is not in acute distress.    Appearance: She is well-developed.  Cardiovascular:     Rate and Rhythm: Normal rate and regular rhythm.  Pulmonary:     Effort: Pulmonary effort is normal.     Breath sounds: Normal breath sounds.  Neurological:     Mental Status: She is alert and oriented to person, place, and time.      Assessment & Plan:   History of genital warts Genital Warts:  Exam clear today   Follow up:  Follow up in 3 months with OB/GYN  Follow up as scheduled with Dr.  Maurie Boettcher, NP 04/04/2021

## 2021-04-03 NOTE — Patient Instructions (Signed)
Genital Warts:  Exam clear today   Follow up:  Follow up in 3 months with OB/GYN  Follow up as scheduled with Dr. Wynetta Emery

## 2021-04-04 ENCOUNTER — Other Ambulatory Visit: Payer: Self-pay

## 2021-04-04 ENCOUNTER — Encounter: Payer: Self-pay | Admitting: Nurse Practitioner

## 2021-04-04 DIAGNOSIS — Z8619 Personal history of other infectious and parasitic diseases: Secondary | ICD-10-CM | POA: Insufficient documentation

## 2021-04-04 NOTE — Assessment & Plan Note (Signed)
Genital Warts:  Exam clear today   Follow up:  Follow up in 3 months with OB/GYN  Follow up as scheduled with Dr. Wynetta Emery

## 2021-04-05 ENCOUNTER — Other Ambulatory Visit: Payer: Self-pay

## 2021-04-18 ENCOUNTER — Telehealth: Payer: Self-pay | Admitting: Internal Medicine

## 2021-04-18 NOTE — Telephone Encounter (Signed)
Returned pt call. Made pt aware that she doesn't need a referral to the OBGYN because she is already established.  Pt states she understand and pt was given info for GI provider

## 2021-04-18 NOTE — Telephone Encounter (Signed)
Copied from [place] [phone]. Topic: Referral - Request for Referral >> Apr 12, 2021 11:20 AM Erick Blinks wrote: Has patient seen PCP for this complaint? Yes.   *If NO, is insurance requiring patient see PCP for this issue before PCP can refer them? Referral for which specialty: Gynecology  Preferred provider/office: [facility]  Reason for referral: Needs referral for annual check up

## 2021-05-01 ENCOUNTER — Other Ambulatory Visit: Payer: Self-pay

## 2021-06-11 ENCOUNTER — Encounter: Payer: Self-pay | Admitting: Gastroenterology

## 2021-07-18 ENCOUNTER — Encounter: Payer: Self-pay | Admitting: Gastroenterology

## 2021-07-19 ENCOUNTER — Telehealth: Payer: Self-pay | Admitting: Internal Medicine

## 2021-07-19 NOTE — Telephone Encounter (Signed)
Copied from [place] [phone]. Topic: General - Other >> Jul 19, 2021 11:18 AM Alanda Slim E wrote: Reason for CRM: Pt orange card expires on Feb 15th and pt has a GI appt on Feb 13th/ Pt would like to know when she needs to call in to renew this card and set an appt with Carlos/ pt also has a women's health appt on Feb 6th/ please advise

## 2021-07-25 NOTE — Telephone Encounter (Signed)
I return Pt call, she was inform that is too soon to reapply

## 2021-08-07 ENCOUNTER — Other Ambulatory Visit: Payer: Self-pay

## 2021-08-07 ENCOUNTER — Ambulatory Visit: Payer: Self-pay | Attending: Internal Medicine | Admitting: Internal Medicine

## 2021-08-07 DIAGNOSIS — I10 Essential (primary) hypertension: Secondary | ICD-10-CM

## 2021-08-07 DIAGNOSIS — Z2821 Immunization not carried out because of patient refusal: Secondary | ICD-10-CM

## 2021-08-07 MED ORDER — HYDROCHLOROTHIAZIDE 25 MG PO TABS
ORAL_TABLET | Freq: Every day | ORAL | 3 refills | Status: DC
  Filled 2021-11-27: qty 90, 90d supply, fill #0
  Filled 2022-02-26: qty 90, 90d supply, fill #1
  Filled 2022-07-05: qty 90, 90d supply, fill #2

## 2021-08-07 NOTE — Progress Notes (Signed)
Patient ID: Shannon [name], female   DOB: 05-09-1956, 66 y.o.   MRN: [id number] Virtual Visit via Telephone Note  I connected with Shannon [name] on 08/07/2021 at 3:31 p.m by telephone and verified that I am speaking with the correct person using two identifiers  Location: Patient: home Provider: office  Participants: Myself Patient   I discussed the limitations, risks, security and privacy concerns of performing an evaluation and management service by telephone and the availability of in person appointments. I also discussed with the patient that there may be a patient responsible charge related to this service. The patient expressed understanding and agreed to proceed.   History of Present Illness: Pt with hx of HTN, former smoker, HL, IBS (on Celexa for this with overlay of anxiety), GERD, seasonal allergies.  She last saw me 11/2020.  HTN:  taking HCTZ daily for BP.  No device to check BP.  Limits salt in her foods.  Reports she eats healthy.  Eats a lot of fruits and veggies; eats whole grain bread.  Very active at home and does a lot of standing at work  Has an upcoming appointment with a gynecologist next month.  She wants to be reassessed for vaginal warts.  In May of last year I had given her Aldara cream to use.  Patient states she used it.  She subsequently saw the nurse practitioner in September.  She did not see any warts at that time.  She states that the nurse practitioner told her that she should follow-up with a gynecologist  HM:  Has appt schedule for colonoscopy 09/04/21.  Declines flu shot and shingrix vaccines  Outpatient Encounter Medications as of 08/07/2021  Medication Sig   acetic acid-hydrocortisone (VOSOL-HC) OTIC solution Use 4 drops in both ears every night for 2 weeks as needed.   Ascorbic Acid (VITAMIN C) 1000 MG tablet Take 1,000 mg by mouth once a week.   Cholecalciferol (VITAMIN D) 2000 units tablet Take 2,000 Units by mouth once a week.   COD LIVER  OIL PO Take 1 capsule by mouth once a week.   diclofenac Sodium (VOLTAREN) 1 % GEL APPLY 2 G TOPICALLY 4 (FOUR) TIMES DAILY.   fluticasone (FLONASE) 50 MCG/ACT nasal spray PLACE 2 SPRAYS INTO BOTH NOSTRILS EVERY DAY   Homeopathic Products (CACTUS COMPOSITUM PO) Take 1 capsule by mouth once a week. Reported on 10/10/2015 (Patient not taking: Reported on 09/20/2020)   hydrochlorothiazide (HYDRODIURIL) 25 MG tablet TAKE 1 TABLET (25 MG TOTAL) BY MOUTH DAILY.   imiquimod (ALDARA) 5 % cream Apply topically 3 (three) times a week. Wash off after 6-8 hrs with warm water and soap. Use until warts resolve or max of 16 wks.   Omega-3 Fatty Acids (FISH OIL PO) Take 1 tablet by mouth daily.   No facility-administered encounter medications on file as of 08/07/2021.      Observations/Objective: No direct observation done as this was a telephone visit.  Assessment and Plan: 1. Essential hypertension Continue hydrochlorothiazide.  We will have her follow-up with the clinical pharmacist in 2 weeks for blood pressure check. - CBC; Future - Comprehensive metabolic panel; Future - Lipid panel; Future - hydrochlorothiazide (HYDRODIURIL) 25 MG tablet; TAKE 1 TABLET (25 MG TOTAL) BY MOUTH DAILY.  Dispense: 90 tablet; Refill: 3  2. Influenza vaccination declined  3.  Patient will keep her upcoming appointment that the gynecologist.  Follow Up Instructions: Follow-up with me in 4 months. Of appointment with clinical pharmacist in 2  weeks for blood pressure check.   I discussed the assessment and treatment plan with the patient. The patient was provided an opportunity to ask questions and all were answered. The patient agreed with the plan and demonstrated an understanding of the instructions.   The patient was advised to call back or seek an in-person evaluation if the symptoms worsen or if the condition fails to improve as anticipated.  I  Spent 16 minutes on this telephone encounter  Karle Plumber,  MD

## 2021-08-21 ENCOUNTER — Other Ambulatory Visit: Payer: Self-pay

## 2021-08-21 ENCOUNTER — Ambulatory Visit (AMBULATORY_SURGERY_CENTER): Payer: Self-pay | Admitting: *Deleted

## 2021-08-21 VITALS — Ht 65.0 in | Wt 155.0 lb

## 2021-08-21 DIAGNOSIS — Z86010 Personal history of colonic polyps: Secondary | ICD-10-CM

## 2021-08-21 DIAGNOSIS — Z800 Family history of malignant neoplasm of digestive organs: Secondary | ICD-10-CM

## 2021-08-21 MED ORDER — POLYETHYLENE GLYCOL 3350 17 GM/SCOOP PO POWD
1.0000 | Freq: Every day | ORAL | 0 refills | Status: DC
  Filled 2021-08-21: qty 238, 1d supply, fill #0

## 2021-08-21 MED ORDER — BISACODYL EC 5 MG PO TBEC
5.0000 mg | DELAYED_RELEASE_TABLET | Freq: Once | ORAL | 0 refills | Status: AC
  Filled 2021-08-21: qty 4, 4d supply, fill #0

## 2021-08-21 NOTE — Progress Notes (Signed)
Patient's pre-visit was done today over the phone with the patient. Name,DOB and address verified. Patient denies any allergies to Eggs and Soy. Patient denies any problems with anesthesia/sedation. Patient is not taking any diet pills or blood thinners. No home Oxygen.   Prep instructions sent to pt's email-pt aware. Patient understands to call us back with any questions or concerns. Patient is aware of our care-partner policy and FIEPP-29 safety protocol.   The patient is COVID-19 vaccinated.

## 2021-08-22 ENCOUNTER — Other Ambulatory Visit: Payer: Self-pay

## 2021-08-28 ENCOUNTER — Other Ambulatory Visit (HOSPITAL_COMMUNITY)
Admission: RE | Admit: 2021-08-28 | Discharge: 2021-08-28 | Disposition: A | Discharge status: Home / Self Care | Payer: Self-pay | Source: Ambulatory Visit | Attending: Family Medicine | Admitting: Family Medicine

## 2021-08-28 ENCOUNTER — Encounter: Payer: Self-pay | Admitting: Family Medicine

## 2021-08-28 ENCOUNTER — Other Ambulatory Visit: Payer: Self-pay

## 2021-08-28 ENCOUNTER — Ambulatory Visit (INDEPENDENT_AMBULATORY_CARE_PROVIDER_SITE_OTHER): Payer: Self-pay | Admitting: Family Medicine

## 2021-08-28 VITALS — BP 150/89 | HR 88 | Wt 162.0 lb

## 2021-08-28 DIAGNOSIS — Z1231 Encounter for screening mammogram for malignant neoplasm of breast: Secondary | ICD-10-CM

## 2021-08-28 DIAGNOSIS — L290 Pruritus ani: Secondary | ICD-10-CM

## 2021-08-28 DIAGNOSIS — Z0000 Encounter for general adult medical examination without abnormal findings: Secondary | ICD-10-CM

## 2021-08-28 DIAGNOSIS — I10 Essential (primary) hypertension: Secondary | ICD-10-CM

## 2021-08-28 DIAGNOSIS — N898 Other specified noninflammatory disorders of vagina: Secondary | ICD-10-CM | POA: Insufficient documentation

## 2021-08-28 DIAGNOSIS — Z8619 Personal history of other infectious and parasitic diseases: Secondary | ICD-10-CM

## 2021-08-28 NOTE — Patient Instructions (Addendum)
Please follow up with your PCP for blood pressure and ears.   I will send in "zinc oxide" and steroid cream to help with rectal itching. It is okay to try a daily zyrtec to see if that helps too.   Swabs should be back in several days.

## 2021-08-28 NOTE — Progress Notes (Signed)
GYNECOLOGY OFFICE VISIT NOTE  History:   Shannon [name] is a 66 y.o. G3P2010 here today for an annual exam (S/p hysterectomy including cervix for benign reasons) and re-evaluation of genital warts.   She would like a pelvic exam to assess if she has any genital warts present. She was clinically diagnosed in 07/2020 with warts present on labia, perineum, and anus. During 09/2020 visit with her PCP, she received Imiquimod cream for treatment. In follow up on 03/2021 she was noted to have no genital lesions. She would like to be evaluated again today to see if any lesions are present.   At the end of the visit, she also mentioned anal pruritis for the past several years. Normal Bms. Previously tried a cream that worked but can't remember what it was.    Past Medical History:  Diagnosis Date   Arthritis    BV (bacterial vaginosis)    Hypertension     Past Surgical History:  Procedure Laterality Date   ABDOMINAL HYSTERECTOMY     ANTERIOR CRUCIATE LIGAMENT REPAIR  1995   BREAST EXCISIONAL BIOPSY Bilateral    benign   CESAREAN SECTION     x2   COLONOSCOPY  09/05/2015   Dr.Jacobs   COLONOSCOPY  05/2010   PARTIAL HYSTERECTOMY     TAH   POLYPECTOMY      The following portions of the patient's history were reviewed and updated as appropriate: allergies, current medications, past family history, past medical history, past social history, past surgical history and problem list.   Health Maintenance: Normal mammogram on 02/2020.   Review of Systems:  Pertinent items noted in HPI and remainder of comprehensive ROS otherwise negative.  Physical Exam:  BP (!) 150/89    Pulse 88    Wt 162 lb (73.5 kg)    BMI 26.96 kg/m  CONSTITUTIONAL: Well-developed, well-nourished female in no acute distress.  HEENT:  Normocephalic, atraumatic.  NECK: Normal range of motion SKIN: No rash noted. Not diaphoretic. No erythema. No pallor. MUSCULOSKELETAL: Normal range of motion.  NEUROLOGIC: Alert and  oriented to person, place, and time.  PSYCHIATRIC: Normal mood and affect. Normal behavior. CARDIOVASCULAR: Normal heart rate noted RESPIRATORY: Effort normal ABDOMEN: Soft, non-distended  PELVIC: Normal appearing external genitalia with exception of asymmetric labia (L larger than R); normal urethral meatus; normal appearing vaginal mucosa and cuff.  No warts seen. No abnormal discharge noted.  Performed in the presence of a chaperone. Rectal exam: negative without mass, lesions or tenderness, redundant rectal skin tissue without evidence of fissure or significant hemorrhoids.   Labs and Imaging No results found.    Assessment and Plan:   1. Annual physical exam Doing well.   2. History of genital warts No evidence of warts on today's exam. Discussed the possibility of spontaneous resolution and reappearance. It is likely they may recur, but only observation needed in between. Encouraged to follow up if she notices or feels new spots, can always do spot treatment or cream if she desires.   3. Encounter for screening mammogram for malignant neoplasm of breast Scheduled for 2/20. No concerns.  - MM 3D SCREEN BREAST BILATERAL; Future  4. Vaginal irritation - Cervicovaginal ancillary only( [facility])  5. Rectal itching No rash or lesions seen. - liver oil-zinc oxide (DESITIN) 40 % ointment; Apply 1 application topically as needed for irritation.  Dispense: 56.7 g; Refill: 0 - hydrocortisone 1 % ointment; Apply 1 application topically 2 (two) times daily.  Dispense: 30  g; Refill: 0  6. HTN (hypertension), benign Elevated during appointment today, asymptomatic. Taking her HCTZ as prescribed. Encouraged to take BP at home. Patient made aware and has follow up with her PCP.   Follow up annually or sooner if needed.     Darrelyn Hillock, DO  [facility] Fellow, [facility] [place] 08/30/2021 2:49 PM

## 2021-08-29 ENCOUNTER — Other Ambulatory Visit: Payer: Self-pay

## 2021-08-29 LAB — CERVICOVAGINAL ANCILLARY ONLY
Bacterial Vaginitis (gardnerella): NEGATIVE
Candida Glabrata: NEGATIVE
Candida Vaginitis: NEGATIVE
Chlamydia: NEGATIVE
Comment: NEGATIVE
Comment: NEGATIVE
Comment: NEGATIVE
Comment: NEGATIVE
Comment: NEGATIVE
Comment: NORMAL
Neisseria Gonorrhea: NEGATIVE
Trichomonas: NEGATIVE

## 2021-08-29 MED ORDER — ZINC OXIDE 40 % EX OINT
1.0000 "application " | TOPICAL_OINTMENT | CUTANEOUS | 0 refills | Status: DC | PRN
  Filled 2021-08-29: qty 56.7, fill #0

## 2021-08-29 MED ORDER — HYDROCORTISONE 1 % EX OINT
1.0000 "application " | TOPICAL_OINTMENT | Freq: Two times a day (BID) | CUTANEOUS | 0 refills | Status: DC
  Filled 2021-08-29: qty 30, 10d supply, fill #0

## 2021-08-30 ENCOUNTER — Encounter: Payer: Self-pay | Admitting: Family Medicine

## 2021-09-04 ENCOUNTER — Other Ambulatory Visit: Payer: Self-pay

## 2021-09-04 ENCOUNTER — Encounter: Payer: Self-pay | Admitting: Gastroenterology

## 2021-09-04 ENCOUNTER — Ambulatory Visit (AMBULATORY_SURGERY_CENTER): Payer: Self-pay | Admitting: Gastroenterology

## 2021-09-04 VITALS — BP 114/69 | HR 51 | Temp 97.7°F | Resp 12 | Ht 65.0 in | Wt 155.0 lb

## 2021-09-04 DIAGNOSIS — Z86010 Personal history of colonic polyps: Secondary | ICD-10-CM

## 2021-09-04 MED ORDER — SODIUM CHLORIDE 0.9 % IV SOLN: 500.0000 mL | Freq: Once | INTRAVENOUS | Status: DC

## 2021-09-04 NOTE — Progress Notes (Signed)
To Pacu, VSs. Report to [email or web address]

## 2021-09-04 NOTE — Patient Instructions (Signed)
Information on diverticulosis given to you today.  Resume previous diet and medications.  Repeat colonoscopy in 5 years for screening.   YOU HAD AN ENDOSCOPIC PROCEDURE TODAY AT [facility]:   Refer to the procedure report that was given to you for any specific questions about what was found during the examination.  If the procedure report does not answer your questions, please call your gastroenterologist to clarify.  If you requested that your care partner not be given the details of your procedure findings, then the procedure report has been included in a sealed envelope for you to review at your convenience later.  YOU SHOULD EXPECT: Some feelings of bloating in the abdomen. Passage of more gas than usual.  Walking can help get rid of the air that was put into your GI tract during the procedure and reduce the bloating. If you had a lower endoscopy (such as a colonoscopy or flexible sigmoidoscopy) you may notice spotting of blood in your stool or on the toilet paper. If you underwent a bowel prep for your procedure, you may not have a normal bowel movement for a few days.  Please Note:  You might notice some irritation and congestion in your nose or some drainage.  This is from the oxygen used during your procedure.  There is no need for concern and it should clear up in a day or so.  SYMPTOMS TO REPORT IMMEDIATELY:  Following lower endoscopy (colonoscopy or flexible sigmoidoscopy):  Excessive amounts of blood in the stool  Significant tenderness or worsening of abdominal pains  Swelling of the abdomen that is new, acute  Fever of 100F or higher   For urgent or emergent issues, a gastroenterologist can be reached at any hour by calling [phone]. Do not use MyChart messaging for urgent concerns.    DIET:  We do recommend a small meal at first, but then you may proceed to your regular diet.  Drink plenty of fluids but you should avoid alcoholic beverages for 24  hours.  ACTIVITY:  You should plan to take it easy for the rest of today and you should NOT DRIVE or use heavy machinery until tomorrow (because of the sedation medicines used during the test).    FOLLOW UP: Our staff will call the number listed on your records 48-72 hours following your procedure to check on you and address any questions or concerns that you may have regarding the information given to you following your procedure. If we do not reach you, we will leave a message.  We will attempt to reach you two times.  During this call, we will ask if you have developed any symptoms of COVID 19. If you develop any symptoms (ie: fever, flu-like symptoms, shortness of breath, cough etc.) before then, please call [phone].  If you test positive for Covid 19 in the 2 weeks post procedure, please call and report this information to [place].    If any biopsies were taken you will be contacted by phone or by letter within the next 1-3 weeks.  Please call us at [phone] if you have not heard about the biopsies in 3 weeks.    SIGNATURES/CONFIDENTIALITY: You and/or your care partner have signed paperwork which will be entered into your electronic medical record.  These signatures attest to the fact that that the information above on your After Visit Summary has been reviewed and is understood.  Full responsibility of the confidentiality of this discharge information lies with  you and/or your care-partner.  °

## 2021-09-04 NOTE — Op Note (Signed)
[facility] Patient Name: Shannon [name] Procedure Date: 09/04/2021 1:18 PM MRN: [id number] Endoscopist: Milus Banister , MD Age: 66 Referring MD:  Date of Birth: 05/07/1956 Gender: Female Account #: [id number] Procedure:                Colonoscopy Indications:              High risk colon cancer surveillance: Personal                            history of colonic polyps; Colonoscopy 2012 Dr.                            Alice Reichert was normal. Colonoscopy 08/2015 Dr. Ardis Hughs                            found 2 subcentimeter polyps. One was a tubular                            adenoma, the other was a SSP. Father had colon                            cancer in his 56s Medicines:                Monitored Anesthesia Care Procedure:                Pre-Anesthesia Assessment:                           - Prior to the procedure, a History and Physical                            was performed, and patient medications and                            allergies were reviewed. The patient's tolerance of                            previous anesthesia was also reviewed. The risks                            and benefits of the procedure and the sedation                            options and risks were discussed with the patient.                            All questions were answered, and informed consent                            was obtained. Prior Anticoagulants: The patient has                            taken no previous anticoagulant or antiplatelet  agents. ASA Grade Assessment: II - A patient with                            mild systemic disease. After reviewing the risks                            and benefits, the patient was deemed in                            satisfactory condition to undergo the procedure.                           After obtaining informed consent, the colonoscope                            was passed under direct vision. Throughout the                             procedure, the patient's blood pressure, pulse, and                            oxygen saturations were monitored continuously. The                            CF [id number] #[id number] was introduced through the anus                            and advanced to the the cecum, identified by                            appendiceal orifice and ileocecal valve. The                            colonoscopy was performed without difficulty. The                            patient tolerated the procedure well. The quality                            of the bowel preparation was good. The ileocecal                            valve, appendiceal orifice, and rectum were                            photographed. Scope In: 1:24:35 PM Scope Out: 1:35:44 PM Scope Withdrawal Time: 0 hours 8 minutes 47 seconds  Total Procedure Duration: 0 hours 11 minutes 9 seconds  Findings:                 Multiple small and large-mouthed diverticula were                            found in the left colon.  The exam was otherwise without abnormality on                            direct and retroflexion views. Complications:            No immediate complications. Estimated blood loss:                            None. Estimated Blood Loss:     Estimated blood loss: none. Impression:               - Diverticulosis in the left colon.                           - The examination was otherwise normal on direct                            and retroflexion views.                           - No polyps or cancers. Recommendation:           - Patient has a contact number available for                            emergencies. The signs and symptoms of potential                            delayed complications were discussed with the                            patient. Return to normal activities tomorrow.                            Written discharge instructions were provided to the                             patient.                           - Resume previous diet.                           - Continue present medications.                           - Repeat colonoscopy in 5 years for screening. Milus Banister, MD 09/04/2021 1:38:16 PM This report has been signed electronically.

## 2021-09-04 NOTE — Progress Notes (Signed)
HPI: This is a woman with h/o polyps and FH of colon cancer  Colonoscopy 2012 Dr. Alice Reichert was normal.  Colonoscopy 08/2015 Dr. Ardis Hughs found 2 subcentimeter polyps.  One was a tubular adenoma, the other was a SSP.  Father had colon cancer in his 53s    ROS: complete GI ROS as described in HPI, all other review negative.  Constitutional:  No unintentional weight loss   Past Medical History:  Diagnosis Date   Arthritis    BV (bacterial vaginosis)    Hypertension     Past Surgical History:  Procedure Laterality Date   ABDOMINAL HYSTERECTOMY     ANTERIOR CRUCIATE LIGAMENT REPAIR  1995   BREAST EXCISIONAL BIOPSY Bilateral    benign   CESAREAN SECTION     x2   COLONOSCOPY  09/05/2015   Dr.Jamaree Hosier   COLONOSCOPY  05/2010   PARTIAL HYSTERECTOMY     TAH   POLYPECTOMY      Current Outpatient Medications  Medication Sig Dispense Refill   Ascorbic Acid (VITAMIN C) 1000 MG tablet Take 1,000 mg by mouth once a week.     fluticasone (FLONASE) 50 MCG/ACT nasal spray PLACE 2 SPRAYS INTO BOTH NOSTRILS EVERY DAY 16 g 2   hydrochlorothiazide (HYDRODIURIL) 25 MG tablet TAKE 1 TABLET (25 MG TOTAL) BY MOUTH DAILY. 90 tablet 3   hydrocortisone 1 % ointment Apply 1 application topically 2 (two) times daily. 30 g 0   liver oil-zinc oxide (DESITIN) 40 % ointment Apply 1 application topically as needed for irritation. 56.7 g 0   Omega-3 Fatty Acids (FISH OIL PO) Take 1 tablet by mouth daily.     Current Facility-Administered Medications  Medication Dose Route Frequency Provider Last Rate Last Admin   0.9 %  sodium chloride infusion  500 mL Intravenous Once Shannon Banister, MD        Allergies as of 09/04/2021   (No Known Allergies)    Family History  Problem Relation Age of Onset   Colon cancer Father 37   Cancer Father    Cancer Maternal Grandmother    Breast cancer Cousin     Social History   Socioeconomic History   Marital status: Single    Spouse name: Not on file   Number of  children: 2   Years of education: Not on file   Highest education level: Some college, no degree  Occupational History   Not on file  Tobacco Use   Smoking status: Former    Packs/day: 0.25    Years: 20.00    Pack years: 5.00    Types: Cigarettes    Quit date: 08/01/2015    Years since quitting: 6.0   Smokeless tobacco: Never  Vaping Use   Vaping Use: Never used  Substance and Sexual Activity   Alcohol use: Not Currently    Alcohol/week: 2.0 - 3.0 standard drinks    Types: 2 - 3 Cans of beer per week    Comment: 2-3 drinks beer weekend   Drug use: No   Sexual activity: Not Currently    Birth control/protection: Surgical  Other Topics Concern   Not on file  Social History Narrative   Not on file   Social Determinants of Health   Financial Resource Strain: Not on file  Food Insecurity: Not on file  Transportation Needs: Not on file  Physical Activity: Not on file  Stress: Not on file  Social Connections: Not on file  Intimate Partner Violence: Not on file  Physical Exam: BP 138/64    Pulse (!) 54    Temp 97.7 F (36.5 C) (Temporal)    Ht 5\' 5"  (1.651 m)    Wt 155 lb (70.3 kg)    SpO2 100%    BMI 25.79 kg/m  Constitutional: generally well-appearing Psychiatric: alert and oriented x3 Lungs: CTA bilaterally Heart: no MCR  Assessment and plan: 66 y.o. female with family history colon cancer, personal history of adenomatous polyps.  Surveillance colonoscopy today  Care is appropriate for the ambulatory setting.  Shannon Loffler, MD [facility] 09/04/2021, 1:03 PM

## 2021-09-04 NOTE — Progress Notes (Signed)
Pt's states no medical or surgical changes since previsit or office visit. Pt's states no medical or surgical changes since previsit or office visit.   VS DT

## 2021-09-06 ENCOUNTER — Telehealth: Payer: Self-pay

## 2021-09-06 IMAGING — MG DIGITAL SCREENING BILAT W/ TOMO W/ CAD
8 series · 8 of 24 positions shown · non-contrast
Comparison: Previous exam(s).

CLINICAL DATA: Screening.

EXAM:
DIGITAL SCREENING BILATERAL MAMMOGRAM WITH TOMO AND CAD

[L MLO synth-2D]
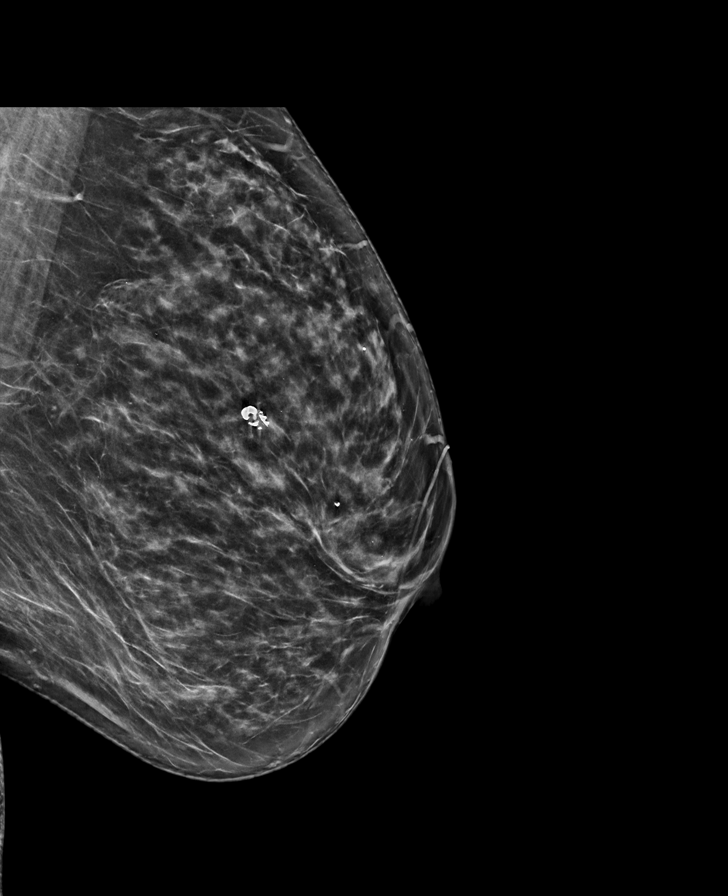

[L CC synth-2D]
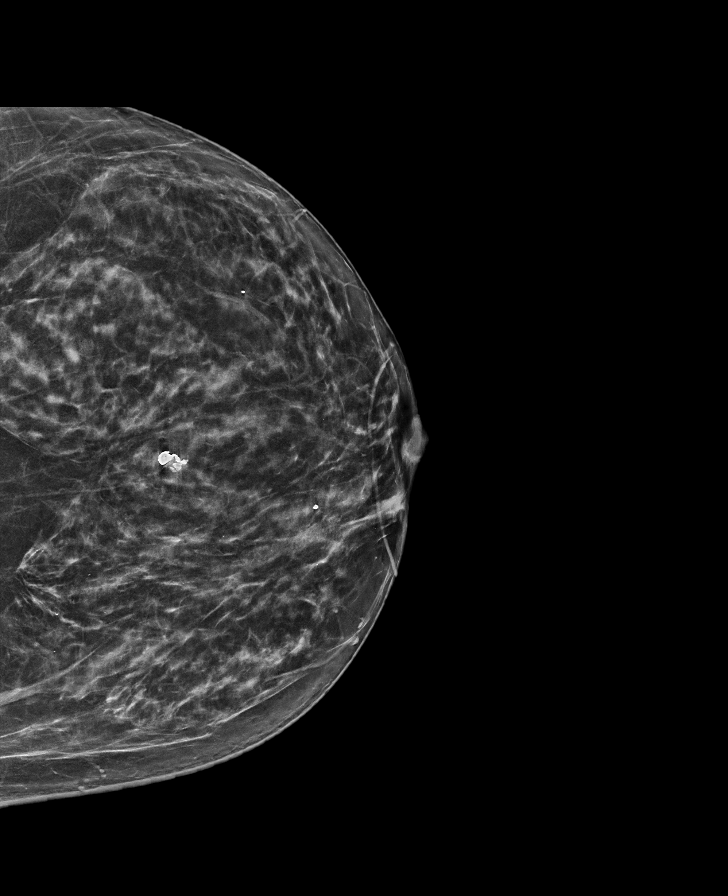

[R MLO synth-2D]
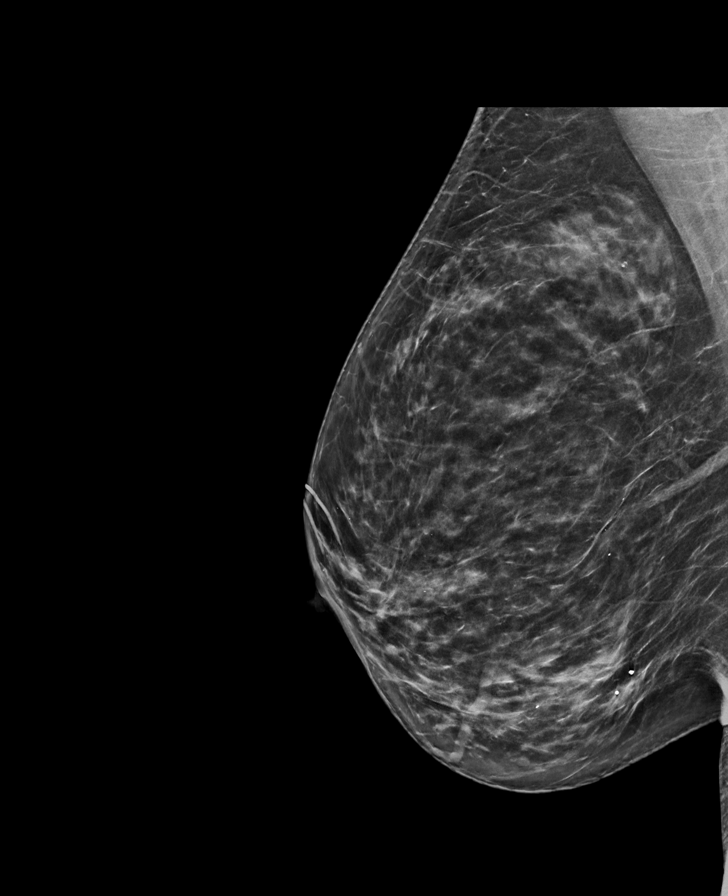

[R CC synth-2D]
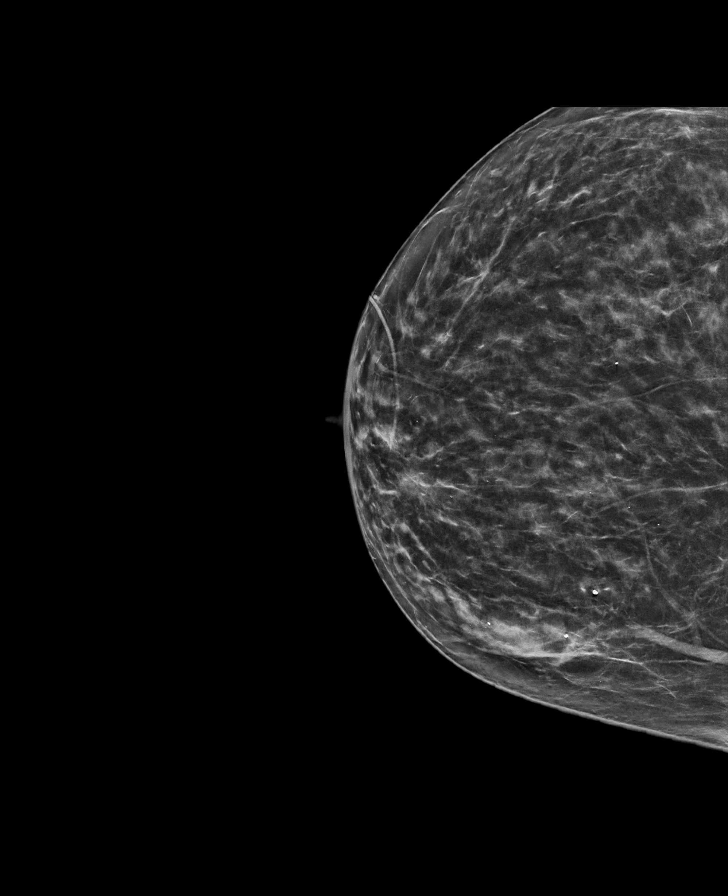

[L MLO tomo · tomo slice 39/76.0]
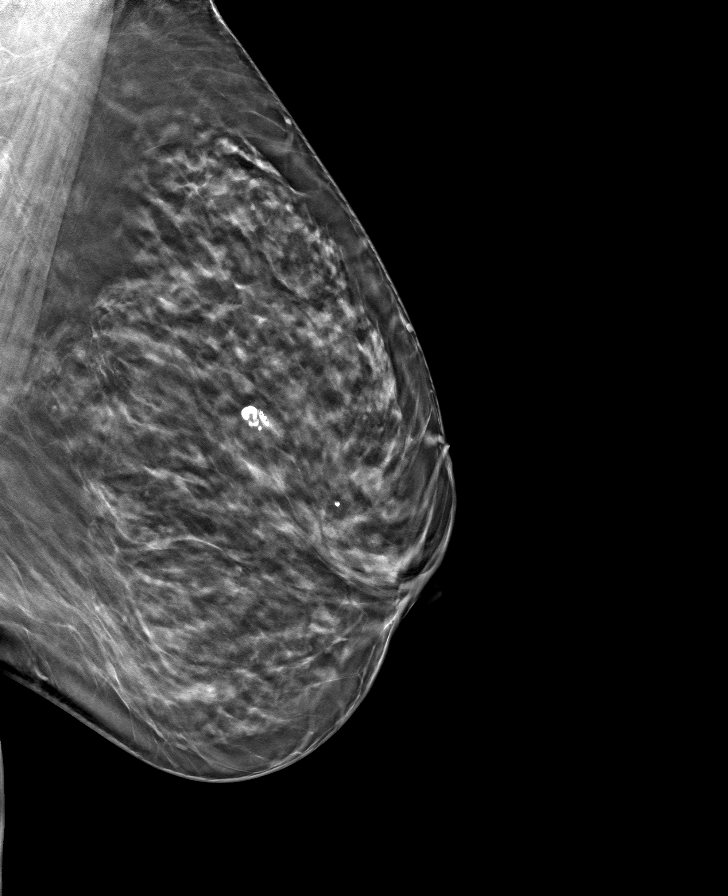

[R CC tomo · tomo slice 34/67.0]
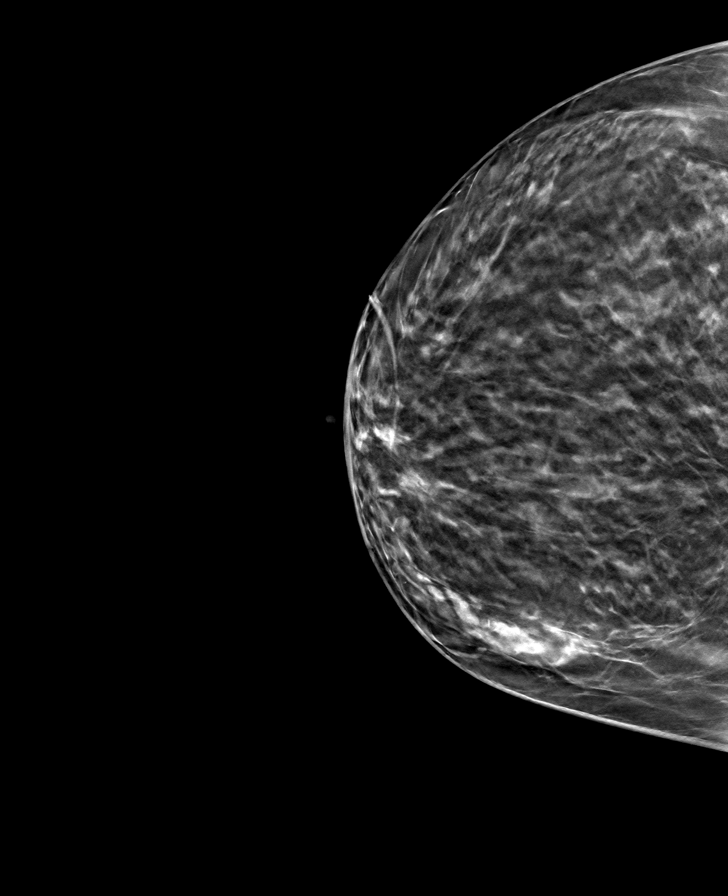

[L CC tomo · tomo slice 35/70.0]
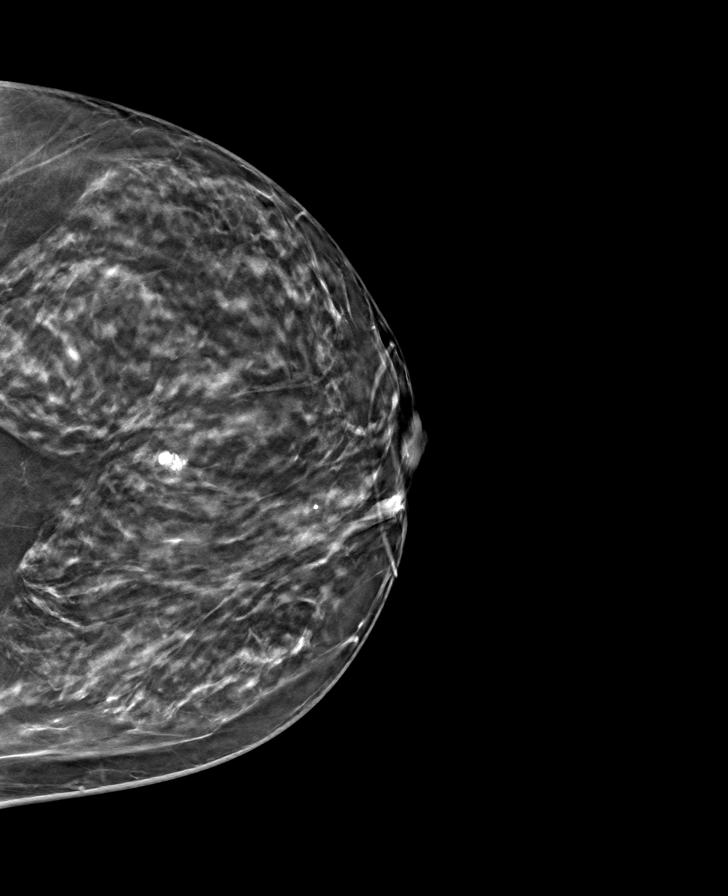

[R MLO tomo · tomo slice 38/75.0]
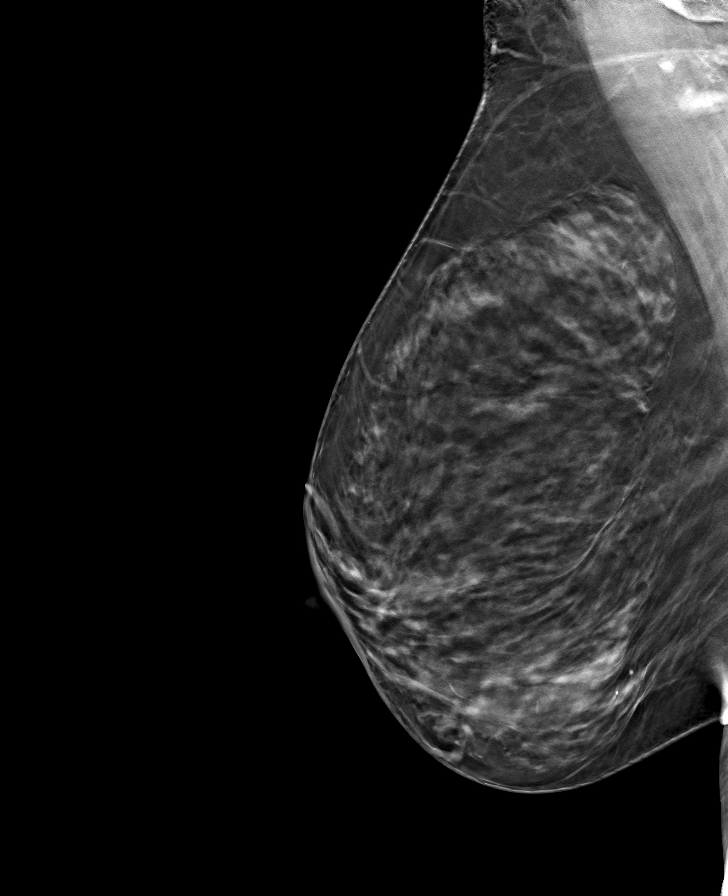

[8 of 24 positions shown; findings below may reference images not displayed]

ACR Breast Density Category c: The breast tissue is heterogeneously
dense, which may obscure small masses.
FINDINGS: There are no findings suspicious for malignancy. Images were
processed with CAD.
IMPRESSION: No mammographic evidence of malignancy. A result letter of this
screening mammogram will be mailed directly to the patient.

RECOMMENDATION:
Screening mammogram in one year. (Code:[id number]-[id number])

BI-RADS CATEGORY  1: Negative.

## 2021-09-06 NOTE — Telephone Encounter (Signed)
°  Follow up Call-  Call back number 09/04/2021  Post procedure Call Back phone  # [phone]  Permission to leave phone message Yes  Some recent data might be hidden     Patient questions:  Do you have a fever, pain , or abdominal swelling? No. Pain Score  0 *  Have you tolerated food without any problems? Yes.    Have you been able to return to your normal activities? Yes.    Do you have any questions about your discharge instructions: Diet   No. Medications  No. Follow up visit  No.  Do you have questions or concerns about your Care? No.  Actions: * If pain score is 4 or above: No action needed, pain <4.

## 2021-09-07 ENCOUNTER — Other Ambulatory Visit: Payer: Self-pay

## 2021-09-08 ENCOUNTER — Ambulatory Visit: Payer: Self-pay | Attending: Internal Medicine

## 2021-09-08 ENCOUNTER — Other Ambulatory Visit: Payer: Self-pay

## 2021-09-08 ENCOUNTER — Telehealth: Payer: Self-pay

## 2021-09-08 NOTE — Telephone Encounter (Signed)
Patient left VM stating she saw Beard on 08/28/21. Reports medications were sent to [facility], but they are not ready. Called pharmacy; no answer x 2.

## 2021-09-11 ENCOUNTER — Ambulatory Visit: Payer: Self-pay | Admitting: Pharmacist

## 2021-09-11 ENCOUNTER — Ambulatory Visit: Payer: Self-pay

## 2021-09-11 ENCOUNTER — Other Ambulatory Visit: Payer: Self-pay

## 2021-09-12 ENCOUNTER — Other Ambulatory Visit: Payer: Self-pay

## 2021-09-12 NOTE — Telephone Encounter (Signed)
[facility]. Staff member states that they are unable to fill OTC meds. Do not have Desitin in stock for over the counter purchase but can special order. Cannot confirm if hydrocortisone ointment is available, does have cream in stock. Called pt to review. Recommended pt call pharmacy if she would like to purchase these through [facility].  Patient reports concern regarding bill received on cell phone that was not clearly labeled. I explained that we do have [employer] approval letter on chart. Recommended pt call customer service number on letter or speak with [facility] counselor.

## 2021-09-14 NOTE — Progress Notes (Unsigned)
° °  S:     No chief complaint on file.   Shannon [name] is a 66 y.o. female who presents for hypertension evaluation, education, and management. PMH is significant for HTN, HLD. Patient was referred and last seen by Primary Care Provider, Dr. Wynetta Emery, on 08/07/21 by video visit. Reported medication adherence but was not checking BP at home. Referred to CPP for BP check. Of note, BP at gynecologist appt on 08/28/21 was 150/89.   Today, *** Denies dizziness, headache, blurred vision, swelling.  *** has labs ordered future - get today? Add ARB vs amlo? No compelling indications  Patient reports hypertension was diagnosed in ***.   Family/Social history:  -No cardiac FHx -Former smoker (quit 2017)  Medication adherence *** . Patient ***has taken BP medications today.   Current antihypertensives include:  HCTZ 25 mg daily  Antihypertensives tried in the past include: none  Reported home BP readings: ***  Patient reported dietary habits: Eats *** meals/day Breakfast: *** Lunch: *** Dinner: *** Snacks: *** Drinks: ***  Patient-reported exercise habits: ***  ASCVD risk factors include: HTN, HLD  O:   Last 3 Office BP readings: BP Readings from Last 3 Encounters:  09/04/21 114/69  08/28/21 (!) 150/89  04/03/21 137/86    BMET    Component Value Date/Time   NA 145 (H) 02/01/2020 1630   K 3.6 02/01/2020 1630   CL 103 02/01/2020 1630   CO2 28 02/01/2020 1630   GLUCOSE 105 (H) 02/01/2020 1630   GLUCOSE 87 10/12/2015 0931   BUN 11 02/01/2020 1630   CREATININE 0.81 02/01/2020 1630   CREATININE 0.69 10/12/2015 0931   CALCIUM 9.6 02/01/2020 1630   GFRNONAA 78 02/01/2020 1630   GFRNONAA >89 10/11/2014 1447   GFRAA 90 02/01/2020 1630   GFRAA >89 10/11/2014 1447    Renal function: CrCl cannot be calculated (Patient's most recent lab result is older than the maximum 21 days allowed.).  Clinical ASCVD: No  The 10-year ASCVD risk score (Arnett DK, et al., 2019) is: 5.9%    Values used to calculate the score:     Age: 10 years     Sex: Female     Is Non-Hispanic African American: No     Diabetic: No     Tobacco smoker: No     Systolic Blood Pressure: 818 mmHg     Is BP treated: Yes     HDL Cholesterol: 51 mg/dL     Total Cholesterol: 221 mg/dL   A/P: Hypertension diagnosed *** currently *** on current medications. BP goal < 130/80 mmHg. Medication adherence appears ***. Control is suboptimal due to ***.  -{Meds adjust:18428} ***.  -Patient educated on purpose, proper use, and potential adverse effects of ***.  -F/u labs ordered - *** -Counseled on lifestyle modifications for blood pressure control including reduced dietary sodium, increased exercise, adequate sleep. -Encouraged patient to check BP at home and bring log of readings to next visit. Counseled on proper use of home BP cuff.   Results reviewed and written information provided. Patient verbalized understanding of treatment plan. Total time in face-to-face counseling *** minutes.   F/u clinic visit in ***.

## 2021-09-18 ENCOUNTER — Ambulatory Visit: Payer: Self-pay | Admitting: Pharmacist

## 2021-09-18 ENCOUNTER — Other Ambulatory Visit: Payer: Self-pay

## 2021-11-27 ENCOUNTER — Other Ambulatory Visit: Payer: Self-pay

## 2021-12-04 ENCOUNTER — Ambulatory Visit: Payer: Self-pay

## 2021-12-04 ENCOUNTER — Ambulatory Visit: Payer: Self-pay | Attending: Internal Medicine | Admitting: Internal Medicine

## 2021-12-04 VITALS — BP 127/80 | Temp 98.3°F | Resp 18 | Wt 160.0 lb

## 2021-12-04 DIAGNOSIS — E663 Overweight: Secondary | ICD-10-CM

## 2021-12-04 DIAGNOSIS — I10 Essential (primary) hypertension: Secondary | ICD-10-CM

## 2021-12-04 NOTE — Progress Notes (Signed)
? ? ?Patient ID: Shannon [name], female    DOB: April 10, 1956  MRN: [id number] ? ?CC: Hypertension ? ? ?Subjective: ?Shannon [name] is a 66 y.o. female who presents for chronic disease management. ?Her concerns today include:  ?Pt with hx of HTN, former smoker, HL, IBS (on Celexa for this with overlay of anxiety), GERD, seasonal allergies. ? ?HTN: compliant with HCTZ.  Limits salt in the foods.  Checks BP occasionally and readings have been okay.  ?No CP ?Wgh up 5 lbs from February ?Eating fresh fruits and veggies daily.  Eating less beef, more chicken.  Drinks 1 soda a day and green tea (no sugar).   ? ?Approved for OC. Having problems getting approved for her [facility].  She has completed package with her today.  ? ?HM: had c-scope since last visit. C-scope revealed diverticulosis.  Some nausea post c-scope.  Drinking green tea helped ? ?Patient Active Problem List  ? Diagnosis Date Noted  ? History of genital warts 04/04/2021  ? Mixed hyperlipidemia 06/29/2019  ? Screening breast examination 01/27/2019  ? Seasonal allergies 11/11/2017  ? Plantar fascial fibromatosis of both feet 05/20/2017  ? Irritable bowel syndrome without diarrhea 03/11/2017  ? Gastroesophageal reflux disease without esophagitis 03/11/2017  ? Postmenopausal postcoital bleeding 08/27/2016  ? HTN (hypertension), benign 04/02/2016  ? Vitamin D deficiency 10/10/2015  ? Chronic knee pain 10/11/2014  ? Eczema 10/11/2014  ? History of right knee surgery 10/11/2014  ?  ? ?Current Outpatient Medications on File Prior to Visit  ?Medication Sig Dispense Refill  ? Ascorbic Acid (VITAMIN C) 1000 MG tablet Take 1,000 mg by mouth once a week.    ? fluticasone (FLONASE) 50 MCG/ACT nasal spray PLACE 2 SPRAYS INTO BOTH NOSTRILS EVERY DAY 16 g 2  ? hydrochlorothiazide (HYDRODIURIL) 25 MG tablet TAKE 1 TABLET (25 MG TOTAL) BY MOUTH DAILY. 90 tablet 3  ? hydrocortisone 1 % ointment Apply 1 application topically 2 (two) times daily. 30 g 0  ? liver oil-zinc oxide  (DESITIN) 40 % ointment Apply 1 application topically as needed for irritation. 56.7 g 0  ? Omega-3 Fatty Acids (FISH OIL PO) Take 1 tablet by mouth daily.    ? ?No current facility-administered medications on file prior to visit.  ? ? ?No Known Allergies ? ?Social History  ? ?Socioeconomic History  ? Marital status: Single  ?  Spouse name: Not on file  ? Number of children: 2  ? Years of education: Not on file  ? Highest education level: Some college, no degree  ?Occupational History  ? Not on file  ?Tobacco Use  ? Smoking status: Former  ?  Packs/day: 0.25  ?  Years: 20.00  ?  Pack years: 5.00  ?  Types: Cigarettes  ?  Quit date: 08/01/2015  ?  Years since quitting: 6.3  ? Smokeless tobacco: Never  ?Vaping Use  ? Vaping Use: Never used  ?Substance and Sexual Activity  ? Alcohol use: Not Currently  ?  Alcohol/week: 2.0 - 3.0 standard drinks  ?  Types: 2 - 3 Cans of beer per week  ?  Comment: 2-3 drinks beer weekend  ? Drug use: No  ? Sexual activity: Not Currently  ?  Birth control/protection: Surgical  ?Other Topics Concern  ? Not on file  ?Social History Narrative  ? Not on file  ? ?Social Determinants of Health  ? ?Financial Resource Strain: Not on file  ?Food Insecurity: Not on file  ?Transportation Needs: Not  on file  ?Physical Activity: Not on file  ?Stress: Not on file  ?Social Connections: Not on file  ?Intimate Partner Violence: Not on file  ? ? ?Family History  ?Problem Relation Age of Onset  ? Colon cancer Father 64  ? Cancer Father   ? Cancer Maternal Grandmother   ? Breast cancer Cousin   ? ? ?Past Surgical History:  ?Procedure Laterality Date  ? ABDOMINAL HYSTERECTOMY    ? ANTERIOR CRUCIATE LIGAMENT REPAIR  1995  ? BREAST EXCISIONAL BIOPSY Bilateral   ? benign  ? CESAREAN SECTION    ? x2  ? COLONOSCOPY  09/05/2015  ? Dr.Jacobs  ? COLONOSCOPY  05/2010  ? PARTIAL HYSTERECTOMY    ? TAH  ? POLYPECTOMY    ? ? ?ROS: ?Review of Systems ?Negative except as stated above ? ?PHYSICAL EXAM: ?BP 127/80 (BP Location:  Left Arm, Patient Position: Sitting, Cuff Size: Normal)   Temp 98.3 ?F (36.8 ?C)   Resp 18   Wt 160 lb (72.6 kg)   SpO2 97%   BMI 26.63 kg/m?   ?Wt Readings from Last 3 Encounters:  ?12/04/21 160 lb (72.6 kg)  ?09/04/21 155 lb (70.3 kg)  ?08/28/21 162 lb (73.5 kg)  ? ? ?Physical Exam ? ?General appearance - alert, well appearing, and in no distress ?Mental status - normal mood, behavior, speech, dress, motor activity, and thought processes ?Neck - supple, no significant adenopathy ?Chest - clear to auscultation, no wheezes, rales or rhonchi, symmetric air entry ?Heart - normal rate, regular rhythm, normal S1, S2, no murmurs, rubs, clicks or gallops ?Extremities - peripheral pulses normal, no pedal edema, no clubbing or cyanosis ? ? ? ?  Latest Ref Rng & Units 02/01/2020  ?  4:30 PM 02/23/2019  ?  3:17 PM 11/11/2017  ?  2:57 PM  ?CMP  ?Glucose 65 - 99 mg/dL 105   85   97    ?BUN 8 - 27 mg/dL '11   11   13    '$ ?Creatinine 0.57 - 1.00 mg/dL 0.81   0.71   0.69    ?Sodium 134 - 144 mmol/L 145   142   142    ?Potassium 3.5 - 5.2 mmol/L 3.6   3.8   3.5    ?Chloride 96 - 106 mmol/L 103   98   99    ?CO2 20 - 29 mmol/L '28   27   27    '$ ?Calcium 8.7 - 10.3 mg/dL 9.6   9.8   9.7    ?Total Protein 6.0 - 8.5 g/dL 7.1   7.0   7.2    ?Total Bilirubin 0.0 - 1.2 mg/dL 0.4   0.4   0.3    ?Alkaline Phos 48 - 121 IU/L 80   89   96    ?AST 0 - 40 IU/L '24   19   18    '$ ?ALT 0 - 32 IU/L '28   18   24    '$ ? ?Lipid Panel  ?   ?Component Value Date/Time  ? CHOL 221 (H) 02/01/2020 1630  ? TRIG 139 02/01/2020 1630  ? HDL 51 02/01/2020 1630  ? CHOLHDL 4.3 02/01/2020 1630  ? CHOLHDL 3.7 Ratio 09/10/2008 2037  ? VLDL 14 09/10/2008 2037  ? LDLCALC 145 (H) 02/01/2020 1630  ? ? ?CBC ?   ?Component Value Date/Time  ? WBC 7.1 02/01/2020 1630  ? WBC 4.9 10/12/2015 0931  ? RBC 4.89 02/01/2020 1630  ?  RBC 4.67 10/12/2015 0931  ? HGB 14.3 02/01/2020 1630  ? HCT 42.5 02/01/2020 1630  ? PLT 220 02/01/2020 1630  ? MCV 87 02/01/2020 1630  ? MCH 29.2 02/01/2020  1630  ? MCH 28.5 10/12/2015 0931  ? MCHC 33.6 02/01/2020 1630  ? MCHC 33.8 10/12/2015 0931  ? RDW 12.9 02/01/2020 1630  ? LYMPHSABS 1.9 10/12/2015 0931  ? MONOABS 0.6 10/12/2015 0931  ? EOSABS 0.1 10/12/2015 0931  ? BASOSABS 0.0 10/12/2015 0931  ? ? ?ASSESSMENT AND PLAN: ?1. Essential hypertension ?At goal.  Continue HCTZ. ?- CBC ?- Lipid panel ?- Comprehensive metabolic panel ? ?2. Overweight (BMI 25.0-29.9) ?Encouraged her to continue healthy eating habits. ?Encouraged her to move as much as she can. ? ? ?Patient was given the opportunity to ask questions.  Patient verbalized understanding of the plan and was able to repeat key elements of the plan.  ? ?This documentation was completed using Radio producer.  Any transcriptional errors are unintentional. ? ?Orders Placed This Encounter  ?Procedures  ? CBC  ? Lipid panel  ? Comprehensive metabolic panel  ? ? ? ?Requested Prescriptions  ? ? No prescriptions requested or ordered in this encounter  ? ? ?Return in about 4 months (around 04/06/2022). ? ?Karle Plumber, MD, FACP ? ?

## 2021-12-04 NOTE — Progress Notes (Signed)
Pt presents for hypertension follow-up  ?

## 2021-12-05 LAB — COMPREHENSIVE METABOLIC PANEL
ALT: 18 IU/L (ref 0–32)
AST: 17 IU/L (ref 0–40)
Albumin/Globulin Ratio: 1.8 (ref 1.2–2.2)
Albumin: 4.4 g/dL (ref 3.8–4.8)
Alkaline Phosphatase: 77 IU/L (ref 44–121)
BUN/Creatinine Ratio: 12 (ref 12–28)
BUN: 9 mg/dL (ref 8–27)
Bilirubin Total: 0.3 mg/dL (ref 0.0–1.2)
CO2: 26 mmol/L (ref 20–29)
Calcium: 9.8 mg/dL (ref 8.7–10.3)
Chloride: 104 mmol/L (ref 96–106)
Creatinine, Ser: 0.74 mg/dL (ref 0.57–1.00)
Globulin, Total: 2.5 g/dL (ref 1.5–4.5)
Glucose: 80 mg/dL (ref 70–99)
Potassium: 4.1 mmol/L (ref 3.5–5.2)
Sodium: 144 mmol/L (ref 134–144)
Total Protein: 6.9 g/dL (ref 6.0–8.5)
eGFR: 90 mL/min/{1.73_m2} (ref >59)

## 2021-12-05 LAB — CBC
Hematocrit: 40.4 % (ref 34.0–46.6)
Hemoglobin: 13.7 g/dL (ref 11.1–15.9)
MCH: 29.1 pg (ref 26.6–33.0)
MCHC: 33.9 g/dL (ref 31.5–35.7)
MCV: 86 fL (ref 79–97)
Platelets: 213 10*3/uL (ref 150–450)
RBC: 4.71 x10E6/uL (ref 3.77–5.28)
RDW: 12.7 % (ref 11.7–15.4)
WBC: 6.6 10*3/uL (ref 3.4–10.8)

## 2021-12-05 LAB — LIPID PANEL
Chol/HDL Ratio: 4.1 ratio (ref 0.0–4.4)
Cholesterol, Total: 222 mg/dL — ABNORMAL HIGH (ref 100–199)
HDL: 54 mg/dL (ref >39)
LDL Chol Calc (NIH): 154 mg/dL — ABNORMAL HIGH (ref 0–99)
Triglycerides: 80 mg/dL (ref 0–149)
VLDL Cholesterol Cal: 14 mg/dL (ref 5–40)

## 2021-12-07 ENCOUNTER — Telehealth: Payer: Self-pay

## 2021-12-07 ENCOUNTER — Other Ambulatory Visit: Payer: Self-pay

## 2021-12-07 MED ORDER — ATORVASTATIN CALCIUM 10 MG PO TABS
10.0000 mg | ORAL_TABLET | Freq: Every day | ORAL | 1 refills | Status: DC
  Filled 2021-12-07: qty 90, 90d supply, fill #0

## 2021-12-07 NOTE — Telephone Encounter (Signed)
Contacted pt to go over lab results pt is aware. Pt states she will try the medication for cholesterol

## 2021-12-07 NOTE — Addendum Note (Signed)
Addended by: Karle Plumber B on: 12/07/2021 12:47 PM   Modules accepted: Orders

## 2021-12-11 ENCOUNTER — Other Ambulatory Visit: Payer: Self-pay

## 2022-01-03 ENCOUNTER — Other Ambulatory Visit: Payer: Self-pay | Admitting: Internal Medicine

## 2022-01-03 DIAGNOSIS — Z1231 Encounter for screening mammogram for malignant neoplasm of breast: Secondary | ICD-10-CM

## 2022-01-04 ENCOUNTER — Ambulatory Visit
Admission: RE | Admit: 2022-01-04 | Discharge: 2022-01-04 | Disposition: A | Discharge status: Home / Self Care | Payer: No Typology Code available for payment source | Source: Ambulatory Visit | Attending: Internal Medicine | Admitting: Internal Medicine

## 2022-01-04 DIAGNOSIS — Z1231 Encounter for screening mammogram for malignant neoplasm of breast: Secondary | ICD-10-CM

## 2022-02-26 ENCOUNTER — Other Ambulatory Visit: Payer: Self-pay

## 2022-04-16 ENCOUNTER — Ambulatory Visit: Payer: Self-pay | Attending: Internal Medicine | Admitting: Internal Medicine

## 2022-04-16 ENCOUNTER — Encounter: Payer: Self-pay | Admitting: Internal Medicine

## 2022-04-16 ENCOUNTER — Other Ambulatory Visit: Payer: Self-pay

## 2022-04-16 VITALS — BP 129/62 | HR 59 | Temp 98.7°F | Ht 65.0 in | Wt 163.8 lb

## 2022-04-16 DIAGNOSIS — E663 Overweight: Secondary | ICD-10-CM

## 2022-04-16 DIAGNOSIS — E782 Mixed hyperlipidemia: Secondary | ICD-10-CM

## 2022-04-16 DIAGNOSIS — L299 Pruritus, unspecified: Secondary | ICD-10-CM

## 2022-04-16 DIAGNOSIS — M542 Cervicalgia: Secondary | ICD-10-CM

## 2022-04-16 DIAGNOSIS — H6121 Impacted cerumen, right ear: Secondary | ICD-10-CM

## 2022-04-16 DIAGNOSIS — G8929 Other chronic pain: Secondary | ICD-10-CM

## 2022-04-16 DIAGNOSIS — I10 Essential (primary) hypertension: Secondary | ICD-10-CM

## 2022-04-16 DIAGNOSIS — Z2821 Immunization not carried out because of patient refusal: Secondary | ICD-10-CM

## 2022-04-16 MED ORDER — FLUCONAZOLE 150 MG PO TABS
150.0000 mg | ORAL_TABLET | Freq: Every day | ORAL | 0 refills | Status: DC
  Filled 2022-04-16: qty 5, 5d supply, fill #0

## 2022-04-16 MED ORDER — DICLOFENAC SODIUM 1 % EX GEL
2.0000 g | Freq: Two times a day (BID) | CUTANEOUS | 1 refills | Status: DC | PRN
  Filled 2022-04-16: qty 100, 25d supply, fill #0

## 2022-04-16 NOTE — Patient Instructions (Addendum)
Purchase earwax softener drops over-the-counter and use it in the right ear for several days. Use the hydrocortisone cream in the ear 2-3 times a week to help decrease itching. I have prescribed an antifungal oral medication that is for short-term use called Diflucan.  This would help if the itching in the ear is due to fungal infection.  High Cholesterol  High cholesterol is a condition in which the blood has high levels of a white, waxy substance similar to fat (cholesterol). The liver makes all the cholesterol that the body needs. The human body needs small amounts of cholesterol to help build cells. A person gets extra or excess cholesterol from the food that he or she eats. The blood carries cholesterol from the liver to the rest of the body. If you have high cholesterol, deposits (plaques) may build up on the walls of your arteries. Arteries are the blood vessels that carry blood away from your heart. These plaques make the arteries narrow and stiff. Cholesterol plaques increase your risk for heart attack and stroke. Work with your health care provider to keep your cholesterol levels in a healthy range. What increases the risk? The following factors may make you more likely to develop this condition: Eating foods that are high in animal fat (saturated fat) or cholesterol. Being overweight. Not getting enough exercise. A family history of high cholesterol (familial hypercholesterolemia). Use of tobacco products. Having diabetes. What are the signs or symptoms? In most cases, high cholesterol does not usually cause any symptoms. In severe cases, very high cholesterol levels can cause: Fatty bumps under the skin (xanthomas). A white or gray ring around the black center (pupil) of the eye. How is this diagnosed? This condition may be diagnosed based on the results of a blood test. If you are older than 65 years of age, your health care provider may check your cholesterol levels every 4-6  years. You may be checked more often if you have high cholesterol or other risk factors for heart disease. The blood test for cholesterol measures: "Bad" cholesterol, or LDL cholesterol. This is the main type of cholesterol that causes heart disease. The desired level is less than 100 mg/dL (2.59 mmol/L). "Good" cholesterol, or HDL cholesterol. HDL helps protect against heart disease by cleaning the arteries and carrying the LDL to the liver for processing. The desired level for HDL is 60 mg/dL (1.55 mmol/L) or higher. Triglycerides. These are fats that your body can store or burn for energy. The desired level is less than 150 mg/dL (1.69 mmol/L). Total cholesterol. This measures the total amount of cholesterol in your blood and includes LDL, HDL, and triglycerides. The desired level is less than 200 mg/dL (5.17 mmol/L). How is this treated? Treatment for high cholesterol starts with lifestyle changes, such as diet and exercise. Diet changes. You may be asked to eat foods that have more fiber and less saturated fats or added sugar. Lifestyle changes. These may include regular exercise, maintaining a healthy weight, and quitting use of tobacco products. Medicines. These are given when diet and lifestyle changes have not worked. You may be prescribed a statin medicine to help lower your cholesterol levels. Follow these instructions at home: Eating and drinking  Eat a healthy, balanced diet. This diet includes: Daily servings of a variety of fresh, frozen, or canned fruits and vegetables. Daily servings of whole grain foods that are rich in fiber. Foods that are low in saturated fats and trans fats. These include poultry and fish without  skin, lean cuts of meat, and low-fat dairy products. A variety of fish, especially oily fish that contain omega-3 fatty acids. Aim to eat fish at least 2 times a week. Avoid foods and drinks that have added sugar. Use healthy cooking methods, such as roasting,  grilling, broiling, baking, poaching, steaming, and stir-frying. Do not fry your food except for stir-frying. If you drink alcohol: Limit how much you have to: 0-1 drink a day for women who are not pregnant. 0-2 drinks a day for men. Know how much alcohol is in a drink. In the [place] one drink equals one 12 oz bottle of beer (355 mL), one 5 oz glass of wine (148 mL), or one 1 oz glass of hard liquor (44 mL). Lifestyle  Get regular exercise. Aim to exercise for a total of 150 minutes a week. Increase your activity level by doing activities such as gardening, walking, and taking the stairs. Do not use any products that contain nicotine or tobacco. These products include cigarettes, chewing tobacco, and vaping devices, such as e-cigarettes. If you need help quitting, ask your health care provider. General instructions Take over-the-counter and prescription medicines only as told by your health care provider. Keep all follow-up visits. This is important. Where to find more information American Heart Association: www.heart.org National Heart, Lung, and Blood Institute: https://wilson-eaton.com/ Contact a health care provider if: You have trouble achieving or maintaining a healthy diet or weight. You are starting an exercise program. You are unable to stop smoking. Get help right away if: You have chest pain. You have trouble breathing. You have discomfort or pain in your jaw, neck, back, shoulder, or arm. You have any symptoms of a stroke. "BE FAST" is an easy way to remember the main warning signs of a stroke: B - Balance. Signs are dizziness, sudden trouble walking, or loss of balance. E - Eyes. Signs are trouble seeing or a sudden change in vision. F - Face. Signs are sudden weakness or numbness of the face, or the face or eyelid drooping on one side. A - Arms. Signs are weakness or numbness in an arm. This happens suddenly and usually on one side of the body. S - Speech. Signs are sudden trouble  speaking, slurred speech, or trouble understanding what people say. T - Time. Time to call emergency services. Write down what time symptoms started. You have other signs of a stroke, such as: A sudden, severe headache with no known cause. Nausea or vomiting. Seizure. These symptoms may represent a serious problem that is an emergency. Do not wait to see if the symptoms will go away. Get medical help right away. Call your local emergency services (911 in the [place] Do not drive yourself to the hospital. Summary Cholesterol plaques increase your risk for heart attack and stroke. Work with your health care provider to keep your cholesterol levels in a healthy range. Eat a healthy, balanced diet, get regular exercise, and maintain a healthy weight. Do not use any products that contain nicotine or tobacco. These products include cigarettes, chewing tobacco, and vaping devices, such as e-cigarettes. Get help right away if you have any symptoms of a stroke. This information is not intended to replace advice given to you by your health care provider. Make sure you discuss any questions you have with your health care provider. Document Revised: 09/22/2020 Document Reviewed: 09/12/2020 Elsevier Patient Education  [place].

## 2022-04-16 NOTE — Progress Notes (Signed)
Patient ID: Shannon [name], female    DOB: 08-15-1955  MRN: [id number]  CC: Hypertension   Subjective: Shannon [name] is a 66 y.o. female who presents for chronic ds management Her concerns today include:  Pt with hx of HTN, former smoker, HL, IBS (on Celexa for this with overlay of anxiety), GERD, seasonal allergies.  HM: Declines flu, Shingles and COVID vaccines  HTN:  compliant with HCTZ and salt restriction  HL:  LDL was 154 with ASCVD score of 7.1%.  She was agreeable to trying Lipitor 10 mg.  Took it for 1 wk then stopped because it upset her stomach  Still gets itching in ears. Saw ENT, Dr. Redmond Baseman, a few years ago for this.  Diagnosed with fungal otitis externa.  Miconazole cream was used and patient prescribed Acetasol HC drops to use for 2 weeks then as needed.  However she was unable to afford the drops.  She has tried using over-the-counter steroid cream and allergy pills without improvement in symptoms.  BMI 27:  wgh up 8 lbs since 08/2021.  Walks 3x/wk for 30 mins.  Eats good dinner, light breakfast then just fruits during the day.    Request RF on Diclofenac Gel which was prescribed in past for neck pain.  She finds it very helpful for chronic neck pain. Patient Active Problem List   Diagnosis Date Noted   History of genital warts 04/04/2021   Mixed hyperlipidemia 06/29/2019   Screening breast examination 01/27/2019   Seasonal allergies 11/11/2017   Plantar fascial fibromatosis of both feet 05/20/2017   Irritable bowel syndrome without diarrhea 03/11/2017   Gastroesophageal reflux disease without esophagitis 03/11/2017   Postmenopausal postcoital bleeding 08/27/2016   HTN (hypertension), benign 04/02/2016   Vitamin D deficiency 10/10/2015   Chronic knee pain 10/11/2014   Eczema 10/11/2014   History of right knee surgery 10/11/2014     Current Outpatient Medications on File Prior to Visit  Medication Sig Dispense Refill   Ascorbic Acid (VITAMIN C) 1000 MG  tablet Take 1,000 mg by mouth once a week.     atorvastatin (LIPITOR) 10 MG tablet Take 1 tablet (10 mg total) by mouth daily. 90 tablet 1   fluticasone (FLONASE) 50 MCG/ACT nasal spray PLACE 2 SPRAYS INTO BOTH NOSTRILS EVERY DAY 16 g 2   hydrochlorothiazide (HYDRODIURIL) 25 MG tablet TAKE 1 TABLET (25 MG TOTAL) BY MOUTH DAILY. 90 tablet 3   hydrocortisone 1 % ointment Apply 1 application topically 2 (two) times daily. 30 g 0   liver oil-zinc oxide (DESITIN) 40 % ointment Apply 1 application topically as needed for irritation. 56.7 g 0   Omega-3 Fatty Acids (FISH OIL PO) Take 1 tablet by mouth daily.     No current facility-administered medications on file prior to visit.    No Known Allergies  Social History   Socioeconomic History   Marital status: Single    Spouse name: Not on file   Number of children: 2   Years of education: Not on file   Highest education level: Some college, no degree  Occupational History   Not on file  Tobacco Use   Smoking status: Former    Packs/day: 0.25    Years: 20.00    Total pack years: 5.00    Types: Cigarettes    Quit date: 08/01/2015    Years since quitting: 6.7   Smokeless tobacco: Never  Vaping Use   Vaping Use: Never used  Substance and Sexual Activity  Alcohol use: Not Currently    Alcohol/week: 2.0 - 3.0 standard drinks of alcohol    Types: 2 - 3 Cans of beer per week    Comment: 2-3 drinks beer weekend   Drug use: No   Sexual activity: Not Currently    Birth control/protection: Surgical  Other Topics Concern   Not on file  Social History Narrative   Not on file   Social Determinants of Health   Financial Resource Strain: Not on file  Food Insecurity: No Food Insecurity (08/15/2020)   Hunger Vital Sign    Worried About Running Out of Food in the Last Year: Never true    Ran Out of Food in the Last Year: Never true  Transportation Needs: No Transportation Needs (08/15/2020)   PRAPARE - Radiographer, therapeutic (Medical): No    Lack of Transportation (Non-Medical): No  Physical Activity: Inactive (08/29/2017)   Exercise Vital Sign    Days of Exercise per Week: 0 days    Minutes of Exercise per Session: 0 min  Stress: No Stress Concern Present (08/29/2017)   [place]    Feeling of Stress : Only a little  Social Connections: Moderately Isolated (08/29/2017)   Social Connection and Isolation Panel [NHANES]    Frequency of Communication with Friends and Family: More than three times a week    Frequency of Social Gatherings with Friends and Family: Once a week    Attends Religious Services: Never    Marine scientist or Organizations: No    Attends Archivist Meetings: Never    Marital Status: Never married  Intimate Partner Violence: Unknown (08/29/2017)   Humiliation, Afraid, Rape, and Kick questionnaire    Fear of Current or Ex-Partner: Patient refused    Emotionally Abused: Patient refused    Physically Abused: Patient refused    Sexually Abused: Patient refused    Family History  Problem Relation Age of Onset   Colon cancer Father 27   Cancer Father    Cancer Maternal Grandmother    Breast cancer Cousin     Past Surgical History:  Procedure Laterality Date   ABDOMINAL HYSTERECTOMY     ANTERIOR CRUCIATE LIGAMENT REPAIR  1995   BREAST EXCISIONAL BIOPSY Bilateral    benign   CESAREAN SECTION     x2   COLONOSCOPY  09/05/2015   Dr.Jacobs   COLONOSCOPY  05/2010   PARTIAL HYSTERECTOMY     TAH   POLYPECTOMY      ROS: Review of Systems Negative except as stated above  PHYSICAL EXAM: BP 129/62   Pulse (!) 59   Temp 98.7 F (37.1 C) (Oral)   Ht '5\' 5"'$  (1.651 m)   Wt 163 lb 12.8 oz (74.3 kg)   SpO2 96%   BMI 27.26 kg/m   Wt Readings from Last 3 Encounters:  04/16/22 163 lb 12.8 oz (74.3 kg)  12/04/21 160 lb (72.6 kg)  09/04/21 155 lb (70.3 kg)    Physical Exam  General  appearance - alert, well appearing, older Caucasian female and in no distress Mental status - normal mood, behavior, speech, dress, motor activity, and thought processes Ears -dry skin with slight erythema at the meatus of the left ear canal.  Dry skin with wax buildup in the right ear canal obscuring view of the entire canal and tympanic membrane. Chest - clear to auscultation, no wheezes, rales or rhonchi, symmetric air entry  Heart - normal rate, regular rhythm, normal S1, S2, no murmurs, rubs, clicks or gallops Musculoskeletal -neck is supple. Extremities - peripheral pulses normal, no pedal edema, no clubbing or cyanosis      Latest Ref Rng & Units 12/04/2021    2:27 PM 02/01/2020    4:30 PM 02/23/2019    3:17 PM  CMP  Glucose 70 - 99 mg/dL 80  105  85   BUN 8 - 27 mg/dL '9  11  11   '$ Creatinine 0.57 - 1.00 mg/dL 0.74  0.81  0.71   Sodium 134 - 144 mmol/L 144  145  142   Potassium 3.5 - 5.2 mmol/L 4.1  3.6  3.8   Chloride 96 - 106 mmol/L 104  103  98   CO2 20 - 29 mmol/L '26  28  27   '$ Calcium 8.7 - 10.3 mg/dL 9.8  9.6  9.8   Total Protein 6.0 - 8.5 g/dL 6.9  7.1  7.0   Total Bilirubin 0.0 - 1.2 mg/dL 0.3  0.4  0.4   Alkaline Phos 44 - 121 IU/L 77  80  89   AST 0 - 40 IU/L '17  24  19   '$ ALT 0 - 32 IU/L '18  28  18    '$ Lipid Panel     Component Value Date/Time   CHOL 222 (H) 12/04/2021 1427   TRIG 80 12/04/2021 1427   HDL 54 12/04/2021 1427   CHOLHDL 4.1 12/04/2021 1427   CHOLHDL 3.7 Ratio 09/10/2008 2037   VLDL 14 09/10/2008 2037   LDLCALC 154 (H) 12/04/2021 1427    CBC    Component Value Date/Time   WBC 6.6 12/04/2021 1427   WBC 4.9 10/12/2015 0931   RBC 4.71 12/04/2021 1427   RBC 4.67 10/12/2015 0931   HGB 13.7 12/04/2021 1427   HCT 40.4 12/04/2021 1427   PLT 213 12/04/2021 1427   MCV 86 12/04/2021 1427   MCH 29.1 12/04/2021 1427   MCH 28.5 10/12/2015 0931   MCHC 33.9 12/04/2021 1427   MCHC 33.8 10/12/2015 0931   RDW 12.7 12/04/2021 1427   LYMPHSABS 1.9 10/12/2015  0931   MONOABS 0.6 10/12/2015 0931   EOSABS 0.1 10/12/2015 0931   BASOSABS 0.0 10/12/2015 0931    ASSESSMENT AND PLAN:  1. Essential hypertension At goal.  Continue HCTZ.  2. Chronic neck pain - diclofenac Sodium (VOLTAREN) 1 % GEL; Apply 2 g topically 2 (two) times daily as needed.  Dispense: 100 g; Refill: 1  3. Overweight (BMI 25.0-29.9) Patient advised to eliminate sugary drinks from the diet, cut back on portion sizes especially of white carbohydrates, eat more white lean meat like chicken [place] and seafood instead of beef or pork and incorporate fresh fruits and vegetables into the diet daily. Encouraged her to move as much as she can.  4. Itching of ear We will try her with oral Diflucan for 5 days.  Advised to use hydrocortisone cream on a Q-tip at the meatus a few times a week. - fluconazole (DIFLUCAN) 150 MG tablet; Take 1 tablet (150 mg total) by mouth daily.  Dispense: 5 tablet; Refill: 0  5. Impacted cerumen of right ear Recommend use of over-the-counter wax softener for several days to the right ear.  6. Mixed hyperlipidemia Patient declines trying a different statin medication.  She will try to improve on her eating habits  7. Influenza vaccination declined Recommended.  Patient declined.    Patient was given the opportunity  to ask questions.  Patient verbalized understanding of the plan and was able to repeat key elements of the plan.   This documentation was completed using Radio producer.  Any transcriptional errors are unintentional.  No orders of the defined types were placed in this encounter.    Requested Prescriptions    No prescriptions requested or ordered in this encounter    No follow-ups on file.  Karle Plumber, MD, FACP

## 2022-05-28 ENCOUNTER — Other Ambulatory Visit: Payer: Self-pay

## 2022-07-05 ENCOUNTER — Other Ambulatory Visit: Payer: Self-pay

## 2022-07-06 ENCOUNTER — Other Ambulatory Visit: Payer: Self-pay

## 2022-07-09 ENCOUNTER — Other Ambulatory Visit: Payer: Self-pay

## 2022-08-20 ENCOUNTER — Other Ambulatory Visit: Payer: Self-pay

## 2022-08-20 ENCOUNTER — Ambulatory Visit: Payer: Medicare Other | Attending: Internal Medicine | Admitting: Internal Medicine

## 2022-08-20 ENCOUNTER — Ambulatory Visit
Admission: RE | Admit: 2022-08-20 | Discharge: 2022-08-20 | Disposition: A | Discharge status: Home / Self Care | Payer: Medicare Other | Source: Ambulatory Visit | Attending: Internal Medicine | Admitting: Internal Medicine

## 2022-08-20 ENCOUNTER — Encounter: Payer: Self-pay | Admitting: Internal Medicine

## 2022-08-20 VITALS — BP 143/83 | HR 64 | Temp 98.2°F | Ht 65.0 in | Wt 172.0 lb

## 2022-08-20 DIAGNOSIS — I10 Essential (primary) hypertension: Secondary | ICD-10-CM | POA: Diagnosis present

## 2022-08-20 DIAGNOSIS — H6123 Impacted cerumen, bilateral: Secondary | ICD-10-CM | POA: Insufficient documentation

## 2022-08-20 DIAGNOSIS — L299 Pruritus, unspecified: Secondary | ICD-10-CM | POA: Diagnosis not present

## 2022-08-20 DIAGNOSIS — G8929 Other chronic pain: Secondary | ICD-10-CM | POA: Insufficient documentation

## 2022-08-20 DIAGNOSIS — J302 Other seasonal allergic rhinitis: Secondary | ICD-10-CM

## 2022-08-20 DIAGNOSIS — M542 Cervicalgia: Secondary | ICD-10-CM

## 2022-08-20 DIAGNOSIS — M25561 Pain in right knee: Secondary | ICD-10-CM | POA: Insufficient documentation

## 2022-08-20 MED ORDER — FLUTICASONE PROPIONATE 50 MCG/ACT NA SUSP: NASAL | 2 refills | Status: DC

## 2022-08-20 MED ORDER — AMLODIPINE BESYLATE 5 MG PO TABS: 5.0000 mg | ORAL_TABLET | Freq: Every day | ORAL | 1 refills | Status: DC

## 2022-08-20 MED ORDER — AMLODIPINE BESYLATE 5 MG PO TABS
5.0000 mg | ORAL_TABLET | Freq: Every day | ORAL | 1 refills | Status: DC
  Filled 2022-08-20: qty 90, 90d supply, fill #0

## 2022-08-20 MED ORDER — FLUTICASONE PROPIONATE 50 MCG/ACT NA SUSP
2.0000 | Freq: Every day | NASAL | 2 refills | Status: DC
  Filled 2022-08-20: qty 16, 30d supply, fill #0

## 2022-08-20 MED ORDER — DICLOFENAC SODIUM 1 % EX GEL
2.0000 g | Freq: Two times a day (BID) | CUTANEOUS | 1 refills | Status: DC | PRN
  Filled 2022-08-20 – 2023-04-10 (×2): qty 100, 25d supply, fill #0

## 2022-08-20 MED ORDER — DICLOFENAC SODIUM 1 % EX GEL: 2.0000 g | Freq: Two times a day (BID) | CUTANEOUS | 1 refills | Status: DC | PRN

## 2022-08-20 MED ORDER — HYDROCORTISONE-ACETIC ACID 1-2 % OT SOLN: OTIC | 0 refills | Status: DC

## 2022-08-20 NOTE — Patient Instructions (Signed)
Your blood pressure is not at goal.  We have added another blood pressure medicine called amlodipine 5 mg daily.  Please stop at [facility] imaging downstairs to have x-rays done on the right knee and the neck.
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
## 2022-08-23 ENCOUNTER — Other Ambulatory Visit: Payer: Self-pay

## 2022-09-10 ENCOUNTER — Ambulatory Visit (INDEPENDENT_AMBULATORY_CARE_PROVIDER_SITE_OTHER): Payer: 59 | Admitting: Orthopedic Surgery

## 2022-09-10 ENCOUNTER — Encounter: Payer: Self-pay | Admitting: Orthopedic Surgery

## 2022-09-10 DIAGNOSIS — M1711 Unilateral primary osteoarthritis, right knee: Secondary | ICD-10-CM

## 2022-09-10 NOTE — Progress Notes (Signed)
Office Visit Note   Patient: Shannon [name]           Date of Birth: [birth year]           MRN: [id number] Visit Date: 09/10/2022 Requested by: Ladell Pier, MD [address],  [place] [id number] PCP: Ladell Pier, MD  Subjective: Chief Complaint  Patient presents with   Right Knee - Pain    HPI: Shannon [name] is a 67 y.o. female who presents to the office reporting right knee pain.  Reports chronic pain which is a relatively constant throb.  Had skiing accident years ago.  Underwent ACL reconstruction.  Taking black seed supplement over-the-counter.  Reports some occasional weakness and stiffness.  The snow skiing accident occurred during the time Dusty was in her 71s.  Denies any history of instability.  She does use a knee brace.  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                ROS: All systems reviewed are negative as they relate to the chief complaint within the history of present illness.  Patient denies fevers or chills.  Assessment & Plan: Visit Diagnoses: No diagnosis found.  Plan: Impression is mild medial compartment arthritis in the right knee with slight laxity on exam right compared to left.  No effusion.  She is feeling better on the black seed oil.  Would not recommend further intervention at this time unless she becomes more symptomatic.  Intervention then would consist of cortisone injection into the knee.  Continue to wear the brace with ambulation and work on nonweightbearing quad strengthening exercises.  Also advised her to avoid inflammatory foods such as refined carbohydrates and high fructose corn syrup type food additives.  She will follow-up as needed.  Follow-Up Instructions: No follow-ups on file.   Orders:  No orders of the defined types were placed in this encounter.  No orders of the defined types were placed in this encounter.     Procedures: No procedures  performed   Clinical Data: No additional findings.  Objective: Vital Signs: There were no vitals taken for this visit.  Physical Exam:  Constitutional: Patient appears well-developed HEENT:  Head: Normocephalic Eyes:EOM are normal Neck: Normal range of motion Cardiovascular: Normal rate Pulmonary/chest: Effort normal Neurologic: Patient is alert Skin: Skin is warm Psychiatric: Patient has normal mood and affect  Ortho Exam: Ortho exam demonstrates normal gait alignment.  Full range of motion of both knees.  No effusion in either knee.  She has about 3-1/2 mm laxity on the left knee to anterior drawer testing about 5-1/2 on the right but she does have an endpoint.  In general she does have increased laxity on the right knee but no pivot shift or glide.  No medial or lateral joint line tenderness.  Palpable pedal pulses with no groin pain on the right with internal/external rotation of the leg.  Specialty Comments:  No specialty comments available.  Imaging: No results found.   PMFS History: Patient Active Problem List   Diagnosis Date Noted   History of genital warts 04/04/2021   Mixed hyperlipidemia 06/29/2019   Screening breast examination 01/27/2019   Seasonal allergies 11/11/2017   Plantar fascial fibromatosis of both feet 05/20/2017   Irritable bowel syndrome without diarrhea 03/11/2017   Gastroesophageal reflux disease without esophagitis 03/11/2017   Postmenopausal postcoital bleeding 08/27/2016   HTN (hypertension), benign 04/02/2016  Vitamin D deficiency 10/10/2015   Chronic knee pain 10/11/2014   Eczema 10/11/2014   History of right knee surgery 10/11/2014   Past Medical History:  Diagnosis Date   Arthritis    BV (bacterial vaginosis)    Hypertension     Family History  Problem Relation Age of Onset   Colon cancer Father 63   Cancer Father    Cancer Maternal Grandmother    Breast cancer Cousin     Past Surgical History:  Procedure Laterality Date    ABDOMINAL HYSTERECTOMY     ANTERIOR CRUCIATE LIGAMENT REPAIR  1995   BREAST EXCISIONAL BIOPSY Bilateral    benign   CESAREAN SECTION     x2   COLONOSCOPY  09/05/2015   Dr.Jacobs   COLONOSCOPY  05/2010   PARTIAL HYSTERECTOMY     TAH   POLYPECTOMY     Social History   Occupational History   Not on file  Tobacco Use   Smoking status: Former    Packs/day: 0.25    Years: 20.00    Total pack years: 5.00    Types: Cigarettes    Quit date: 08/01/2015    Years since quitting: 7.1   Smokeless tobacco: Never  Vaping Use   Vaping Use: Never used  Substance and Sexual Activity   Alcohol use: Not Currently    Alcohol/week: 2.0 - 3.0 standard drinks of alcohol    Types: 2 - 3 Cans of beer per week    Comment: 2-3 drinks beer weekend   Drug use: No   Sexual activity: Not Currently    Birth control/protection: Surgical

## 2022-10-02 ENCOUNTER — Ambulatory Visit: Payer: Self-pay | Admitting: Internal Medicine

## 2022-10-08 ENCOUNTER — Encounter: Payer: Self-pay | Admitting: Internal Medicine

## 2022-10-08 ENCOUNTER — Ambulatory Visit: Payer: 59 | Attending: Internal Medicine | Admitting: Internal Medicine

## 2022-10-08 VITALS — BP 126/76 | HR 64 | Temp 98.0°F | Ht 65.0 in | Wt 172.2 lb

## 2022-10-08 DIAGNOSIS — Z0000 Encounter for general adult medical examination without abnormal findings: Secondary | ICD-10-CM | POA: Diagnosis not present

## 2022-10-08 DIAGNOSIS — Z780 Asymptomatic menopausal state: Secondary | ICD-10-CM | POA: Diagnosis not present

## 2022-10-08 DIAGNOSIS — Z7189 Other specified counseling: Secondary | ICD-10-CM | POA: Diagnosis not present

## 2022-10-08 DIAGNOSIS — E663 Overweight: Secondary | ICD-10-CM

## 2022-10-08 DIAGNOSIS — L309 Dermatitis, unspecified: Secondary | ICD-10-CM

## 2022-10-08 DIAGNOSIS — Z0100 Encounter for examination of eyes and vision without abnormal findings: Secondary | ICD-10-CM

## 2022-10-08 DIAGNOSIS — I10 Essential (primary) hypertension: Secondary | ICD-10-CM

## 2022-10-08 DIAGNOSIS — Z2821 Immunization not carried out because of patient refusal: Secondary | ICD-10-CM | POA: Diagnosis not present

## 2022-10-08 DIAGNOSIS — Z6828 Body mass index (BMI) 28.0-28.9, adult: Secondary | ICD-10-CM

## 2022-10-08 NOTE — Patient Instructions (Signed)
If you execute a healthcare power of attorney, please bring a copy for records.  I have referred you to the ophthalmologist on the dermatologist as discussed today.  Hold off on taking amlodipine unless your blood pressure starts to increase above 130/80.

## 2022-10-08 NOTE — Progress Notes (Unsigned)
Subjective:    Shannon [name] is a 67 y.o. female who presents for a Welcome to Medicare exam.  Pt with hx of HTN, former smoker, HL(Lipitor upset stomach; decline other statin), IBS (on Celexa for this with overlay of anxiety), GERD, seasonal allergies.  Review of Systems Skin: Reports intermittent itchy rash on her back.  Still bothered with itching in the ears as well.  Would like a referral to see dermatology. [facility]: Norvasc 5 mg daily was added to HCTZ on last visit.  She stopped taking the Norvasc 2 weeks ago because she has been taking some supplements for her joints.  When she went up on the supplement, it said that she should not take with amlodipine.  Medication that she has been taking for her joints are Cumin 500mg  that also has omega 6 and omega 9 in it.  Also takes turmeric 500 mg.  Reports good relief of joint pains.        Objective:    Today's Vitals   10/08/22 1517  BP: 126/76  Pulse: 64  Temp: 98 F (36.7 C)  TempSrc: Oral  SpO2: 97%  Weight: 172 lb 3.2 oz (78.1 kg)  Height: 5\' 5"  (1.651 m)  Body mass index is 28.66 kg/m.  Medications Outpatient Encounter Medications as of 10/08/2022  Medication Sig   acetic acid-hydrocortisone (VOSOL-HC) OTIC solution 4 drops into both ears every evening x 2 wks then PRN   amLODipine (NORVASC) 5 MG tablet Take 1 tablet (5 mg total) by mouth daily.   Ascorbic Acid (VITAMIN C) 1000 MG tablet Take 1,000 mg by mouth once a week.   Black Currant Seed Oil 500 MG CAPS Take 500 mg by mouth daily.   diclofenac Sodium (VOLTAREN) 1 % GEL Apply 2 grams topically 2 (two) times daily as needed.   fluticasone (FLONASE) 50 MCG/ACT nasal spray Place 2 sprays into both nostrils daily.   Omega-3 Fatty Acids (FISH OIL PO) Take 1 tablet by mouth daily.   Turmeric (QC TUMERIC COMPLEX) 500 MG CAPS Take 500 mg by mouth daily.   hydrochlorothiazide (HYDRODIURIL) 25 MG tablet TAKE 1 TABLET (25 MG TOTAL) BY MOUTH DAILY.   No facility-administered  encounter medications on file as of 10/08/2022.     History: Past Medical History:  Diagnosis Date   Arthritis    BV (bacterial vaginosis)    Hypertension    Past Surgical History:  Procedure Laterality Date   ABDOMINAL HYSTERECTOMY     ANTERIOR CRUCIATE LIGAMENT REPAIR  1995   BREAST EXCISIONAL BIOPSY Bilateral    benign   CESAREAN SECTION     x2   COLONOSCOPY  09/05/2015   Dr.Jacobs   COLONOSCOPY  05/2010   PARTIAL HYSTERECTOMY     TAH   POLYPECTOMY      Family History  Problem Relation Age of Onset   Colon cancer Father 58   Cancer Father    Cancer Maternal Grandmother    Breast cancer Cousin    Social History   Occupational History   Not on file  Tobacco Use   Smoking status: Former    Packs/day: 0.25    Years: 20.00    Additional pack years: 0.00    Total pack years: 5.00    Types: Cigarettes    Quit date: 08/01/2015    Years since quitting: 7.1   Smokeless tobacco: Never  Vaping Use   Vaping Use: Never used  Substance and Sexual Activity   Alcohol use: Not  Currently    Alcohol/week: 2.0 - 3.0 standard drinks of alcohol    Types: 2 - 3 Cans of beer per week    Comment: 2-3 drinks beer weekend   Drug use: No   Sexual activity: Not Currently    Birth control/protection: Surgical    Tobacco Counseling Non-smoker.  Quit 7-8 yrs ago.  She was 1/2 pk a day for 2 yrs.   Drinks on Saturday nights - 2 wine cooler   Immunizations and Health Maintenance Immunization History  Administered Date(s) Administered   PFIZER(Purple Top)SARS-COV-2 Vaccination 10/12/2019, 11/02/2019   Tdap 04/02/2016   Health Maintenance Due  Topic Date Due   COVID-19 Vaccine (3 - 2023-24 season) 03/23/2022   Pneumonia Vaccine 62+ Years old (1 of 1 - PCV) Never done   DEXA SCAN  Never done  Declines PCV Declines COVID booster  Activities of Daily Living    10/08/2022    3:00 PM  In your present state of health, do you have any difficulty performing the following  activities:  Hearing? 0  Vision? 0  Difficulty concentrating or making decisions? 0  Walking or climbing stairs? 0  Dressing or bathing? 0  Doing errands, shopping? 0  Preparing Food and eating ? N  Using the Toilet? N  In the past six months, have you accidently leaked urine? N  Do you have problems with loss of bowel control? N  Managing your Medications? N  Managing your Finances? N  Housekeeping or managing your Housekeeping? N    Physical Exam  (optional), or other factors deemed appropriate based on the beneficiary's medical and social history and current clinical standards. General: 75 Caucasian female in NAD Chest clear to auscultation bilaterally CVS: Regular rate and rhythm Skin: No rash seen at this time. Advanced Directives: Does Patient Have a Medical Advance Directive?: No Would patient like information on creating a medical advance directive?: Yes (MAU/Ambulatory/Procedural Areas - Information given)    Assessment:    This is a routine wellness examination for this patient .   Vision/Hearing screen Vision Screening   Right eye Left eye Both eyes  Without correction 30/30 20/30 20/25   With correction     Wears OTC reading glasses.   Whisper test normal.  Dietary issues and exercise activities discussed:   Better with eating habits.  Stopped eating a lot of junk foods. Cut down to 1 Dr. Malachi Bonds a day Plants to walk once a for 30 minutes.  Has a FIT bit that counts her steps and calories burn  Goals      Weight (lb) < 200 lb (90.7 kg)     Want to lose weight      Depression Screen    10/08/2022    3:08 PM 08/20/2022    1:48 PM 04/16/2022    1:40 PM 08/28/2021    4:14 PM  PHQ 2/9 Scores  PHQ - 2 Score 0 2 2 1   PHQ- 9 Score 2 7 6 3    Not a major issue  Fall Risk    10/08/2022    3:08 PM  [place] in the past year? 0  Number falls in past yr: 0  Injury with Fall? 0  Risk for fall due to : No Fall Risks    Cognitive Function:     10/08/2022    3:20 PM  MMSE - Mini Mental State Exam  Orientation to time 5  Orientation to Place 5  Registration 3  Attention/  Calculation 4  Recall 2  Language- name 2 objects 2  Language- repeat 1  Language- follow 3 step command 3  Language- read & follow direction 1  Write a sentence 1  Copy design 1  Total score 28        Patient Care Team: Ladell Pier, MD as PCP - General (Internal Medicine)     Plan:    1. Encounter for Medicare annual wellness exam   2. Advance directive discussed with patient Shannon [name] over with her what is an advanced directive.  Also discussed living will and healthcare power of attorney.  Patient given packet with forms in it should she decide to execute a living will or healthcare power of attorney.  I have encouraged her to do so.  Advised to bring a copy for our records if she execute a living will or healthcare power of attorney.  3. Postmenopausal estrogen deficiency Due for osteoporosis screening. - DG Bone Density; Future  4. Dermatitis - Ambulatory referral to Dermatology  5. Routine eye exam Recommend routine eye exam since she has not had 1 in several years. - Ambulatory referral to Ophthalmology  6. Essential hypertension Blood pressure at goal.  Since she stopped taking amlodipine 2 weeks ago and blood pressure is still good, we will have her hold off on taking it.  She will continue HCTZ.  7. Overweight (BMI 25.0-29.9) Commended her on changes that she is made in her eating habits so far.  Healthy eating habits discussed.  Encouraged her to continue walking daily.  8. Pneumococcal vaccination declined Patient declined pneumonia and shingles vaccines.   I have personally reviewed and noted the following in the patient's chart:   Medical and social history Use of alcohol, tobacco or illicit drugs  Current medications and supplements Functional ability and status Nutritional status Physical activity Advanced  directives List of other physicians Hospitalizations, surgeries, and ER visits in previous 12 months Vitals Screenings to include cognitive, depression, and falls Referrals and appointments  In addition, I have reviewed and discussed with patient certain preventive protocols, quality metrics, and best practice recommendations. A written personalized care plan for preventive services as well as general preventive health recommendations were provided to patient.     Karle Plumber, MD 10/08/2022

## 2022-10-15 ENCOUNTER — Other Ambulatory Visit: Payer: Self-pay | Admitting: Internal Medicine

## 2022-10-15 DIAGNOSIS — I10 Essential (primary) hypertension: Secondary | ICD-10-CM

## 2022-10-16 ENCOUNTER — Other Ambulatory Visit: Payer: Self-pay | Admitting: Pharmacist

## 2022-10-16 ENCOUNTER — Other Ambulatory Visit: Payer: Self-pay

## 2022-10-16 DIAGNOSIS — I10 Essential (primary) hypertension: Secondary | ICD-10-CM

## 2022-10-16 MED ORDER — HYDROCHLOROTHIAZIDE 25 MG PO TABS
25.0000 mg | ORAL_TABLET | Freq: Every day | ORAL | 3 refills | Status: DC
  Filled 2022-10-16: qty 90, 90d supply, fill #0
  Filled 2023-01-02 (×2): qty 90, 90d supply, fill #1
  Filled 2023-04-10 – 2023-05-14 (×2): qty 90, 90d supply, fill #2

## 2022-10-18 ENCOUNTER — Other Ambulatory Visit: Payer: Self-pay

## 2023-01-02 ENCOUNTER — Other Ambulatory Visit: Payer: Self-pay | Admitting: Nurse Practitioner

## 2023-01-02 ENCOUNTER — Other Ambulatory Visit: Payer: Self-pay

## 2023-01-02 ENCOUNTER — Other Ambulatory Visit: Payer: Self-pay | Admitting: Internal Medicine

## 2023-01-02 DIAGNOSIS — L299 Pruritus, unspecified: Secondary | ICD-10-CM

## 2023-01-02 MED ORDER — HYDROCORTISONE-ACETIC ACID 1-2 % OT SOLN
OTIC | 0 refills | Status: DC
  Filled 2023-01-02: qty 10, 25d supply, fill #0

## 2023-01-03 ENCOUNTER — Other Ambulatory Visit: Payer: Self-pay

## 2023-02-11 ENCOUNTER — Ambulatory Visit: Payer: 59 | Attending: Internal Medicine | Admitting: Internal Medicine

## 2023-02-11 ENCOUNTER — Encounter: Payer: Self-pay | Admitting: Internal Medicine

## 2023-02-11 VITALS — BP 140/72 | HR 63 | Temp 97.9°F | Ht 65.0 in | Wt 172.0 lb

## 2023-02-11 DIAGNOSIS — I10 Essential (primary) hypertension: Secondary | ICD-10-CM | POA: Diagnosis not present

## 2023-02-11 DIAGNOSIS — L299 Pruritus, unspecified: Secondary | ICD-10-CM | POA: Diagnosis not present

## 2023-02-11 MED ORDER — HYDROCORTISONE-ACETIC ACID 1-2 % OT SOLN: OTIC | 0 refills | Status: DC

## 2023-02-11 MED ORDER — VALSARTAN 40 MG PO TABS: 40.0000 mg | ORAL_TABLET | Freq: Every day | ORAL | 4 refills | Status: DC

## 2023-02-11 NOTE — Patient Instructions (Signed)
Your blood pressure is not at goal.  Continue hydrochlorothiazide 25 mg daily.  We have added another blood pressure medicine called valsartan 40 mg daily.  Continue to limit the salt in the foods.

## 2023-02-11 NOTE — Progress Notes (Signed)
Patient ID: Shannon [name], female    DOB: [birth year]  MRN: [id number]  CC: Hypertension (HTN f/u.  Med refill - hydrocortisone 1% and Acetic Acid 2%//)   Subjective: Shannon [name] is a 67 y.o. female who presents for chronic ds management Her concerns today include:  Pt with hx of HTN, former smoker, HL(Lipitor upset stomach; decline other statin), IBS (on Celexa for this with overlay of anxiety), GERD, seasonal allergies.   HTN:  on hydrochlorothiazide 25 mg and taking consistently.  No Black Seed Oil in 2 days Limits salt in foods No device to check No chest pains.  She stays active cleaning her house, mowing her lawn.  Request RF on [facility] ear drops.  Saw Dr. Terri Piedra, derm, about 1 mth ago.  Reports she gave her a cream to use PRN  which helps to calm  down the itching as well.  Also using an Oatmeal lotion OTC.   Patient Active Problem List   Diagnosis Date Noted   History of genital warts 04/04/2021   Mixed hyperlipidemia 06/29/2019   Screening breast examination 01/27/2019   Seasonal allergies 11/11/2017   Plantar fascial fibromatosis of both feet 05/20/2017   Irritable bowel syndrome without diarrhea 03/11/2017   Gastroesophageal reflux disease without esophagitis 03/11/2017   Postmenopausal postcoital bleeding 08/27/2016   HTN (hypertension), benign 04/02/2016   Vitamin D deficiency 10/10/2015   Chronic knee pain 10/11/2014   Eczema 10/11/2014   History of right knee surgery 10/11/2014     Current Outpatient Medications on File Prior to Visit  Medication Sig Dispense Refill   Ascorbic Acid (VITAMIN C) 1000 MG tablet Take 1,000 mg by mouth once a week.     Black Currant Seed Oil 500 MG CAPS Take 500 mg by mouth daily.     diclofenac Sodium (VOLTAREN) 1 % GEL Apply 2 grams topically 2 (two) times daily as needed. 100 g 1   fluticasone (FLONASE) 50 MCG/ACT nasal spray Place 2 sprays into both nostrils daily. 16 g 2   hydrochlorothiazide (HYDRODIURIL) 25 MG tablet  Take 1 tablet (25 mg total) by mouth daily. 90 tablet 3   Omega-3 Fatty Acids (FISH OIL PO) Take 1 tablet by mouth daily.     Turmeric (QC TUMERIC COMPLEX) 500 MG CAPS Take 500 mg by mouth daily.     No current facility-administered medications on file prior to visit.    Allergies  Allergen Reactions   Lipitor [Atorvastatin] Other (See Comments)    Upset her stomach.    Social History   Socioeconomic History   Marital status: Single    Spouse name: Not on file   Number of children: 2   Years of education: Not on file   Highest education level: Some college, no degree  Occupational History   Not on file  Tobacco Use   Smoking status: Former    Current packs/day: 0.00    Average packs/day: 0.3 packs/day for 20.0 years (5.0 ttl pk-yrs)    Types: Cigarettes    Start date: 08/01/1995    Quit date: 08/01/2015    Years since quitting: 7.5   Smokeless tobacco: Never  Vaping Use   Vaping status: Never Used  Substance and Sexual Activity   Alcohol use: Not Currently    Alcohol/week: 2.0 - 3.0 standard drinks of alcohol    Types: 2 - 3 Cans of beer per week    Comment: 2-3 drinks beer weekend   Drug use: No   Sexual  activity: Not Currently    Birth control/protection: Surgical  Other Topics Concern   Not on file  Social History Narrative   Not on file   Social Determinants of Health   Financial Resource Strain: Medium Risk (10/08/2022)   Overall Financial Resource Strain (CARDIA)    Difficulty of Paying Living Expenses: Somewhat hard  Food Insecurity: No Food Insecurity (10/08/2022)   Hunger Vital Sign    Worried About Running Out of Food in the Last Year: Never true    Ran Out of Food in the Last Year: Never true  Transportation Needs: No Transportation Needs (10/08/2022)   PRAPARE - Administrator, Civil Service (Medical): No    Lack of Transportation (Non-Medical): No  Physical Activity: Insufficiently Active (10/08/2022)   Exercise Vital Sign    Days of  Exercise per Week: 1 day    Minutes of Exercise per Session: 20 min  Stress: No Stress Concern Present (10/08/2022)   [employer] of Occupational Health - Occupational Stress Questionnaire    Feeling of Stress : Only a little  Social Connections: Socially Isolated (10/08/2022)   Social Connection and Isolation Panel [NHANES]    Frequency of Communication with Friends and Family: Once a week    Frequency of Social Gatherings with Friends and Family: Once a week    Attends Religious Services: Never    Database administrator or Organizations: No    Attends Banker Meetings: Never    Marital Status: Divorced  Catering manager Violence: Not At Risk (10/08/2022)   Humiliation, Afraid, Rape, and Kick questionnaire    Fear of Current or Ex-Partner: No    Emotionally Abused: No    Physically Abused: No    Sexually Abused: No    Family History  Problem Relation Age of Onset   Colon cancer Father 41   Cancer Father    Cancer Maternal Grandmother    Breast cancer Cousin     Past Surgical History:  Procedure Laterality Date   ABDOMINAL HYSTERECTOMY     ANTERIOR CRUCIATE LIGAMENT REPAIR  1995   BREAST EXCISIONAL BIOPSY Bilateral    benign   CESAREAN SECTION     x2   COLONOSCOPY  09/05/2015   Dr.Jacobs   COLONOSCOPY  05/2010   PARTIAL HYSTERECTOMY     TAH   POLYPECTOMY      ROS: Review of Systems Negative except as stated above  PHYSICAL EXAM: BP (!) 140/72   Pulse 63   Temp 97.9 F (36.6 C) (Oral)   Ht 5\' 5"  (1.651 m)   Wt 172 lb (78 kg)   SpO2 95%   BMI 28.62 kg/m   Physical Exam  General appearance - alert, well appearing, and in no distress Mental status - normal mood, behavior, speech, dress, motor activity, and thought processes Chest - clear to auscultation, no wheezes, rales or rhonchi, symmetric air entry Heart - normal rate, regular rhythm, normal S1, S2, no murmurs, rubs, clicks or gallops Extremities - peripheral pulses normal, no pedal  edema, no clubbing or cyanosis Ears: Slight erythema noted at the entrance to the right ear canal.  Rest of the canal and tympanic membranes on both side within normal limits.     Latest Ref Rng & Units 12/04/2021    2:27 PM 02/01/2020    4:30 PM 02/23/2019    3:17 PM  CMP  Glucose 70 - 99 mg/dL 80  010  85   BUN 8 - 27  mg/dL 9  11  11    Creatinine 0.57 - 1.00 mg/dL 1.61  0.96  0.45   Sodium 134 - 144 mmol/L 144  145  142   Potassium 3.5 - 5.2 mmol/L 4.1  3.6  3.8   Chloride 96 - 106 mmol/L 104  103  98   CO2 20 - 29 mmol/L 26  28  27    Calcium 8.7 - 10.3 mg/dL 9.8  9.6  9.8   Total Protein 6.0 - 8.5 g/dL 6.9  7.1  7.0   Total Bilirubin 0.0 - 1.2 mg/dL 0.3  0.4  0.4   Alkaline Phos 44 - 121 IU/L 77  80  89   AST 0 - 40 IU/L 17  24  19    ALT 0 - 32 IU/L 18  28  18     Lipid Panel     Component Value Date/Time   CHOL 222 (H) 12/04/2021 1427   TRIG 80 12/04/2021 1427   HDL 54 12/04/2021 1427   CHOLHDL 4.1 12/04/2021 1427   CHOLHDL 3.7 Ratio 09/10/2008 2037   VLDL 14 09/10/2008 2037   LDLCALC 154 (H) 12/04/2021 1427    CBC    Component Value Date/Time   WBC 6.6 12/04/2021 1427   WBC 4.9 10/12/2015 0931   RBC 4.71 12/04/2021 1427   RBC 4.67 10/12/2015 0931   HGB 13.7 12/04/2021 1427   HCT 40.4 12/04/2021 1427   PLT 213 12/04/2021 1427   MCV 86 12/04/2021 1427   MCH 29.1 12/04/2021 1427   MCH 28.5 10/12/2015 0931   MCHC 33.9 12/04/2021 1427   MCHC 33.8 10/12/2015 0931   RDW 12.7 12/04/2021 1427   LYMPHSABS 1.9 10/12/2015 0931   MONOABS 0.6 10/12/2015 0931   EOSABS 0.1 10/12/2015 0931   BASOSABS 0.0 10/12/2015 0931    ASSESSMENT AND PLAN:  1. Essential hypertension Not at goal.  Continue HCTZ 25 mg daily.  Add Diovan 40 mg daily. - valsartan (DIOVAN) 40 MG tablet; Take 1 tablet (40 mg total) by mouth daily.  Dispense: 30 tablet; Refill: 4 - CBC - Comprehensive metabolic panel - Lipid panel  2. Itching of ear - acetic acid-hydrocortisone (VOSOL-HC) OTIC solution;  Use 4 drops in both ears every night for 2 weeks as needed.  Dispense: 10 mL; Refill: 0    Patient was given the opportunity to ask questions.  Patient verbalized understanding of the plan and was able to repeat key elements of the plan.   This documentation was completed using Paediatric nurse.  Any transcriptional errors are unintentional.  Orders Placed This Encounter  Procedures   CBC   Comprehensive metabolic panel   Lipid panel     Requested Prescriptions   Signed Prescriptions Disp Refills   acetic acid-hydrocortisone (VOSOL-HC) OTIC solution 10 mL 0    Sig: Use 4 drops in both ears every night for 2 weeks as needed.   valsartan (DIOVAN) 40 MG tablet 30 tablet 4    Sig: Take 1 tablet (40 mg total) by mouth daily.    Return in about 4 months (around 06/14/2023).  Jonah Blue, MD, FACP

## 2023-02-12 LAB — COMPREHENSIVE METABOLIC PANEL
ALT: 12 IU/L (ref 0–32)
AST: 19 IU/L (ref 0–40)
Albumin: 4.2 g/dL (ref 3.9–4.9)
Alkaline Phosphatase: 89 IU/L (ref 44–121)
BUN/Creatinine Ratio: 14 (ref 12–28)
BUN: 11 mg/dL (ref 8–27)
Bilirubin Total: 0.3 mg/dL (ref 0.0–1.2)
CO2: 25 mmol/L (ref 20–29)
Calcium: 9.8 mg/dL (ref 8.7–10.3)
Chloride: 100 mmol/L (ref 96–106)
Creatinine, Ser: 0.79 mg/dL (ref 0.57–1.00)
Globulin, Total: 2.3 g/dL (ref 1.5–4.5)
Glucose: 119 mg/dL — ABNORMAL HIGH (ref 70–99)
Potassium: 3.6 mmol/L (ref 3.5–5.2)
Sodium: 141 mmol/L (ref 134–144)
Total Protein: 6.5 g/dL (ref 6.0–8.5)
eGFR: 83 mL/min/{1.73_m2} (ref >59)

## 2023-02-12 LAB — LIPID PANEL
Chol/HDL Ratio: 4.3 ratio (ref 0.0–4.4)
Cholesterol, Total: 208 mg/dL — ABNORMAL HIGH (ref 100–199)
HDL: 48 mg/dL (ref >39)
LDL Chol Calc (NIH): 130 mg/dL — ABNORMAL HIGH (ref 0–99)
Triglycerides: 166 mg/dL — ABNORMAL HIGH (ref 0–149)
VLDL Cholesterol Cal: 30 mg/dL (ref 5–40)

## 2023-02-12 LAB — CBC
Hematocrit: 40.9 % (ref 34.0–46.6)
Hemoglobin: 13.3 g/dL (ref 11.1–15.9)
MCH: 27.7 pg (ref 26.6–33.0)
MCHC: 32.5 g/dL (ref 31.5–35.7)
MCV: 85 fL (ref 79–97)
Platelets: 211 10*3/uL (ref 150–450)
RBC: 4.81 x10E6/uL (ref 3.77–5.28)
RDW: 12.7 % (ref 11.7–15.4)
WBC: 6.6 10*3/uL (ref 3.4–10.8)

## 2023-03-15 ENCOUNTER — Other Ambulatory Visit: Payer: Self-pay

## 2023-03-15 ENCOUNTER — Other Ambulatory Visit: Payer: Self-pay | Admitting: Internal Medicine

## 2023-03-15 DIAGNOSIS — L299 Pruritus, unspecified: Secondary | ICD-10-CM

## 2023-04-08 DIAGNOSIS — H2513 Age-related nuclear cataract, bilateral: Secondary | ICD-10-CM | POA: Diagnosis not present

## 2023-04-10 ENCOUNTER — Other Ambulatory Visit: Payer: Self-pay

## 2023-04-10 ENCOUNTER — Telehealth: Payer: Self-pay

## 2023-04-10 NOTE — Telephone Encounter (Signed)
Copied from CRM [id number]. Topic: General - Other >> Apr 10, 2023 12:51 PM Ja-Kwan M wrote: Reason for CRM: Pt stated that she was told by [facility] that the Rx for diclofenac Sodium (VOLTAREN) 1 % GEL will not be covered and it is out of stock. Pt stated she has bone on bone pain so she needs the medication but [employer] told her that they will need an approval from her doctor. Unsure if pt request was for a prior authorization because she was not clear and just kept saying she was told [facility] needs an approval from her doctor. Pt requests that the approval be faxed to [facility] at fax# [phone]

## 2023-04-11 ENCOUNTER — Other Ambulatory Visit: Payer: Self-pay

## 2023-04-11 NOTE — Telephone Encounter (Signed)
Let patient know that prior approval was denied by her insurance for Voltaren gel.  She can get this at pharmacy for $14 out-of-pocket.  Otherwise I can prescribe an anti-inflammatory pill called meloxicam to take daily or as needed for her chronic neck pain.

## 2023-04-12 ENCOUNTER — Other Ambulatory Visit: Payer: Self-pay

## 2023-04-12 ENCOUNTER — Other Ambulatory Visit: Payer: Self-pay | Admitting: Internal Medicine

## 2023-04-12 DIAGNOSIS — I10 Essential (primary) hypertension: Secondary | ICD-10-CM

## 2023-04-12 NOTE — Telephone Encounter (Signed)
Called & spoke to the patient. Verified name & DOB. Informed that the prior approval was denied by her insurance for Voltaren gel. Informed that she is able to get at the pharmacy for $14 out-of-pocket. Otherwise a anti inflammatory prescription called Meloxicam can be sent to the pharmacy to take daily or as needed for her chronic neck pain. Patient stated that she will purchase the OTC Voltaren gel for now. No further questions at this time.

## 2023-04-15 ENCOUNTER — Other Ambulatory Visit: Payer: Self-pay | Admitting: Internal Medicine

## 2023-04-15 ENCOUNTER — Ambulatory Visit
Admission: RE | Admit: 2023-04-15 | Discharge: 2023-04-15 | Disposition: A | Discharge status: Home / Self Care | Payer: 59 | Source: Ambulatory Visit | Attending: Internal Medicine | Admitting: Internal Medicine

## 2023-04-15 DIAGNOSIS — Z0000 Encounter for general adult medical examination without abnormal findings: Secondary | ICD-10-CM

## 2023-04-15 DIAGNOSIS — M8588 Other specified disorders of bone density and structure, other site: Secondary | ICD-10-CM | POA: Diagnosis not present

## 2023-04-15 DIAGNOSIS — E349 Endocrine disorder, unspecified: Secondary | ICD-10-CM | POA: Diagnosis not present

## 2023-04-15 DIAGNOSIS — Z780 Asymptomatic menopausal state: Secondary | ICD-10-CM

## 2023-05-15 ENCOUNTER — Other Ambulatory Visit: Payer: Self-pay

## 2023-05-20 ENCOUNTER — Ambulatory Visit
Admission: RE | Admit: 2023-05-20 | Discharge: 2023-05-20 | Disposition: A | Discharge status: Home / Self Care | Payer: 59 | Source: Ambulatory Visit | Attending: Internal Medicine | Admitting: Internal Medicine

## 2023-05-20 DIAGNOSIS — Z1231 Encounter for screening mammogram for malignant neoplasm of breast: Secondary | ICD-10-CM | POA: Diagnosis not present

## 2023-05-20 DIAGNOSIS — Z0000 Encounter for general adult medical examination without abnormal findings: Secondary | ICD-10-CM

## 2023-05-23 ENCOUNTER — Other Ambulatory Visit: Payer: Self-pay

## 2023-06-17 ENCOUNTER — Encounter: Payer: Self-pay | Admitting: Internal Medicine

## 2023-06-17 ENCOUNTER — Ambulatory Visit: Payer: 59 | Attending: Internal Medicine | Admitting: Internal Medicine

## 2023-06-17 VITALS — BP 118/71 | HR 67 | Temp 97.8°F | Ht 65.0 in | Wt 170.0 lb

## 2023-06-17 DIAGNOSIS — E782 Mixed hyperlipidemia: Secondary | ICD-10-CM

## 2023-06-17 DIAGNOSIS — Z2821 Immunization not carried out because of patient refusal: Secondary | ICD-10-CM

## 2023-06-17 DIAGNOSIS — R739 Hyperglycemia, unspecified: Secondary | ICD-10-CM

## 2023-06-17 DIAGNOSIS — I10 Essential (primary) hypertension: Secondary | ICD-10-CM | POA: Diagnosis not present

## 2023-06-17 LAB — GLUCOSE, POCT (MANUAL RESULT ENTRY): POC Glucose: 123 mg/dL — AB (ref 70–99)

## 2023-06-17 LAB — POCT GLYCOSYLATED HEMOGLOBIN (HGB A1C): HbA1c, POC (controlled diabetic range): 5.2 % (ref 0.0–7.0)

## 2023-06-17 MED ORDER — VALSARTAN 40 MG PO TABS
40.0000 mg | ORAL_TABLET | Freq: Every day | ORAL | 1 refills | Status: DC
Start: 2023-06-17 — End: 2024-05-06

## 2023-06-17 MED ORDER — HYDROCHLOROTHIAZIDE 25 MG PO TABS
25.0000 mg | ORAL_TABLET | Freq: Every day | ORAL | 3 refills | Status: DC
Start: 2023-06-17 — End: 2024-05-26

## 2023-06-17 NOTE — Progress Notes (Signed)
Patient ID: Shannon [name], female    DOB: 04/30/1956  MRN: [id number]  CC: Hypertension (HTN f/u. Franchot Erichsen previous BS level /No to flu & shingles vax)   Subjective: Shannon [name] is a 67 y.o. female who presents for chronic ds management. Her concerns today include:  Pt with hx of HTN, former smoker, HL(Lipitor upset stomach; decline other statin), IBS (on Celexa for this with overlay of anxiety), GERD, seasonal allergies.   Pt concern about mild elev BS on last chem.  BS was 119.  Pt states she had just eaten some icr cream prior to getting blood draw. No fhx of DM. Loves to eat fruits and veggies. Lately she has been snacking on Ore[employer] cookies  HTN:  compliant with Diovan 40 mg and hydrochlorothiazide 25 mg Does not use salt in foods I went over her labs with her from last visit.  Her total and LDL cholesterol had improved from previous.  Patient Active Problem List   Diagnosis Date Noted   History of genital warts 04/04/2021   Mixed hyperlipidemia 06/29/2019   Screening breast examination 01/27/2019   Seasonal allergies 11/11/2017   Plantar fascial fibromatosis of both feet 05/20/2017   Irritable bowel syndrome without diarrhea 03/11/2017   Gastroesophageal reflux disease without esophagitis 03/11/2017   Postmenopausal postcoital bleeding 08/27/2016   HTN (hypertension), benign 04/02/2016   Vitamin D deficiency 10/10/2015   Chronic knee pain 10/11/2014   Eczema 10/11/2014   History of right knee surgery 10/11/2014     Current Outpatient Medications on File Prior to Visit  Medication Sig Dispense Refill   acetic acid-hydrocortisone (VOSOL-HC) OTIC solution USE 4 DROPS IN BOTH EARS EVERY NIGHT FOR 2 WEEKS AS NEEDED. 10 mL 0   Ascorbic Acid (VITAMIN C) 1000 MG tablet Take 1,000 mg by mouth once a week.     Black Currant Seed Oil 500 MG CAPS Take 500 mg by mouth daily.     diclofenac Sodium (VOLTAREN) 1 % GEL Apply 2 grams topically 2 (two) times daily as needed. 100 g  1   fluticasone (FLONASE) 50 MCG/ACT nasal spray Place 2 sprays into both nostrils daily. 16 g 2   Omega-3 Fatty Acids (FISH OIL PO) Take 1 tablet by mouth daily.     Turmeric (QC TUMERIC COMPLEX) 500 MG CAPS Take 500 mg by mouth daily.     No current facility-administered medications on file prior to visit.    Allergies  Allergen Reactions   Lipitor [Atorvastatin] Other (See Comments)    Upset her stomach.    Social History   Socioeconomic History   Marital status: Single    Spouse name: Not on file   Number of children: 2   Years of education: Not on file   Highest education level: Some college, no degree  Occupational History   Not on file  Tobacco Use   Smoking status: Former    Current packs/day: 0.00    Average packs/day: 0.3 packs/day for 20.0 years (5.0 ttl pk-yrs)    Types: Cigarettes    Start date: 08/01/1995    Quit date: 08/01/2015    Years since quitting: 7.8   Smokeless tobacco: Never  Vaping Use   Vaping status: Never Used  Substance and Sexual Activity   Alcohol use: Not Currently    Alcohol/week: 2.0 - 3.0 standard drinks of alcohol    Types: 2 - 3 Cans of beer per week    Comment: 2-3 drinks beer weekend   Drug  use: No   Sexual activity: Not Currently    Birth control/protection: Surgical  Other Topics Concern   Not on file  Social History Narrative   Not on file   Social Determinants of Health   Financial Resource Strain: Medium Risk (06/17/2023)   Overall Financial Resource Strain (CARDIA)    Difficulty of Paying Living Expenses: Somewhat hard  Food Insecurity: Food Insecurity Present (06/17/2023)   Hunger Vital Sign    Worried About Running Out of Food in the Last Year: Sometimes true    Ran Out of Food in the Last Year: Never true  Transportation Needs: No Transportation Needs (06/17/2023)   PRAPARE - Administrator, Civil Service (Medical): No    Lack of Transportation (Non-Medical): No  Physical Activity: Sufficiently Active  (06/17/2023)   Exercise Vital Sign    Days of Exercise per Week: 5 days    Minutes of Exercise per Session: 30 min  Stress: Stress Concern Present (06/17/2023)   [employer] of Occupational Health - Occupational Stress Questionnaire    Feeling of Stress : To some extent  Social Connections: Socially Isolated (06/17/2023)   Social Connection and Isolation Panel [NHANES]    Frequency of Communication with Friends and Family: Once a week    Frequency of Social Gatherings with Friends and Family: Once a week    Attends Religious Services: Never    Database administrator or Organizations: No    Attends Banker Meetings: Never    Marital Status: Divorced  Catering manager Violence: Not At Risk (06/17/2023)   Humiliation, Afraid, Rape, and Kick questionnaire    Fear of Current or Ex-Partner: No    Emotionally Abused: No    Physically Abused: No    Sexually Abused: No    Family History  Problem Relation Age of Onset   Colon cancer Father 78   Cancer Father    Cancer Maternal Grandmother    Breast cancer Cousin     Past Surgical History:  Procedure Laterality Date   ABDOMINAL HYSTERECTOMY     ANTERIOR CRUCIATE LIGAMENT REPAIR  1995   BREAST EXCISIONAL BIOPSY Bilateral    benign   CESAREAN SECTION     x2   COLONOSCOPY  09/05/2015   Dr.Jacobs   COLONOSCOPY  05/2010   PARTIAL HYSTERECTOMY     TAH   POLYPECTOMY      ROS: Review of Systems Negative except as stated above  PHYSICAL EXAM: BP 118/71 (BP Location: Left Arm, Patient Position: Sitting, Cuff Size: Normal)   Pulse 67   Temp 97.8 F (36.6 C) (Oral)   Ht 5\' 5"  (1.651 m)   Wt 170 lb (77.1 kg)   SpO2 94%   BMI 28.29 kg/m   Wt Readings from Last 3 Encounters:  06/17/23 170 lb (77.1 kg)  02/11/23 172 lb (78 kg)  10/08/22 172 lb 3.2 oz (78.1 kg)    Physical Exam  General appearance - alert, well appearing, and in no distress Mental status - normal mood, behavior, speech, dress, motor  activity, and thought processes Chest - clear to auscultation, no wheezes, rales or rhonchi, symmetric air entry Heart - normal rate, regular rhythm, normal S1, S2, no murmurs, rubs, clicks or gallops Extremities - peripheral pulses normal, no pedal edema, no clubbing or cyanosis   The 10-year ASCVD risk score (Arnett DK, et al., 2019) is: 7.6%   Values used to calculate the score:     Age: 22 years  Sex: Female     Is Non-Hispanic African American: No     Diabetic: No     Tobacco smoker: No     Systolic Blood Pressure: 118 mmHg     Is BP treated: Yes     HDL Cholesterol: 48 mg/dL     Total Cholesterol: 208 mg/dL     Latest Ref Rng & Units 02/11/2023    4:20 PM 12/04/2021    2:27 PM 02/01/2020    4:30 PM  CMP  Glucose 70 - 99 mg/dL 696  80  295   BUN 8 - 27 mg/dL 11  9  11    Creatinine 0.57 - 1.00 mg/dL 2.84  1.32  4.40   Sodium 134 - 144 mmol/L 141  144  145   Potassium 3.5 - 5.2 mmol/L 3.6  4.1  3.6   Chloride 96 - 106 mmol/L 100  104  103   CO2 20 - 29 mmol/L 25  26  28    Calcium 8.7 - 10.3 mg/dL 9.8  9.8  9.6   Total Protein 6.0 - 8.5 g/dL 6.5  6.9  7.1   Total Bilirubin 0.0 - 1.2 mg/dL 0.3  0.3  0.4   Alkaline Phos 44 - 121 IU/L 89  77  80   AST 0 - 40 IU/L 19  17  24    ALT 0 - 32 IU/L 12  18  28     Lipid Panel     Component Value Date/Time   CHOL 208 (H) 02/11/2023 1620   TRIG 166 (H) 02/11/2023 1620   HDL 48 02/11/2023 1620   CHOLHDL 4.3 02/11/2023 1620   CHOLHDL 3.7 Ratio 09/10/2008 2037   VLDL 14 09/10/2008 2037   LDLCALC 130 (H) 02/11/2023 1620    CBC    Component Value Date/Time   WBC 6.6 02/11/2023 1620   WBC 4.9 10/12/2015 0931   RBC 4.81 02/11/2023 1620   RBC 4.67 10/12/2015 0931   HGB 13.3 02/11/2023 1620   HCT 40.9 02/11/2023 1620   PLT 211 02/11/2023 1620   MCV 85 02/11/2023 1620   MCH 27.7 02/11/2023 1620   MCH 28.5 10/12/2015 0931   MCHC 32.5 02/11/2023 1620   MCHC 33.8 10/12/2015 0931   RDW 12.7 02/11/2023 1620   LYMPHSABS 1.9  10/12/2015 0931   MONOABS 0.6 10/12/2015 0931   EOSABS 0.1 10/12/2015 0931   BASOSABS 0.0 10/12/2015 0931   Results for orders placed or performed in visit on 06/17/23  POCT glycosylated hemoglobin (Hb A1C)  Result Value Ref Range   Hemoglobin A1C     HbA1c POC (<> result, manual entry)     HbA1c, POC (prediabetic range)     HbA1c, POC (controlled diabetic range) 5.2 0.0 - 7.0 %  POCT glucose (manual entry)  Result Value Ref Range   POC Glucose 123 (A) 70 - 99 mg/dl    ASSESSMENT AND PLAN: 1. Essential hypertension At goal.  Continue current medications. - hydrochlorothiazide (HYDRODIURIL) 25 MG tablet; Take 1 tablet (25 mg total) by mouth daily.  Dispense: 90 tablet; Refill: 3 - valsartan (DIOVAN) 40 MG tablet; Take 1 tablet (40 mg total) by mouth daily.  Dispense: 90 tablet; Refill: 1  2. Mixed hyperlipidemia Went over the last lipid profile with her.  Total and LDL cholesterol have improved.  She did not tolerate statins in the past.  She would like to continue trying to work on improving her eating habits to get her levels down even lower.  3. Hyperglycemia She is not diabetic.  However I encourage healthy eating habits. - POCT glycosylated hemoglobin (Hb A1C) - POCT glucose (manual entry)  4. Influenza vaccination declined   5. Herpes zoster vaccination declined     Patient was given the opportunity to ask questions.  Patient verbalized understanding of the plan and was able to repeat key elements of the plan.   This documentation was completed using Paediatric nurse.  Any transcriptional errors are unintentional.  Orders Placed This Encounter  Procedures   POCT glycosylated hemoglobin (Hb A1C)   POCT glucose (manual entry)     Requested Prescriptions   Signed Prescriptions Disp Refills   hydrochlorothiazide (HYDRODIURIL) 25 MG tablet 90 tablet 3    Sig: Take 1 tablet (25 mg total) by mouth daily.   valsartan (DIOVAN) 40 MG tablet 90  tablet 1    Sig: Take 1 tablet (40 mg total) by mouth daily.    Return in about 4 months (around 10/15/2023).  Jonah Blue, MD, FACP

## 2023-07-15 ENCOUNTER — Encounter: Payer: Self-pay | Admitting: Obstetrics and Gynecology

## 2023-07-15 ENCOUNTER — Ambulatory Visit: Payer: 59 | Admitting: Obstetrics and Gynecology

## 2023-07-15 ENCOUNTER — Other Ambulatory Visit (HOSPITAL_COMMUNITY)
Admission: RE | Admit: 2023-07-15 | Discharge: 2023-07-15 | Disposition: A | Discharge status: Home / Self Care | Payer: 59 | Source: Ambulatory Visit | Attending: Family Medicine | Admitting: Family Medicine

## 2023-07-15 VITALS — BP 166/62 | HR 58 | Ht 65.0 in | Wt 174.8 lb

## 2023-07-15 DIAGNOSIS — N898 Other specified noninflammatory disorders of vagina: Secondary | ICD-10-CM | POA: Diagnosis not present

## 2023-07-15 DIAGNOSIS — A630 Anogenital (venereal) warts: Secondary | ICD-10-CM | POA: Insufficient documentation

## 2023-07-15 NOTE — Progress Notes (Signed)
GYNECOLOGY OFFICE VISIT NOTE  History:   Shannon [name] is a 67 y.o. G3P2010 here today for hx of genital warts, with recurrence of vaginal itching. Not sexually active. Hx of hysterectomy for AUB. She denies any abnormal vaginal discharge, bleeding, pelvic pain or other concerns.    Past Medical History:  Diagnosis Date   Arthritis    BV (bacterial vaginosis)    Hypertension     Past Surgical History:  Procedure Laterality Date   ABDOMINAL HYSTERECTOMY     ANTERIOR CRUCIATE LIGAMENT REPAIR  1995   BREAST EXCISIONAL BIOPSY Bilateral    benign   CESAREAN SECTION     x2   COLONOSCOPY  09/05/2015   Dr.Jacobs   COLONOSCOPY  05/2010   PARTIAL HYSTERECTOMY     TAH   POLYPECTOMY      The following portions of the patient's history were reviewed and updated as appropriate: allergies, current medications, past family history, past medical history, past social history, past surgical history and problem list.   Health Maintenance:   Pap smear -  Pap smear not completed today. Last Pap smear was 09/10/2008 and normal. Per patient has no history of an abnormal Pap smear. Patient has a history of a hysterectomy completed 08/24/2004 due to AUB. Patient doesn't need any further Pap smears due to her history of a hysterectomy for benign reasons per BCCCP and ACOG guidelines. Last Pap smear result is in Epic.   Mammogram - Last completed 04/2023, negative. Continue annually.  Review of Systems:  Pertinent items noted in HPI and remainder of comprehensive ROS otherwise negative.  Physical Exam:  BP (!) 166/62   Pulse (!) 58   Ht 5\' 5"  (1.651 m)   Wt 174 lb 12.8 oz (79.3 kg)   BMI 29.09 kg/m  CONSTITUTIONAL: Well-developed, well-nourished female in no acute distress.  HEENT:  Normocephalic, atraumatic. External right and left ear normal. No scleral icterus.  NECK: Normal range of motion, supple, no masses noted on observation SKIN: No rash noted. Not diaphoretic. No erythema. No  pallor. MUSCULOSKELETAL: Normal range of motion. No edema noted. NEUROLOGIC: Alert and oriented to person, place, and time. Normal muscle tone coordination.  PSYCHIATRIC: Normal mood and affect. Normal behavior. Normal judgment and thought content. CARDIOVASCULAR: Normal heart rate noted RESPIRATORY: Effort and breath sounds normal, no problems with respiration noted ABDOMEN: No masses noted. No other overt distention noted.   PELVIC: Normal appearing external genitalia; normal urethral meatus; normal appearing vaginal mucosa and cervix.  No abnormal discharge noted.  Normal uterine size, no other palpable masses, no uterine or adnexal tenderness. Performed in the presence of a chaperone  Labs and Imaging No results found for this or any previous visit (from the past week). No results found.    Assessment and Plan:      1. Hx of genital warts (Primary) - Cervicovaginal ancillary only( [place])  2. Vaginal irritation - Cervicovaginal ancillary only( [place])   Normal exam. No lesions. Mild atrophy.  Recommend water base lubricant daily.  Hx of improvement following Imiquimod ([place]) 5% cream -  applied topically 3 times a week. Wash off after 6-8 hours with warm water and soap. Use until warts resolve or max of 16 weeks.   Routine preventative health maintenance measures emphasized. Please refer to After Visit Summary for other counseling recommendations.   No follow-ups on file.     Wyn Forster, MD OB Fellow, Faculty Practice [facility], [facility] 07/15/2023  8:32 AM

## 2023-07-16 LAB — CERVICOVAGINAL ANCILLARY ONLY
Bacterial Vaginitis (gardnerella): NEGATIVE
Candida Glabrata: NEGATIVE
Candida Vaginitis: NEGATIVE
Chlamydia: NEGATIVE
Comment: NEGATIVE
Comment: NEGATIVE
Comment: NEGATIVE
Comment: NEGATIVE
Comment: NEGATIVE
Comment: NORMAL
Neisseria Gonorrhea: NEGATIVE
Trichomonas: NEGATIVE

## 2023-07-30 ENCOUNTER — Telehealth: Payer: Self-pay | Admitting: Family Medicine

## 2023-07-30 NOTE — Telephone Encounter (Signed)
 Patient came in asking about her previous appointment she says a nurse was supposed to call her about test results from the visit on 12/23 she also has questions for the nurse.

## 2023-08-01 NOTE — Telephone Encounter (Signed)
 Left message that I am returning her call if she continues to have questions or concerns to please reach to the office.    Addison Naegeli, RN  08/01/23

## 2023-08-08 ENCOUNTER — Telehealth: Payer: Self-pay | Admitting: Internal Medicine

## 2023-08-08 NOTE — Telephone Encounter (Signed)
Pt is calling to speak to [place] reading setting an appt for financial assistance.  Pt received $18 bill with Wyn Forster, MD. DSS advised the patient that she has partial medicaid.  Patient would like to speak to Arjay about receiving assistance through [employer].  Please advise CB- [phone]

## 2023-08-08 NOTE — Telephone Encounter (Signed)
Shannon [name], is aware of patient call

## 2023-10-14 ENCOUNTER — Ambulatory Visit: Payer: 59 | Attending: Internal Medicine | Admitting: Internal Medicine

## 2023-10-14 ENCOUNTER — Encounter: Payer: Self-pay | Admitting: Internal Medicine

## 2023-10-14 VITALS — BP 117/71 | HR 65 | Temp 97.8°F | Ht 65.0 in | Wt 173.0 lb

## 2023-10-14 DIAGNOSIS — Z2821 Immunization not carried out because of patient refusal: Secondary | ICD-10-CM

## 2023-10-14 DIAGNOSIS — J302 Other seasonal allergic rhinitis: Secondary | ICD-10-CM | POA: Diagnosis not present

## 2023-10-14 DIAGNOSIS — I10 Essential (primary) hypertension: Secondary | ICD-10-CM | POA: Diagnosis not present

## 2023-10-14 DIAGNOSIS — L299 Pruritus, unspecified: Secondary | ICD-10-CM | POA: Diagnosis not present

## 2023-10-14 MED ORDER — LORATADINE 10 MG PO TABS: 10.0000 mg | ORAL_TABLET | Freq: Every day | ORAL | 1 refills | Status: DC

## 2023-10-14 MED ORDER — HYDROCORTISONE-ACETIC ACID 1-2 % OT SOLN
OTIC | 0 refills | Status: DC
Start: 2023-10-14 — End: 2024-04-02

## 2023-10-14 MED ORDER — FLUTICASONE PROPIONATE 50 MCG/ACT NA SUSP
2.0000 | Freq: Every day | NASAL | 2 refills | Status: DC
Start: 2023-10-14 — End: 2024-01-13

## 2023-10-14 NOTE — Progress Notes (Signed)
 Patient ID: Shannon [name], female    DOB: [birth year]  MRN: [id number]  CC: Hypertension (HTN f/u. /Cough, runny nose X3 weeks/Requesting Hydrocortisone & acetic acid ear drops /)   Subjective: Shannon [name] is a 68 y.o. female who presents for chronic ds management. Her concerns today include:  Pt with hx of HTN, former smoker, HL(Lipitor upset stomach; decline other statin), IBS (on Celexa for this with overlay of anxiety), GERD, seasonal allergies.   Discussed the use of AI scribe software for clinical note transcription with the patient, who gave verbal consent to proceed.  History of Present Illness Shannon [name] is a 68 year old female who presents with persistent upper respiratory symptoms.  She has been experiencing upper respiratory symptoms for approximately two and a half weeks, including a runny nose, sneezing, and a dry cough. These symptoms were previously more severe. No fever has been noted. She inquires about the possibility of antibiotics to alleviate her symptoms.  She mentions a recent episode of norovirus, which she contracted from her 47 year old mother. This occurred around the same time as the onset of her current symptoms, approximately two and a half weeks ago. She experienced diarrhea and vomiting during the norovirus infection, similar to her mother's symptoms.  For her itchy ears, she has been using hydrocortisone cream, which she finds helpful, although it is not specifically for ear use. She also has a hydrocortisone acetic acid solution prescribed by an ear, nose, and throat specialist in past.  Works better than cream.  Request RF.  She is currently using Flonase nasal spray and requests a refill.   HTN:  She also takes valsartan 40 mg and hydrochlorothiazide 25 mg daily for blood pressure management, which she reports is well-tolerated and effective. She adheres to a no-salt diet.   Patient Active Problem List   Diagnosis Date Noted   History  of genital warts 04/04/2021   Mixed hyperlipidemia 06/29/2019   Screening breast examination 01/27/2019   Seasonal allergies 11/11/2017   Plantar fascial fibromatosis of both feet 05/20/2017   Irritable bowel syndrome without diarrhea 03/11/2017   Gastroesophageal reflux disease without esophagitis 03/11/2017   Postmenopausal postcoital bleeding 08/27/2016   HTN (hypertension), benign 04/02/2016   Vitamin D deficiency 10/10/2015   Chronic knee pain 10/11/2014   Eczema 10/11/2014   History of right knee surgery 10/11/2014     Current Outpatient Medications on File Prior to Visit  Medication Sig Dispense Refill   Ascorbic Acid (VITAMIN C) 1000 MG tablet Take 1,000 mg by mouth once a week.     Black Currant Seed Oil 500 MG CAPS Take 500 mg by mouth daily.     hydrochlorothiazide (HYDRODIURIL) 25 MG tablet Take 1 tablet (25 mg total) by mouth daily. 90 tablet 3   Omega-3 Fatty Acids (FISH OIL PO) Take 1 tablet by mouth daily.     Turmeric (QC TUMERIC COMPLEX) 500 MG CAPS Take 500 mg by mouth daily.     valsartan (DIOVAN) 40 MG tablet Take 1 tablet (40 mg total) by mouth daily. 90 tablet 1   diclofenac Sodium (VOLTAREN) 1 % GEL Apply 2 grams topically 2 (two) times daily as needed. (Patient not taking: Reported on 10/14/2023) 100 g 1   No current facility-administered medications on file prior to visit.    Allergies  Allergen Reactions   Lipitor [Atorvastatin] Other (See Comments)    Upset her stomach.    Social History   Socioeconomic History   Marital  status: Single    Spouse name: Not on file   Number of children: 2   Years of education: Not on file   Highest education level: Some college, no degree  Occupational History   Not on file  Tobacco Use   Smoking status: Former    Current packs/day: 0.00    Average packs/day: 0.3 packs/day for 20.0 years (5.0 ttl pk-yrs)    Types: Cigarettes    Start date: 08/01/1995    Quit date: 08/01/2015    Years since quitting: 8.2    Smokeless tobacco: Never  Vaping Use   Vaping status: Never Used  Substance and Sexual Activity   Alcohol use: Not Currently    Alcohol/week: 2.0 - 3.0 standard drinks of alcohol    Types: 2 - 3 Cans of beer per week    Comment: 2-3 drinks beer weekend   Drug use: No   Sexual activity: Not Currently    Birth control/protection: Surgical  Other Topics Concern   Not on file  Social History Narrative   Not on file   Social Drivers of Health   Financial Resource Strain: Medium Risk (06/17/2023)   Overall Financial Resource Strain (CARDIA)    Difficulty of Paying Living Expenses: Somewhat hard  Food Insecurity: Food Insecurity Present (06/17/2023)   Hunger Vital Sign    Worried About Running Out of Food in the Last Year: Sometimes true    Ran Out of Food in the Last Year: Never true  Transportation Needs: No Transportation Needs (06/17/2023)   PRAPARE - Administrator, Civil Service (Medical): No    Lack of Transportation (Non-Medical): No  Physical Activity: Sufficiently Active (06/17/2023)   Exercise Vital Sign    Days of Exercise per Week: 5 days    Minutes of Exercise per Session: 30 min  Stress: Stress Concern Present (06/17/2023)   [employer] of Occupational Health - Occupational Stress Questionnaire    Feeling of Stress : To some extent  Social Connections: Socially Isolated (06/17/2023)   Social Connection and Isolation Panel [NHANES]    Frequency of Communication with Friends and Family: Once a week    Frequency of Social Gatherings with Friends and Family: Once a week    Attends Religious Services: Never    Database administrator or Organizations: No    Attends Banker Meetings: Never    Marital Status: Divorced  Catering manager Violence: Not At Risk (06/17/2023)   Humiliation, Afraid, Rape, and Kick questionnaire    Fear of Current or Ex-Partner: No    Emotionally Abused: No    Physically Abused: No    Sexually Abused: No     Family History  Problem Relation Age of Onset   Colon cancer Father 23   Cancer Father    Cancer Maternal Grandmother    Breast cancer Cousin     Past Surgical History:  Procedure Laterality Date   ABDOMINAL HYSTERECTOMY     ANTERIOR CRUCIATE LIGAMENT REPAIR  1995   BREAST EXCISIONAL BIOPSY Bilateral    benign   CESAREAN SECTION     x2   COLONOSCOPY  09/05/2015   Dr.Jacobs   COLONOSCOPY  05/2010   PARTIAL HYSTERECTOMY     TAH   POLYPECTOMY      ROS: Review of Systems Negative except as stated above  PHYSICAL EXAM: BP 117/71 (BP Location: Left Arm, Patient Position: Sitting, Cuff Size: Normal)   Pulse 65   Temp 97.8 F (36.6 C) (  Oral)   Ht 5\' 5"  (1.651 m)   Wt 173 lb (78.5 kg)   SpO2 97%   BMI 28.79 kg/m   Physical Exam  General appearance - alert, well appearing, and in no distress Mental status - normal mood, behavior, speech, dress, motor activity, and thought processes Nose - normal and patent, no erythema, discharge or polyps Mouth - mucous membranes moist, pharynx normal without lesions Neck - supple, no significant adenopathy Chest - clear to auscultation, no wheezes, rales or rhonchi, symmetric air entry Heart - normal rate, regular rhythm, normal S1, S2, no murmurs, rubs, clicks or gallops Extremities - peripheral pulses normal, no pedal edema, no clubbing or cyanosis      Latest Ref Rng & Units 02/11/2023    4:20 PM 12/04/2021    2:27 PM 02/01/2020    4:30 PM  CMP  Glucose 70 - 99 mg/dL 956  80  213   BUN 8 - 27 mg/dL 11  9  11    Creatinine 0.57 - 1.00 mg/dL 0.86  5.78  4.69   Sodium 134 - 144 mmol/L 141  144  145   Potassium 3.5 - 5.2 mmol/L 3.6  4.1  3.6   Chloride 96 - 106 mmol/L 100  104  103   CO2 20 - 29 mmol/L 25  26  28    Calcium 8.7 - 10.3 mg/dL 9.8  9.8  9.6   Total Protein 6.0 - 8.5 g/dL 6.5  6.9  7.1   Total Bilirubin 0.0 - 1.2 mg/dL 0.3  0.3  0.4   Alkaline Phos 44 - 121 IU/L 89  77  80   AST 0 - 40 IU/L 19  17  24    ALT 0 -  32 IU/L 12  18  28     Lipid Panel     Component Value Date/Time   CHOL 208 (H) 02/11/2023 1620   TRIG 166 (H) 02/11/2023 1620   HDL 48 02/11/2023 1620   CHOLHDL 4.3 02/11/2023 1620   CHOLHDL 3.7 Ratio 09/10/2008 2037   VLDL 14 09/10/2008 2037   LDLCALC 130 (H) 02/11/2023 1620    CBC    Component Value Date/Time   WBC 6.6 02/11/2023 1620   WBC 4.9 10/12/2015 0931   RBC 4.81 02/11/2023 1620   RBC 4.67 10/12/2015 0931   HGB 13.3 02/11/2023 1620   HCT 40.9 02/11/2023 1620   PLT 211 02/11/2023 1620   MCV 85 02/11/2023 1620   MCH 27.7 02/11/2023 1620   MCH 28.5 10/12/2015 0931   MCHC 32.5 02/11/2023 1620   MCHC 33.8 10/12/2015 0931   RDW 12.7 02/11/2023 1620   LYMPHSABS 1.9 10/12/2015 0931   MONOABS 0.6 10/12/2015 0931   EOSABS 0.1 10/12/2015 0931   BASOSABS 0.0 10/12/2015 0931    ASSESSMENT AND PLAN: 1. Essential hypertension (Primary) At goal.  Continue Diovan 40 mg daily and HCTZ 25 mg daily.  2. Seasonal allergies Residual symptoms most suggestive of seasonal allergies.  Does not require antibiotics.  Recommend Flonase nasal spray and Claritin - fluticasone (FLONASE) 50 MCG/ACT nasal spray; Place 2 sprays into both nostrils daily.  Dispense: 16 g; Refill: 2 - loratadine (CLARITIN) 10 MG tablet; Take 1 tablet (10 mg total) by mouth daily.  Dispense: 60 tablet; Refill: 1  3. Itching of ear - acetic acid-hydrocortisone (VOSOL-HC) OTIC solution; Use 4 drops in both ears every night for 2 weeks as needed.  Dispense: 10 mL; Refill: 0  4. Influenza vaccination declined  5.  Pneumococcal vaccination declined   Assessment and Plan     Patient was given the opportunity to ask questions.  Patient verbalized understanding of the plan and was able to repeat key elements of the plan.   This documentation was completed using Paediatric nurse.  Any transcriptional errors are unintentional.  No orders of the defined types were placed in this  encounter.    Requested Prescriptions   Signed Prescriptions Disp Refills   fluticasone (FLONASE) 50 MCG/ACT nasal spray 16 g 2    Sig: Place 2 sprays into both nostrils daily.   acetic acid-hydrocortisone (VOSOL-HC) OTIC solution 10 mL 0    Sig: Use 4 drops in both ears every night for 2 weeks as needed.   loratadine (CLARITIN) 10 MG tablet 60 tablet 1    Sig: Take 1 tablet (10 mg total) by mouth daily.    Return in about 4 months (around 02/13/2024) for Medicare Wellness Visit in 1-4  wks with CMA.  Jonah Blue, MD, FACP

## 2023-10-22 ENCOUNTER — Encounter

## 2023-11-05 ENCOUNTER — Encounter

## 2023-12-09 ENCOUNTER — Ambulatory Visit: Attending: Internal Medicine

## 2023-12-09 VITALS — Ht 65.0 in | Wt 160.0 lb

## 2023-12-09 DIAGNOSIS — Z0000 Encounter for general adult medical examination without abnormal findings: Secondary | ICD-10-CM | POA: Diagnosis not present

## 2023-12-09 NOTE — Patient Instructions (Signed)
 Shannon [name] , Thank you for taking time out of your busy schedule to complete your Annual Wellness Visit with me. I enjoyed our conversation and look forward to speaking with you again next year. I, as well as your care team,  appreciate your ongoing commitment to your health goals. Please review the following plan we discussed and let me know if I can assist you in the future. Your Game plan/ To Do List    Referrals: If you haven't heard from the office you've been referred to, please reach out to them at the phone provided.   Follow up Visits: Next Medicare AWV with our clinical staff: 12/15/2024 at 4:10p PHONE VISIT with Nurse   Have you seen your provider in the last 6 months (3 months if uncontrolled diabetes)? Yes Next Office Visit with your provider: 02/10/2024 at 3:50p OFFICE VISIT with Dr. Lincoln Renshaw  Clinician Recommendations:  Aim for 30 minutes of exercise or brisk walking, 6-8 glasses of water, and 5 servings of fruits and vegetables each day.       This is a list of the screening recommended for you and due dates:  Health Maintenance  Topic Date Due   COVID-19 Vaccine (3 - 2024-25 season) 03/24/2023   Zoster (Shingles) Vaccine (1 of 2) 01/14/2024*   Pneumonia Vaccine (1 of 1 - PCV) 10/13/2024*   Flu Shot  02/21/2024   Medicare Annual Wellness Visit  12/08/2024   Mammogram  05/19/2025   DTaP/Tdap/Td vaccine (2 - Td or Tdap) 04/02/2026   Colon Cancer Screening  09/04/2026   DEXA scan (bone density measurement)  Completed   Hepatitis C Screening  Completed   HPV Vaccine  Aged Out   Meningitis B Vaccine  Aged Out  *Topic was postponed. The date shown is not the original due date.    Advanced directives: (Declined) Advance directive discussed with you today. Even though you declined this today, please call our office should you change your mind, and we can give you the proper paperwork for you to fill out. Advance Care Planning is important because it:  [x]  Makes sure you  receive the medical care that is consistent with your values, goals, and preferences  [x]  It provides guidance to your family and loved ones and reduces their decisional burden about whether or not they are making the right decisions based on your wishes.  Follow the link provided in your after visit summary or read over the paperwork we have mailed to you to help you started getting your Advance Directives in place. If you need assistance in completing these, please reach out to us  so that we can help you!  See attachments for Preventive Care and Fall Prevention Tips.

## 2023-12-09 NOTE — Progress Notes (Signed)
 Because this visit was a virtual/telehealth visit,  certain criteria was not obtained, such a blood pressure, CBG if applicable, and timed get up and go. Any medications not marked as "taking" were not mentioned during the medication reconciliation part of the visit. Any vitals not documented were not able to be obtained due to this being a telehealth visit or patient was unable to self-report a recent blood pressure reading due to a lack of equipment at home via telehealth. Vitals that have been documented are verbally provided by the patient.   Subjective:   Shannon [name] is a 68 y.o. who presents for a Medicare Wellness preventive visit.  As a reminder, Annual Wellness Visits don't include a physical exam, and some assessments may be limited, especially if this visit is performed virtually. We may recommend an in-person follow-up visit with your provider if needed.  Visit Complete: Virtual I connected with  Shannon [name] on 12/09/23 by a audio enabled telemedicine application and verified that I am speaking with the correct person using two identifiers.  Patient Location: Home  Provider Location: Office/Clinic  I discussed the limitations of evaluation and management by telemedicine. The patient expressed understanding and agreed to proceed.  Vital Signs: Because this visit was a virtual/telehealth visit, some criteria may be missing or patient reported. Any vitals not documented were not able to be obtained and vitals that have been documented are patient reported.  VideoDeclined- This patient declined Librarian, academic. Therefore the visit was completed with audio only.  Persons Participating in Visit: Patient.  AWV Questionnaire: No: Patient Medicare AWV questionnaire was not completed prior to this visit.  Cardiac Risk Factors include: advanced age (>60men, >82 women);dyslipidemia;hypertension     Objective:     Today's Vitals   12/09/23  1627  Weight: 160 lb (72.6 kg)  Height: 5\' 5"  (1.651 m)  PainSc: 0-No pain   Body mass index is 26.63 kg/m.     12/09/2023    4:29 PM 10/08/2022    3:10 PM 05/20/2017    4:33 PM 03/11/2017   11:01 AM 06/11/2016   10:09 AM 04/02/2016    9:58 AM 10/10/2015    2:27 PM  Advanced Directives  Does Patient Have a Medical Advance Directive? No No No  No No No  Would patient like information on creating a medical advance directive? No - Patient declined Yes (MAU/Ambulatory/Procedural Areas - Information given) No - Patient declined No - Patient declined No - patient declined information No - patient declined information     Current Medications (verified) Outpatient Encounter Medications as of 12/09/2023  Medication Sig   acetic acid -hydrocortisone  (VOSOL -HC) OTIC solution Use 4 drops in both ears every night for 2 weeks as needed.   Ascorbic Acid (VITAMIN C) 1000 MG tablet Take 1,000 mg by mouth once a week.   Black Currant Seed Oil 500 MG CAPS Take 500 mg by mouth daily.   diclofenac  Sodium (VOLTAREN ) 1 % GEL Apply 2 grams topically 2 (two) times daily as needed. (Patient not taking: Reported on 10/14/2023)   fluticasone  (FLONASE ) 50 MCG/ACT nasal spray Place 2 sprays into both nostrils daily.   hydrochlorothiazide  (HYDRODIURIL ) 25 MG tablet Take 1 tablet (25 mg total) by mouth daily.   loratadine  (CLARITIN ) 10 MG tablet Take 1 tablet (10 mg total) by mouth daily.   Omega-3 Fatty Acids (FISH OIL PO) Take 1 tablet by mouth daily.   Turmeric (QC TUMERIC COMPLEX) 500 MG CAPS Take 500  mg by mouth daily.   valsartan  (DIOVAN ) 40 MG tablet Take 1 tablet (40 mg total) by mouth daily.   No facility-administered encounter medications on file as of 12/09/2023.    Allergies (verified) Lipitor [atorvastatin ]   History: Past Medical History:  Diagnosis Date   Arthritis    BV (bacterial vaginosis)    Hypertension    Past Surgical History:  Procedure Laterality Date   ABDOMINAL HYSTERECTOMY      ANTERIOR CRUCIATE LIGAMENT REPAIR  1995   BREAST EXCISIONAL BIOPSY Bilateral    benign   CESAREAN SECTION     x2   COLONOSCOPY  09/05/2015   Dr.Jacobs   COLONOSCOPY  05/2010   PARTIAL HYSTERECTOMY     TAH   POLYPECTOMY     Family History  Problem Relation Age of Onset   Colon cancer Father 2   Cancer Father    Cancer Maternal Grandmother    Breast cancer Cousin    Social History   Socioeconomic History   Marital status: Single    Spouse name: Not on file   Number of children: 2   Years of education: Not on file   Highest education level: Some college, no degree  Occupational History   Not on file  Tobacco Use   Smoking status: Former    Current packs/day: 0.00    Average packs/day: 0.3 packs/day for 20.0 years (5.0 ttl pk-yrs)    Types: Cigarettes    Start date: 08/01/1995    Quit date: 08/01/2015    Years since quitting: 8.3   Smokeless tobacco: Never  Vaping Use   Vaping status: Never Used  Substance and Sexual Activity   Alcohol use: Not Currently    Alcohol/week: 2.0 - 3.0 standard drinks of alcohol    Types: 2 - 3 Cans of beer per week    Comment: 2-3 drinks beer weekend   Drug use: No   Sexual activity: Not Currently    Birth control/protection: Surgical  Other Topics Concern   Not on file  Social History Narrative   Not on file   Social Drivers of Health   Financial Resource Strain: Medium Risk (12/09/2023)   Overall Financial Resource Strain (CARDIA)    Difficulty of Paying Living Expenses: Somewhat hard  Food Insecurity: Food Insecurity Present (12/09/2023)   Hunger Vital Sign    Worried About Running Out of Food in the Last Year: Sometimes true    Ran Out of Food in the Last Year: Never true  Transportation Needs: No Transportation Needs (12/09/2023)   PRAPARE - Administrator, Civil Service (Medical): No    Lack of Transportation (Non-Medical): No  Physical Activity: Sufficiently Active (12/09/2023)   Exercise Vital Sign    Days of  Exercise per Week: 5 days    Minutes of Exercise per Session: 30 min  Stress: No Stress Concern Present (12/09/2023)   [employer] of Occupational Health - Occupational Stress Questionnaire    Feeling of Stress : Only a little  Social Connections: Socially Isolated (12/09/2023)   Social Connection and Isolation Panel [NHANES]    Frequency of Communication with Friends and Family: Once a week    Frequency of Social Gatherings with Friends and Family: Once a week    Attends Religious Services: Never    Database administrator or Organizations: No    Attends Banker Meetings: Never    Marital Status: Divorced    Tobacco Counseling Counseling given: Not Answered  Clinical Intake:  Pre-visit preparation completed: Yes  Pain : No/denies pain Pain Score: 0-No pain     BMI - recorded: 26.63 Nutritional Status: BMI 25 -29 Overweight Nutritional Risks: None Diabetes: No  Lab Results  Component Value Date   HGBA1C 5.2 06/17/2023   HGBA1C 5.2 12/11/2016   HGBA1C 5.3 10/11/2014     How often do you need to have someone help you when you read instructions, pamphlets, or other written materials from your doctor or pharmacy?: 1 - Never  Interpreter Needed?: No  Information entered by :: Tesia Lybrand N. Lyliana Dicenso, LPN.   Activities of Daily Living     12/09/2023    4:33 PM  In your present state of health, do you have any difficulty performing the following activities:  Hearing? 0  Vision? 0  Difficulty concentrating or making decisions? 0  Walking or climbing stairs? 0  Dressing or bathing? 0  Doing errands, shopping? 0  Preparing Food and eating ? N  Using the Toilet? N  In the past six months, have you accidently leaked urine? Y  Do you have problems with loss of bowel control? N  Managing your Medications? N  Managing your Finances? N  Housekeeping or managing your Housekeeping? N    Patient Care Team: Lawrance Presume, MD as PCP - General  (Internal Medicine) Candi Chafe, Pearletha Bouche, MD as Consulting Physician (Ophthalmology)  Indicate any recent Medical Services you may have received from other than [facility] providers in the past year (date may be approximate).     Assessment:    This is a routine wellness examination for Shannon [name].  Hearing/Vision screen Hearing Screening - Comments:: Denies hearing difficulties.  Vision Screening - Comments:: Wears rx glasses - up to date with routine eye exams with R. Diedre Fox, MD.    Goals Addressed               This Visit's Progress     Patient Stated (pt-stated)        12/09/23: MY GOAL IS TO WALK MORE AND LOSE MORE WEIGHT.       Depression Screen     12/09/2023    4:34 PM 10/14/2023    4:04 PM 07/15/2023    8:32 AM 06/17/2023    2:54 PM 02/11/2023    3:44 PM 10/08/2022    3:08 PM 08/20/2022    1:48 PM  PHQ 2/9 Scores  PHQ - 2 Score 0 0 0 0 0 0 2  PHQ- 9 Score 3 3 2 2 3 2 7     Fall Risk     12/09/2023    4:31 PM 10/08/2022    3:08 PM 10/08/2022    2:56 PM 04/16/2022    1:39 PM 11/21/2020    2:48 PM  Fall Risk   Falls in the past year? 1 0 1 0 0  Number falls in past yr: 0 0 0 0 0  Injury with Fall? 1 0 0 0 0  Risk for fall due to :  No Fall Risks No Fall Risks No Fall Risks No Fall Risks  Follow up Falls evaluation completed;Falls prevention discussed        MEDICARE RISK AT HOME:  Medicare Risk at Home Any stairs in or around the home?: Yes (deck) If so, are there any without handrails?: No Home free of loose throw rugs in walkways, pet beds, electrical cords, etc?: Yes Adequate lighting in your home to reduce risk of falls?: Yes Life alert?: No  Use of a cane, walker or w/c?: No Grab bars in the bathroom?: No Shower chair or bench in shower?: No Elevated toilet seat or a handicapped toilet?: No  TIMED UP AND GO:  Was the test performed?  No  Cognitive Function: 6CIT completed    12/09/2023    4:34 PM 10/08/2022    3:20 PM  MMSE - Mini Mental State  Exam  Not completed: Unable to complete   Orientation to time  5  Orientation to Place  5  Registration  3  Attention/ Calculation  4  Recall  2  Language- name 2 objects  2  Language- repeat  1  Language- follow 3 step command  3  Language- read & follow direction  1  Write a sentence  1  Copy design  1  Total score  28        Immunizations Immunization History  Administered Date(s) Administered   PFIZER(Purple Top)SARS-COV-2 Vaccination 10/12/2019, 11/02/2019   Tdap 04/02/2016    Screening Tests Health Maintenance  Topic Date Due   COVID-19 Vaccine (3 - 2024-25 season) 03/24/2023   Zoster Vaccines- Shingrix (1 of 2) 01/14/2024 (Originally 05/23/2007)   Pneumonia Vaccine 1+ Years old (1 of 1 - PCV) 10/13/2024 (Originally 05/23/2007)   INFLUENZA VACCINE  02/21/2024   Medicare Annual Wellness (AWV)  12/08/2024   MAMMOGRAM  05/19/2025   DTaP/Tdap/Td (2 - Td or Tdap) 04/02/2026   Colonoscopy  09/04/2026   DEXA SCAN  Completed   Hepatitis C Screening  Completed   HPV VACCINES  Aged Out   Meningococcal B Vaccine  Aged Out    Health Maintenance  Health Maintenance Due  Topic Date Due   COVID-19 Vaccine (3 - 2024-25 season) 03/24/2023   Health Maintenance Items Addressed: Yes Patient is aware of current care gaps.  Patient declined vaccinations.  Additional Screening:  Vision Screening: Recommended annual ophthalmology exams for early detection of glaucoma and other disorders of the eye.  Dental Screening: Recommended annual dental exams for proper oral hygiene  Community Resource Referral / Chronic Care Management: CRR required this visit?  No   CCM required this visit?  No   Plan:    I have personally reviewed and noted the following in the patient's chart:   Medical and social history Use of alcohol, tobacco or illicit drugs  Current medications and supplements including opioid prescriptions. Patient is not currently taking opioid  prescriptions. Functional ability and status Nutritional status Physical activity Advanced directives List of other physicians Hospitalizations, surgeries, and ER visits in previous 12 months Vitals Screenings to include cognitive, depression, and falls Referrals and appointments  In addition, I have reviewed and discussed with patient certain preventive protocols, quality metrics, and best practice recommendations. A written personalized care plan for preventive services as well as general preventive health recommendations were provided to patient.   Margette Sheldon, LPN   1/61/0960   After Visit Summary: (MyChart) Due to this being a telephonic visit, the after visit summary with patients personalized plan was offered to patient via MyChart   Notes: Patient is aware of current care gaps.  Patient declined vaccinations.

## 2024-01-02 ENCOUNTER — Encounter: Payer: Self-pay | Admitting: Family Medicine

## 2024-01-02 ENCOUNTER — Ambulatory Visit: Attending: Family Medicine | Admitting: Family Medicine

## 2024-01-02 VITALS — BP 114/71 | HR 83 | Ht 65.0 in | Wt 154.8 lb

## 2024-01-02 DIAGNOSIS — R112 Nausea with vomiting, unspecified: Secondary | ICD-10-CM | POA: Diagnosis not present

## 2024-01-02 DIAGNOSIS — K449 Diaphragmatic hernia without obstruction or gangrene: Secondary | ICD-10-CM | POA: Diagnosis not present

## 2024-01-02 DIAGNOSIS — L659 Nonscarring hair loss, unspecified: Secondary | ICD-10-CM | POA: Diagnosis not present

## 2024-01-02 DIAGNOSIS — K2970 Gastritis, unspecified, without bleeding: Secondary | ICD-10-CM

## 2024-01-02 MED ORDER — FAMOTIDINE 20 MG PO TABS: 20.0000 mg | ORAL_TABLET | Freq: Every day | ORAL | 0 refills | Status: DC

## 2024-01-02 NOTE — Progress Notes (Signed)
 Subjective:  Patient ID: Shannon [name], female    DOB: January 06, 1956  Age: 68 y.o. MRN: [id number]  CC: Medical Management of Chronic Issues (Nausea /Vomiting Sheena Dennis loss)      History of Present Illness  Discussed the use of AI scribe software for clinical note transcription with the patient, who gave verbal consent to proceed.  History of Present Illness   Shannon [name] is a 68 year old female patient of Dr. Lincoln Renshaw with a history of hypertension hiatal hernia who presents with hair loss and gastrointestinal symptoms.  She experiences significant hair loss, especially during showering, without patches, itching, dandruff, or flaking. She recently started biotin, B12, and B6 supplements.  She has nausea for two months, with vomiting last Wednesday at work. Nausea and vomiting occur after certain foods, such as salads and an Egg McMuffin. She uses Pepto Bismol, Pepcid , and Tums without relief.  She strongly thinks she has gallbladder problems and would like to be evaluated for this.  She has a hiatal hernia, and endorses presence of symptoms like bloating and heartburn. Burning sensations disrupt her sleep. She started Nexium two days ago and avoids fried foods.  No family history of thyroid issues.  She initially stated she had no abdominal pain or blood in vomit or stool but then later endorsed presence of abdominal pain.       Past Medical History:  Diagnosis Date   Arthritis    BV (bacterial vaginosis)    Hypertension     Past Surgical History:  Procedure Laterality Date   ABDOMINAL HYSTERECTOMY     ANTERIOR CRUCIATE LIGAMENT REPAIR  1995   BREAST EXCISIONAL BIOPSY Bilateral    benign   CESAREAN SECTION     x2   COLONOSCOPY  09/05/2015   Dr.Jacobs   COLONOSCOPY  05/2010   PARTIAL HYSTERECTOMY     TAH   POLYPECTOMY      Family History  Problem Relation Age of Onset   Colon cancer Father 104   Cancer Father    Cancer Maternal Grandmother    Breast cancer  Cousin     Social History   Socioeconomic History   Marital status: Single    Spouse name: Not on file   Number of children: 2   Years of education: Not on file   Highest education level: Some college, no degree  Occupational History   Not on file  Tobacco Use   Smoking status: Former    Current packs/day: 0.00    Average packs/day: 0.3 packs/day for 20.0 years (5.0 ttl pk-yrs)    Types: Cigarettes    Start date: 08/01/1995    Quit date: 08/01/2015    Years since quitting: 8.4   Smokeless tobacco: Never  Vaping Use   Vaping status: Never Used  Substance and Sexual Activity   Alcohol use: Not Currently    Alcohol/week: 2.0 - 3.0 standard drinks of alcohol    Types: 2 - 3 Cans of beer per week    Comment: 2-3 drinks beer weekend   Drug use: No   Sexual activity: Not Currently    Birth control/protection: Surgical  Other Topics Concern   Not on file  Social History Narrative   Not on file   Social Drivers of Health   Financial Resource Strain: Medium Risk (12/09/2023)   Overall Financial Resource Strain (CARDIA)    Difficulty of Paying Living Expenses: Somewhat hard  Food Insecurity: Food Insecurity Present (12/09/2023)   Hunger Vital Sign  Worried About Programme researcher, broadcasting/film/video in the Last Year: Sometimes true    [employer] of Food in the Last Year: Never true  Transportation Needs: No Transportation Needs (12/09/2023)   PRAPARE - Administrator, Civil Service (Medical): No    Lack of Transportation (Non-Medical): No  Physical Activity: Sufficiently Active (12/09/2023)   Exercise Vital Sign    Days of Exercise per Week: 5 days    Minutes of Exercise per Session: 30 min  Stress: No Stress Concern Present (12/09/2023)   [employer] of Occupational Health - Occupational Stress Questionnaire    Feeling of Stress : Only a little  Social Connections: Socially Isolated (12/09/2023)   Social Connection and Isolation Panel    Frequency of Communication with Friends  and Family: Once a week    Frequency of Social Gatherings with Friends and Family: Once a week    Attends Religious Services: Never    Database administrator or Organizations: No    Attends Banker Meetings: Never    Marital Status: Divorced    Allergies  Allergen Reactions   Lipitor [Atorvastatin ] Other (See Comments)    Upset her stomach.    Outpatient Medications Prior to Visit  Medication Sig Dispense Refill   acetic acid -hydrocortisone  (VOSOL -HC) OTIC solution Use 4 drops in both ears every night for 2 weeks as needed. 10 mL 0   Ascorbic Acid (VITAMIN C) 1000 MG tablet Take 1,000 mg by mouth once a week.     Black Currant Seed Oil 500 MG CAPS Take 500 mg by mouth daily.     fluticasone  (FLONASE ) 50 MCG/ACT nasal spray Place 2 sprays into both nostrils daily. 16 g 2   hydrochlorothiazide  (HYDRODIURIL ) 25 MG tablet Take 1 tablet (25 mg total) by mouth daily. 90 tablet 3   loratadine  (CLARITIN ) 10 MG tablet Take 1 tablet (10 mg total) by mouth daily. 60 tablet 1   Omega-3 Fatty Acids (FISH OIL PO) Take 1 tablet by mouth daily.     Turmeric (QC TUMERIC COMPLEX) 500 MG CAPS Take 500 mg by mouth daily.     valsartan  (DIOVAN ) 40 MG tablet Take 1 tablet (40 mg total) by mouth daily. 90 tablet 1   diclofenac  Sodium (VOLTAREN ) 1 % GEL Apply 2 grams topically 2 (two) times daily as needed. (Patient not taking: Reported on 10/14/2023) 100 g 1   No facility-administered medications prior to visit.     ROS Review of Systems  Constitutional:  Negative for activity change and appetite change.  HENT:  Negative for sinus pressure and sore throat.   Respiratory:  Negative for chest tightness, shortness of breath and wheezing.   Cardiovascular:  Negative for chest pain and palpitations.  Gastrointestinal:  Positive for abdominal pain, nausea and vomiting. Negative for abdominal distention and constipation.  Genitourinary: Negative.   Musculoskeletal: Negative.    Psychiatric/Behavioral:  Negative for behavioral problems and dysphoric mood.     Objective:  BP 114/71   Pulse 83   Ht 5' 5 (1.651 m)   Wt 154 lb 12.8 oz (70.2 kg)   SpO2 98%   BMI 25.76 kg/m      01/02/2024    8:35 AM 12/09/2023    4:27 PM 10/14/2023    4:01 PM  BP/Weight  Systolic BP 114  086  Diastolic BP 71  71  Wt. (Lbs) 154.8 160 173  BMI 25.76 kg/m2 26.63 kg/m2 28.79 kg/m2      Physical  Exam Constitutional:      Appearance: She is well-developed.   Cardiovascular:     Rate and Rhythm: Normal rate.     Heart sounds: Normal heart sounds. No murmur heard. Pulmonary:     Effort: Pulmonary effort is normal.     Breath sounds: Normal breath sounds. No wheezing or rales.  Chest:     Chest wall: No tenderness.  Abdominal:     General: Bowel sounds are normal. There is no distension.     Palpations: Abdomen is soft. There is no mass.     Tenderness: There is no abdominal tenderness (slight epigastric TTP, neg Murphy's sign).   Musculoskeletal:        General: Normal range of motion.     Right lower leg: No edema.     Left lower leg: No edema.   Neurological:     Mental Status: She is alert and oriented to person, place, and time.   Psychiatric:        Mood and Affect: Mood normal.        Latest Ref Rng & Units 02/11/2023    4:20 PM 12/04/2021    2:27 PM 02/01/2020    4:30 PM  CMP  Glucose 70 - 99 mg/dL 956  80  387   BUN 8 - 27 mg/dL 11  9  11    Creatinine 0.57 - 1.00 mg/dL 5.64  3.32  9.51   Sodium 134 - 144 mmol/L 141  144  145   Potassium 3.5 - 5.2 mmol/L 3.6  4.1  3.6   Chloride 96 - 106 mmol/L 100  104  103   CO2 20 - 29 mmol/L 25  26  28    Calcium  8.7 - 10.3 mg/dL 9.8  9.8  9.6   Total Protein 6.0 - 8.5 g/dL 6.5  6.9  7.1   Total Bilirubin 0.0 - 1.2 mg/dL 0.3  0.3  0.4   Alkaline Phos 44 - 121 IU/L 89  77  80   AST 0 - 40 IU/L 19  17  24    ALT 0 - 32 IU/L 12  18  28      Lipid Panel     Component Value Date/Time   CHOL 208 (H)  02/11/2023 1620   TRIG 166 (H) 02/11/2023 1620   HDL 48 02/11/2023 1620   CHOLHDL 4.3 02/11/2023 1620   CHOLHDL 3.7 Ratio 09/10/2008 2037   VLDL 14 09/10/2008 2037   LDLCALC 130 (H) 02/11/2023 1620    CBC    Component Value Date/Time   WBC 6.6 02/11/2023 1620   WBC 4.9 10/12/2015 0931   RBC 4.81 02/11/2023 1620   RBC 4.67 10/12/2015 0931   HGB 13.3 02/11/2023 1620   HCT 40.9 02/11/2023 1620   PLT 211 02/11/2023 1620   MCV 85 02/11/2023 1620   MCH 27.7 02/11/2023 1620   MCH 28.5 10/12/2015 0931   MCHC 32.5 02/11/2023 1620   MCHC 33.8 10/12/2015 0931   RDW 12.7 02/11/2023 1620   LYMPHSABS 1.9 10/12/2015 0931   MONOABS 0.6 10/12/2015 0931   EOSABS 0.1 10/12/2015 0931   BASOSABS 0.0 10/12/2015 0931    Lab Results  Component Value Date   HGBA1C 5.2 06/17/2023    Lab Results  Component Value Date   TSH 0.66 10/12/2015       Assessment and Plan    Nausea and Vomiting Symptoms not consistent with gallbladder issues. Suspect gastritis or Helicobacter pylori infection but she would like to  be evaluated for cholelithiasis and so I have ordered on right upper quadrant ultrasound. - Order Helicobacter pylori breath test in two weeks to avoid false negatives due to recent Nexium use. - Prescribe Pepcid  for symptomatic relief. - Advise to avoid Nexium, omeprazole , and Prilosec until after the Helicobacter pylori test.  Gastritis Suspected gastritis possibly related to hiatal hernia or Helicobacter pylori infection. - Order Helicobacter pylori breath test to be done in 2 weeks given she took Nexium about 2 days ago and we are likely to have a false negative test result - Prescribe Pepcid  for symptomatic relief.  Hiatal Hernia Known hiatal hernia may contribute to heartburn and indigestion. - Manage with dietary modifications and antacids as needed.  Hair Loss Significant hair loss noted. Thyroid dysfunction suspected. - Order thyroid function  tests.  Follow-up Requires follow-up for test results and further evaluation. - Schedule follow-up appointment in two weeks for Helicobacter pylori test and ultrasound results. - Instruct to request a Monday appointment due to work schedule.       Meds ordered this encounter  Medications   famotidine  (PEPCID ) 20 MG tablet    Sig: Take 1 tablet (20 mg total) by mouth daily.    Dispense:  30 tablet    Refill:  0    Follow-up: Return in about 3 months (around 04/03/2024) for Medical conditions with PCP.       Joaquin Mulberry, MD, FAAFP. [facility] and [facility] [place], [place] [phone]   01/02/2024, 9:48 AM

## 2024-01-02 NOTE — Patient Instructions (Signed)
 VISIT SUMMARY:  Today, you were seen for hair loss and gastrointestinal symptoms, including nausea, vomiting, and heartburn. We discussed your current symptoms, possible causes, and a plan for further testing and treatment.  YOUR PLAN:  -NAUSEA AND VOMITING: Nausea and vomiting can be caused by various conditions, including infections and stomach issues. We suspect gastritis or a Helicobacter pylori infection. You will have a Helicobacter pylori breath test in two weeks. In the meantime, you should avoid taking Nexium, omeprazole , and Prilosec. You can take Pepcid  for relief.  -GASTRITIS: Gastritis is inflammation of the stomach lining, which can cause nausea and discomfort. It may be related to your hiatal hernia or a Helicobacter pylori infection. We will confirm this with a Helicobacter pylori breath test and you can take Pepcid  to help with symptoms.  -HIATAL HERNIA: A hiatal hernia occurs when part of the stomach pushes up through the diaphragm, which can cause heartburn and indigestion. You should continue managing this with dietary changes and antacids as needed.  -HAIR LOSS: Hair loss can have many causes, including thyroid issues. We will check your thyroid function with a blood test to determine if this is contributing to your hair loss.  INSTRUCTIONS:  Please schedule a follow-up appointment in two weeks for the Helicobacter pylori test and to review your test results. Make sure to request a Monday appointment to accommodate your work schedule.

## 2024-01-03 ENCOUNTER — Telehealth: Payer: Self-pay | Admitting: Internal Medicine

## 2024-01-03 ENCOUNTER — Ambulatory Visit: Payer: Self-pay | Admitting: Family Medicine

## 2024-01-03 ENCOUNTER — Ambulatory Visit
Admission: RE | Admit: 2024-01-03 | Discharge: 2024-01-03 | Disposition: A | Discharge status: Home / Self Care | Source: Ambulatory Visit | Attending: Family Medicine | Admitting: Family Medicine

## 2024-01-03 DIAGNOSIS — R112 Nausea with vomiting, unspecified: Secondary | ICD-10-CM

## 2024-01-03 DIAGNOSIS — K449 Diaphragmatic hernia without obstruction or gangrene: Secondary | ICD-10-CM

## 2024-01-03 DIAGNOSIS — K2970 Gastritis, unspecified, without bleeding: Secondary | ICD-10-CM

## 2024-01-03 DIAGNOSIS — R109 Unspecified abdominal pain: Secondary | ICD-10-CM | POA: Diagnosis not present

## 2024-01-03 DIAGNOSIS — E0590 Thyrotoxicosis, unspecified without thyrotoxic crisis or storm: Secondary | ICD-10-CM

## 2024-01-03 DIAGNOSIS — R7989 Other specified abnormal findings of blood chemistry: Secondary | ICD-10-CM

## 2024-01-03 DIAGNOSIS — K828 Other specified diseases of gallbladder: Secondary | ICD-10-CM | POA: Diagnosis not present

## 2024-01-03 DIAGNOSIS — K810 Acute cholecystitis: Secondary | ICD-10-CM

## 2024-01-03 DIAGNOSIS — K8020 Calculus of gallbladder without cholecystitis without obstruction: Secondary | ICD-10-CM | POA: Diagnosis not present

## 2024-01-03 LAB — TSH: TSH: <0.005 u[IU]/mL — ABNORMAL LOW (ref 0.450–4.500)

## 2024-01-03 LAB — T3: T3, Total: 449 ng/dL — ABNORMAL HIGH (ref 71–180)

## 2024-01-03 LAB — T4, FREE: Free T4: 5.13 ng/dL — ABNORMAL HIGH (ref 0.82–1.77)

## 2024-01-03 NOTE — Telephone Encounter (Addendum)
 [facility] called stated that the patient is calling in regards to her ultrasound results addressed by Dr.Newlin. Clarisa could you please call the patient to go over her results. Thank you.

## 2024-01-03 NOTE — Telephone Encounter (Signed)
 Addressed by Dr. Newlin already.

## 2024-01-03 NOTE — Telephone Encounter (Signed)
 Copied from CRM [id number]. Topic: Clinical - Lab/Test Results >> Jan 03, 2024  9:33 AM Annice Kim wrote:  Reason for CRM: Sherrlyn Dolores from [facility] called with results please follow up  [phone]

## 2024-01-03 NOTE — Telephone Encounter (Signed)
 Called and spoke to the patient. Verified name & DOB. Informed of ultrasound results. Patient expressed verbal understanding.

## 2024-01-03 NOTE — Telephone Encounter (Signed)
 Copied from CRM [id number]. Topic: Clinical - Lab/Test Results >> Jan 03, 2024  2:25 PM Ivette P wrote:  Reason for CRM: pt went in for an ultrasound today and was advised to call back in at 2pm to get results.   Called [facility] and spoke to zara who said she would send a message and have the nurse reach out to pt.   Please call pt regarding results.   Pt will be busy between 4:00 pm -4:30PM

## 2024-01-07 ENCOUNTER — Ambulatory Visit: Payer: Self-pay

## 2024-01-07 NOTE — Telephone Encounter (Signed)
 FYI Only or Action Required?: FYI only for provider  Patient was last seen in primary care on 01/02/2024 by Joaquin Mulberry, MD. Called Nurse Triage reporting Advice Only. Symptoms began several months ago. Interventions attempted: Prescription medications: pepcid . Symptoms are: gradually improving.  Triage Disposition: Information or Advice Only Call  Patient/caregiver understands and will follow disposition?: Yes   Copied from CRM [id number]. Topic: Clinical - Red Word Triage >> Jan 07, 2024 11:47 AM Elle L wrote: Red Word that prompted transfer to Nurse Triage: The patient called stating she had an x-ray that found kidney stones previously. However, the patient is having extreme fatigue, she cannot stand up for long periods of time, and she has extreme nausea. Reason for Disposition  Health Information question, no triage required and triager able to answer question  Answer Assessment - Initial Assessment Questions 1. REASON FOR CALL or QUESTION: What is your reason for calling today? or How can I best help you? or What question do you have that I can help answer?     Follow up on recent ultrasound results, reports decreased episodes of vomiting, but still experiencing nausea.   Results from Dr. Newlin read from chart.  Hello, Your ultrasound reveals presence of gallstones with some inflammation.  I have placed a referral to general surgery for further evaluation.  Their office will contact you with an appointment. -Dr. Newlin  Phone number from referral read from chart, [phone] Patient verbalizes understanding and has no other questions at this time.  Protocols used: Information Only Call - No Triage-A-AH

## 2024-01-08 ENCOUNTER — Other Ambulatory Visit: Payer: Self-pay | Admitting: General Surgery

## 2024-01-08 DIAGNOSIS — K838 Other specified diseases of biliary tract: Secondary | ICD-10-CM

## 2024-01-08 DIAGNOSIS — K8020 Calculus of gallbladder without cholecystitis without obstruction: Secondary | ICD-10-CM | POA: Diagnosis not present

## 2024-01-09 ENCOUNTER — Telehealth: Payer: Self-pay | Admitting: Internal Medicine

## 2024-01-09 DIAGNOSIS — K838 Other specified diseases of biliary tract: Secondary | ICD-10-CM | POA: Diagnosis not present

## 2024-01-09 NOTE — Telephone Encounter (Signed)
 Copied from CRM [id number]. Topic: Clinical - Request for Lab/Test Order >> Jan 09, 2024 10:40 AM Sophia H wrote:  Reason for CRM: Patient is calling in regarding MRI that she said she was waiting on, I believe there are orders but I'm not 100%. Please reach out to patient and advise on next steps. She is stating she wants to get this done while she's not too busy with work. # [id number]. **Phone only, no my chart

## 2024-01-10 NOTE — Telephone Encounter (Signed)
 Patient identified by name and date of birth.  Patient aware of information give by provider and stated that an appointment has already been scheduled for her to complete the MR.

## 2024-01-10 NOTE — Telephone Encounter (Signed)
 She would need to call Dr. Detra Flower office to facilitate her request since this was not ordered by us .

## 2024-01-11 ENCOUNTER — Other Ambulatory Visit: Payer: Self-pay | Admitting: Internal Medicine

## 2024-01-11 DIAGNOSIS — J302 Other seasonal allergic rhinitis: Secondary | ICD-10-CM

## 2024-01-16 ENCOUNTER — Ambulatory Visit: Attending: Internal Medicine

## 2024-01-16 ENCOUNTER — Telehealth: Payer: Self-pay

## 2024-01-16 DIAGNOSIS — K2970 Gastritis, unspecified, without bleeding: Secondary | ICD-10-CM

## 2024-01-16 DIAGNOSIS — R7989 Other specified abnormal findings of blood chemistry: Secondary | ICD-10-CM

## 2024-01-16 DIAGNOSIS — R112 Nausea with vomiting, unspecified: Secondary | ICD-10-CM | POA: Diagnosis not present

## 2024-01-16 NOTE — Telephone Encounter (Signed)
 Copied from CRM [id number]. Topic: Clinical - Lab/Test Results >> Jan 16, 2024  9:36 AM Berwyn MATSU wrote: Reason for CRM: Per patient Dr. Ann is requesting a copy of the lab test that were completed today.   Phone: [phone] Fax: [phone]

## 2024-01-17 ENCOUNTER — Other Ambulatory Visit: Payer: Self-pay | Admitting: Internal Medicine

## 2024-01-17 ENCOUNTER — Other Ambulatory Visit

## 2024-01-17 ENCOUNTER — Telehealth: Payer: Self-pay | Admitting: Internal Medicine

## 2024-01-17 MED ORDER — FAMOTIDINE 20 MG PO TABS: 20.0000 mg | ORAL_TABLET | Freq: Every day | ORAL | 0 refills | Status: DC

## 2024-01-17 NOTE — Telephone Encounter (Signed)
 Copied from CRM [id number]. Topic: Clinical - Lab/Test Results >> Jan 17, 2024 11:24 AM Ivette P wrote:  Reason for CRM: PT received notification from myChart results were ready. Pt would like to know results. Advised primary needs to review before and can make determination.   Pt would like a follow up on results   [phone]

## 2024-01-17 NOTE — Telephone Encounter (Signed)
 Copied from CRM [id number]. Topic: Clinical - Medication Refill >> Jan 17, 2024 12:51 PM Nathanel BROCKS wrote: Pt had an mri scheduled and they cancelled, machine was down and this medication helped with the nausea.  Medication: famotidine  (PEPCID ) 20 MG tablet   Has the patient contacted their pharmacy? No   This is the patient's preferred pharmacy:  [facility] [address] [place] - [address] [place] [address] Phone: [phone] Fax: [phone]  Is this the correct pharmacy for this prescription? Yes If no, delete pharmacy and type the correct one.   Has the prescription been filled recently? Yes  Is the patient out of the medication? Yes  Has the patient been seen for an appointment in the last year OR does the patient have an upcoming appointment? Yes  Can we respond through MyChart? Yes  Agent: Please be advised that Rx refills may take up to 3 business days. We ask that you follow-up with your pharmacy.

## 2024-01-17 NOTE — Telephone Encounter (Signed)
 Correct. Patient will be called when provider has reviewed. Awaiting for provider message.

## 2024-01-18 LAB — H. PYLORI BREATH COLLECTION

## 2024-01-18 LAB — H. PYLORI BREATH TEST

## 2024-01-18 LAB — T4, FREE: Free T4: 5.45 ng/dL — ABNORMAL HIGH (ref 0.82–1.77)

## 2024-01-18 LAB — TSH: TSH: <0.005 u[IU]/mL — ABNORMAL LOW (ref 0.450–4.500)

## 2024-01-18 LAB — T3: T3, Total: 439 ng/dL — ABNORMAL HIGH (ref 71–180)

## 2024-01-18 MED ORDER — METHIMAZOLE 5 MG PO TABS: 5.0000 mg | ORAL_TABLET | Freq: Three times a day (TID) | ORAL | 1 refills | Status: DC

## 2024-01-20 ENCOUNTER — Other Ambulatory Visit

## 2024-01-20 ENCOUNTER — Ambulatory Visit: Payer: Self-pay

## 2024-01-20 NOTE — Telephone Encounter (Signed)
 Patient schedule my chart visit to  address concerns

## 2024-01-20 NOTE — Telephone Encounter (Signed)
 FYI Only or Action Required?: Action required by provider: lab or test result follow-up needed.  Patient was last seen in primary care on 01/02/2024 by Newlin, Enobong, MD. Called Nurse Triage reporting Results. Symptoms began today. Interventions attempted: Nothing. Symptoms are: questions about her hyperthyroidism and the medication methimazole (patient has not started taking it yet) stable.  Triage Disposition: Call PCP Within 24 Hours  Patient/caregiver understands and will follow disposition?: Yes                 Copied from CRM [id number]. Topic: Clinical - Lab/Test Results >> Jan 20, 2024 12:09 PM Powell HERO wrote: Reason for CRM: Go over recent results for h pylori Reason for Disposition  Caller requesting lab results  (Exception: Routine or non-urgent lab result.)  Answer Assessment - Initial Assessment Questions 1. REASON FOR CALL or QUESTION: What is your reason for calling today? or How can I best help you? or What question do you have that I can help answer?      Read comment from Dr Delbert: Hello, Your repeat labs reveal you do have hyperthyroidism. I have sent a Prescription for methimazole to your Pharmacy. Please follow up with your PCP for repeat labs. Your test for helicobacter pylori is negative. Best, -Dr Delbert  Patient has further questions about her hyperthyroidism and if she is allowed to drink on the medication. Advised the medication recommends avoiding or limiting alcohol consumption. Patient would like to discuss with PCP if she will need to be on medication for the rest of her life and if this could be causing her nausea.   2. CALLER: Document the source of call. (e.g., laboratory, patient).     Patient.  Protocols used: PCP Call - No Triage-A-AH

## 2024-01-20 NOTE — Telephone Encounter (Signed)
 FYI Only or Action Required?: Action required by provider: clinical question for provider.  Patient was last seen in primary care on 01/02/2024 by Newlin, Enobong, MD. Called Nurse Triage reporting medication question. Symptoms began several days ago. Interventions attempted: Prescription medications: Pepcid . Symptoms are: unchanged.  Triage Disposition: Information or Advice Only Call  Patient/caregiver understands and will follow disposition?: Yes  Copied from CRM [id number]. Topic: Clinical - Red Word Triage >> Jan 20, 2024 12:43 PM DeAngela L wrote: Red Word that prompted transfer to Nurse Triage: patient was calling back to ask the NT if the thyroid med methimazole (TAPAZOLE) is going to help with the nausea  And if not can she take the famotidine  (PEPCID ) 20 MG tablet in combination with this meds safely  Pt num [phone] (M) Patient picked up medication thinking it would have been her refill for the pepcid  and got thyroid meds instead and has questions Reason for Disposition  Caller has medicine question only, adult not sick, AND triager answers question  Answer Assessment - Initial Assessment Questions Pt asking if new thyroid med will relieve nausea and if thyroid med and pepcid  are safe to take together. Nurse explained if MD placed pt on thyroid med d/t thyroid imbalances related to nausea then it could, but that it is safe to take thyroid meds and pepcid  together. Pt report still has no received her pepcid  order from 01/17/24 yet, pt notified new orders can take up to 3 business days to fill. Pt also states she has to take her Pepcid  20mg  daily up to BID bc she needs it at least twice a day, like when she is at work. Can pt have new order for Pepcid  to be taken more than once a day.  Pt requests to be called back at phone: [phone] d/t not having access to her Mychart.   1. NAME of MEDICINE: What medicine(s) are you calling about?     Pepcid  and thyroid med is okay to take  together.  New meds take 3 full business day (weekends not included)  2. QUESTION: What is your question? (e.g., double dose of medicine, side effect)     Why pepcid  order not ready? Safe to take thyroid med and pepcid  together?   3. PRESCRIBER: Who prescribed the medicine? Reason: if prescribed by specialist, call should be referred to that group.     PCP  4. SYMPTOMS: Do you have any symptoms? If Yes, ask: What symptoms are you having?  How bad are the symptoms (e.g., mild, moderate, severe)     Had some recent nausea  she has been dealing with and has meds ordered for.  Protocols used: Medication Question Call-A-AH

## 2024-01-21 ENCOUNTER — Ambulatory Visit: Attending: Internal Medicine | Admitting: Internal Medicine

## 2024-01-21 DIAGNOSIS — E0590 Thyrotoxicosis, unspecified without thyrotoxic crisis or storm: Secondary | ICD-10-CM

## 2024-01-21 DIAGNOSIS — R109 Unspecified abdominal pain: Secondary | ICD-10-CM

## 2024-01-21 NOTE — Progress Notes (Signed)
 Patient ID: Shannon [name], female   DOB: 1955-12-01, 68 y.o.   MRN: [id number] Virtual Visit via Video Note  I connected with Winton Jolee Diluzio on 01/21/2024 at 8:22 a.m by a video enabled telemedicine application and verified that I am speaking with the correct person using two identifiers. We were able to do face-to-face a video visit for the first 5 minutes of this encounter and then ran into connection issues.  The remaining 9 minutes had to be done via telephone. Location: Patient: home Provider: Office   I discussed the limitations of evaluation and management by telemedicine and the availability of in person appointments. The patient expressed understanding and agreed to proceed.  History of Present Illness: Pt with hx of HTN, former smoker, HL(Lipitor upset stomach; decline other statin), IBS (on Celexa  for this with overlay of anxiety), GERD, seasonal allergies.   Discussed the use of AI scribe software for clinical note transcription with the patient, who gave verbal consent to proceed.  History of Present Illness Shannon [name] is a 68 year old female with hyperthyroidism who presents with concerns about her recent diagnosis and medication management.  She is currently taking methimazole 5 mg TID for recent dx of hyperthyroidism. She is wanting to know if she would be able to drink her usual 3-4 12 oz of beers when she socializes on Saturdays. She is concerned the medication may interact with alcohol. She also inquired about switching to levothyroxine, mistakenly believing it was appropriate for her condition. She states that her friend is on Levothyroxine and told her that it does not interact with ETOH use. She endorses weight loss from 179 pounds now to 148 pounds.  She denies any palpitations.  She does feel hot a lot.  She also indicated to me that she was referred to a general surgeon for evaluation of gallstones.  Surgeon states she was not convinced that her symptoms are  primarily due to cholelithiasis as patient does have GERD and IBS.  However given mild CBD dilatation, MRCP will be obtained.  This is scheduled for the eighth of this month.  Patient currently on famotidine  20 mg daily.  She is requesting an increased dose as she finds it helpful.  Updated prescription sent to the pharmacy.  Patient wondering when her next visit will be.  Has a lab visit scheduled for later this mth that she though was a f/u with me.     Outpatient Encounter Medications as of 01/21/2024  Medication Sig   acetic acid -hydrocortisone  (VOSOL -HC) OTIC solution Use 4 drops in both ears every night for 2 weeks as needed.   Ascorbic Acid (VITAMIN C) 1000 MG tablet Take 1,000 mg by mouth once a week.   Black Currant Seed Oil 500 MG CAPS Take 500 mg by mouth daily.   diclofenac  Sodium (VOLTAREN ) 1 % GEL Apply 2 grams topically 2 (two) times daily as needed. (Patient not taking: Reported on 10/14/2023)   famotidine  (PEPCID ) 20 MG tablet Take 1 tablet (20 mg total) by mouth daily.   fluticasone  (FLONASE ) 50 MCG/ACT nasal spray SPRAY 2 SPRAYS INTO EACH NOSTRIL EVERY DAY   hydrochlorothiazide  (HYDRODIURIL ) 25 MG tablet Take 1 tablet (25 mg total) by mouth daily.   loratadine  (CLARITIN ) 10 MG tablet Take 1 tablet (10 mg total) by mouth daily.   methimazole (TAPAZOLE) 5 MG tablet Take 1 tablet (5 mg total) by mouth 3 (three) times daily.   Omega-3 Fatty Acids (FISH OIL PO) Take 1 tablet by  mouth daily.   Turmeric (QC TUMERIC COMPLEX) 500 MG CAPS Take 500 mg by mouth daily.   valsartan  (DIOVAN ) 40 MG tablet Take 1 tablet (40 mg total) by mouth daily.   No facility-administered encounter medications on file as of 01/21/2024.      Observations/Objective: Older Caucasian female in NAD  Assessment and Plan: 1. Hyperthyroidism (Primary) Advised patient that levothyroxine is used to treat low functioning thyroid so that would not apply to her. I looked up and did not see any significant  interaction of methimazole with alcohol use. I also confirmed with our clinical pharmacist.  However advised patient that for women on special occasions it is recommended to consume no more than 2 standard alcoholic beverages in 1 setting.  This means on Saturday nights when she is out socializing it should be no more than two 12 oz beer for the night.  Advised that if taking methimazole 3 times a day is cumbersome for her, she can change to taking 5 mg 2 tablets in the morning and 1 tablet in the evening.  I recommend referral to endocrinology as well.  Will need thyroid uptake scan once hormone levels are under control.  2. Abdominal pain, unspecified abdominal location Currently in the process of workup by the general surgeon.  MRCP scheduled for next week.   Follow Up Instructions: Early August.   I discussed the assessment and treatment plan with the patient. The patient was provided an opportunity to ask questions and all were answered. The patient agreed with the plan and demonstrated an understanding of the instructions.   The patient was advised to call back or seek an in-person evaluation if the symptoms worsen or if the condition fails to improve as anticipated.  I spent 19 minutes dedicated to the care of this patient on the date of this encounter to include previsit review of chart looking at Dr. Millard note, surgeons note, labs/US  results, face to face time and telephone time discussing dx and management with pt.  This note has been created with Education officer, environmental. Any transcriptional errors are unintentional.  Barnie Louder, MD

## 2024-01-21 NOTE — Telephone Encounter (Signed)
 Copied from CRM [id number]. Topic: Referral - Prior Authorization Question >> Jan 21, 2024  4:46 PM Berneda F wrote: Reason for CRM: Jenkins from [employer] is requesting a prior authorization for famotidine  (PEPCID ) 20 MG tablet.  Callback number is [phone]

## 2024-01-22 ENCOUNTER — Encounter: Payer: Self-pay | Admitting: Internal Medicine

## 2024-01-22 ENCOUNTER — Other Ambulatory Visit: Payer: Self-pay | Admitting: Internal Medicine

## 2024-01-22 ENCOUNTER — Other Ambulatory Visit: Payer: Self-pay

## 2024-01-22 MED ORDER — FAMOTIDINE 20 MG PO TABS: 20.0000 mg | ORAL_TABLET | Freq: Two times a day (BID) | ORAL | 1 refills | Status: DC

## 2024-01-22 NOTE — Telephone Encounter (Signed)
 Called & spoke to the patient. Verified name & DOB. Informed that lab appointment will need to be cancelled and follow-up appointment moved to a sooner date in early August. Currently the first week of August is unavailable and patient can only come in on a Monday. Appointment scheduled for 03/09/2024.  Informed patient of Dr.Johnson's message Dr.Johnson confirmed with the pharmacist that there is no significant interaction with the thyroid medicine and beer. However, as stated yesterday, should not drink more that two 12 oz beet at special settings. Patient expressed verbal understanding of all discussed. No further questions / concerns at this time.

## 2024-01-22 NOTE — Telephone Encounter (Signed)
 Called & spoke to the patient. Verified name & DOB. Informed that a ROI will need to be completed so we are able to send out those results. Patient stated that she is unable to do so at this time. She will show Dr.Burton her Mychart with results and will call back if there are any further questions. No further assistance needed at this time.

## 2024-01-22 NOTE — Progress Notes (Signed)
 Please cancel lab appt for later this month and move up appt with me to early August . Will plan to recheck thyroid level at that time. Let her know also that I have confirmed with the pharmacist that there is no significant interaction with the thyroid medicine and beer.  However, as stated yesterday, should not drink more than two 12 oz beer at special settings.

## 2024-01-22 NOTE — Telephone Encounter (Signed)
 Spoke with patient to advise that  famotidine  (PEPCID ) 20 MG tablet is covered by her insurance and will not need a PA. Patient had questions about her next appointment . Patient had VV with PCP 01/21/2024 and voiced it was not made clear when she needed to be seen for her follow-up. Patient has an appointment 04/06/2024 and wanted to know it that appointment is the only one she needs. Secondly , she ask about what labs she was scheduled to have drawn 02/10/2024. I was unable to determine because I did not see that any labs had been scheduled. Advised patient that I would send a message to her PCP and get back with her. Routing to PCP. Please advise.

## 2024-01-22 NOTE — Telephone Encounter (Signed)
 Please cancel lab appt for later this month and move up appt with me to early August . Will plan to recheck thyroid level at that time. Let her know also that I have confirmed with the pharmacist that there is no significant interaction with the thyroid medicine and beer.  However, as stated yesterday, should not drink more than two 12 oz beer at special settings.

## 2024-01-27 DIAGNOSIS — E0590 Thyrotoxicosis, unspecified without thyrotoxic crisis or storm: Secondary | ICD-10-CM | POA: Diagnosis not present

## 2024-01-28 ENCOUNTER — Ambulatory Visit
Admission: RE | Admit: 2024-01-28 | Discharge: 2024-01-28 | Disposition: A | Discharge status: Home / Self Care | Source: Ambulatory Visit | Attending: General Surgery | Admitting: General Surgery

## 2024-01-28 DIAGNOSIS — R109 Unspecified abdominal pain: Secondary | ICD-10-CM | POA: Diagnosis not present

## 2024-01-28 DIAGNOSIS — K838 Other specified diseases of biliary tract: Secondary | ICD-10-CM | POA: Diagnosis not present

## 2024-01-28 DIAGNOSIS — R112 Nausea with vomiting, unspecified: Secondary | ICD-10-CM | POA: Diagnosis not present

## 2024-01-28 DIAGNOSIS — K8020 Calculus of gallbladder without cholecystitis without obstruction: Secondary | ICD-10-CM | POA: Diagnosis not present

## 2024-01-28 MED ORDER — GADOPICLENOL 0.5 MMOL/ML IV SOLN
7.5000 mL | Freq: Once | INTRAVENOUS | Status: AC | PRN
  Administered 2024-01-28: 7 mL via INTRAVENOUS

## 2024-01-30 NOTE — Telephone Encounter (Signed)
 Copied from CRM [id number]. Topic: Clinical - Lab/Test Results >> Jan 30, 2024 11:38 AM Antwanette L wrote:  Reason for CRM: Patient is calling to get results  MRI. Please contact the patient at [phone] before 1pm. If you cannot contact the patient before 1pm, please still call the patient.

## 2024-01-30 NOTE — Telephone Encounter (Signed)
 MRI of the abdomen showed some gallstones and mild dilation of the gallbladder duct.  She should follow-up with the surgeon to see what next step is.

## 2024-01-31 NOTE — Telephone Encounter (Signed)
 Called & spoke to the patient. Verified name & DOB. Informed of results & recommendations. Patient stated that she is currently waiting to hear back from the surgeon. Patient expressed verbal understanding of all discussed.

## 2024-02-04 ENCOUNTER — Ambulatory Visit: Payer: Self-pay

## 2024-02-04 MED ORDER — ONDANSETRON HCL 4 MG PO TABS: 4.0000 mg | ORAL_TABLET | Freq: Every day | ORAL | 0 refills | Status: DC | PRN

## 2024-02-04 NOTE — Telephone Encounter (Signed)
 Requesting medication to get for nausea.  She states that famotadine is not helping as much. She is not sure if it is due to medication.   She denies chest pain or back pain.   She has been scheduled 04/06/2024.

## 2024-02-04 NOTE — Telephone Encounter (Signed)
 FYI Only or Action Required?: Action required by provider: clinical question for provider and update on patient condition.  Patient was last seen in primary care on 01/21/2024 by Vicci Barnie NOVAK, MD.  Called Nurse Triage reporting Nausea and Medication Problem.  Symptoms began several months ago.  Interventions attempted: Other: not specified.  Symptoms are: stable.  Triage Disposition: Call PCP Within 24 Hours  Patient/caregiver understands and will follow disposition?: Yes                             Copied from CRM [id number]. Topic: Clinical - Medication Question >> Feb 04, 2024 10:40 AM Turkey B wrote: Reason for CRM: pt called in asked if the med,  methimazole  can be switched to something else, because this makes her nauseous  Reason for Disposition  Taking prescription medication that could cause nausea (e.g., narcotics/opiates, antibiotics, OCPs, many others)  Answer Assessment - Initial Assessment Questions 1. NAUSEA SEVERITY: How bad is the nausea? (e.g., mild, moderate, severe; dehydration, weight loss)     Mild-moderate 2. ONSET: When did the nausea begin?     Ongoing since May 3. VOMITING: Any vomiting? If Yes, ask: How many times today?     Denies 5. CAUSE: What do you think is causing the nausea?     Gallbladder- states she is going to have her gallbladder removed in September, states methimazole  is making nausea worse    States she is taking 10 MG of methimazole  and thyroid doctor made recommendation to take an alternative medication to help with nausea. Patient is inquiring if provider can make this adjustment. Patient requesting a call back after 3pm today.  Protocols used: Nausea-A-AH

## 2024-02-04 NOTE — Telephone Encounter (Signed)
 Can you please call in the Zofran  prescription. For some odd reason it keeps printing and not going through as an e-script to her pharmacy. Then let pt know it was sent in.

## 2024-02-05 NOTE — Telephone Encounter (Signed)
 Called & spoke to the patient. Verified name & DOB. Informed that Zofran  has been sent to the pharmacy. Patient would like to confirm if she is able to take famotidine , zofran  and thyroid medication all at the same time. Please advise.

## 2024-02-05 NOTE — Telephone Encounter (Signed)
 Called but no answer. LVM to call back.

## 2024-02-05 NOTE — Telephone Encounter (Signed)
 Yes

## 2024-02-05 NOTE — Telephone Encounter (Signed)
 Called & spoke to the patient. Verified name & DOB. Informed that per Dr.Johnson it is ok to take all three medications together. Patient expressed verbal understanding.

## 2024-02-05 NOTE — Telephone Encounter (Signed)
 Copied from CRM [id number]. Topic: General - Call Back - No Documentation >> Feb 05, 2024  2:14 PM Avram G wrote:  Reason for CRM: patient mentioned she missed a call today. Patient stated to leave a detail vm she is heading to work. She only wants to know If she can mix two medications.

## 2024-02-05 NOTE — Telephone Encounter (Signed)
 Rx called to pharmacy for Zofran .  Patient made aware.

## 2024-02-10 ENCOUNTER — Other Ambulatory Visit: Payer: Self-pay | Admitting: Family Medicine

## 2024-02-10 ENCOUNTER — Other Ambulatory Visit

## 2024-02-10 ENCOUNTER — Ambulatory Visit: Admitting: Internal Medicine

## 2024-02-17 ENCOUNTER — Telehealth: Payer: Self-pay | Admitting: Internal Medicine

## 2024-02-17 NOTE — Telephone Encounter (Signed)
 This medication is it for treating over functioning thyroid.  She would need to wait until she is seen by the endocrinologist to see what other therapy would be recommended. Try taking MiraLAX  or Colace over-the-counter as needed for constipation. Use Zofran  as needed for nausea.

## 2024-02-17 NOTE — Telephone Encounter (Signed)
 Copied from CRM [id number]. Topic: Clinical - Prescription Issue >> Feb 17, 2024 12:03 PM Shannon [name] wrote:  Reason for CRM: Pt is calling because her methimazole  (TAPAZOLE ) 5 MG tablet is on back order with [facility]. The medicine is causing nausea and constipation. Please have Dr. Vicci  prescribe some medicine that doesn't cause nausea and constipation . Please contact the patient at [phone].   [facility] Details [facility] [address], [place] - [address] [place] [address] Phone: [phone] Fax: [phone]

## 2024-02-18 ENCOUNTER — Other Ambulatory Visit: Payer: Self-pay | Admitting: Internal Medicine

## 2024-02-18 ENCOUNTER — Telehealth: Payer: Self-pay

## 2024-02-18 NOTE — Telephone Encounter (Signed)
 Copied from CRM #[id number]. Topic: Clinical - Prescription Issue >> Feb 18, 2024 10:04 AM Travis F wrote: Reason for CRM: Patient is calling in because her Thyroid medication is on backorder at [facility] on [address] and she is going to be out in two weeks and the pharmacy doesn't know when they will get it in. Patient wants to know what her next steps would be to get the medicine.

## 2024-02-18 NOTE — Telephone Encounter (Unsigned)
 Copied from CRM #[id number]. Topic: Clinical - Medication Question >> Feb 18, 2024 10:02 AM Travis F wrote: Reason for CRM: Patient is calling in wanting to know if Dr. Vicci can call in some Zofran  for patient to help with her nausea.

## 2024-02-19 MED ORDER — ONDANSETRON HCL 4 MG PO TABS: 4.0000 mg | ORAL_TABLET | Freq: Every day | ORAL | 0 refills | Status: DC | PRN

## 2024-02-19 NOTE — Telephone Encounter (Signed)
 I can send the Tapazole  prescription to a different pharmacy or to our pharmacy down stairs. Where would she prefer I send the prescription to?

## 2024-02-19 NOTE — Telephone Encounter (Signed)
 RF sent on Zofran .

## 2024-02-19 NOTE — Addendum Note (Signed)
 Addended by: VICCI SOBER B on: 02/19/2024 06:16 PM   Modules accepted: Orders

## 2024-02-20 NOTE — Telephone Encounter (Signed)
 Refill has been sent with refills  02/11/2024.

## 2024-02-20 NOTE — Telephone Encounter (Signed)
 Patient identified by name and date of birth.  Patient aware of response from Dr. Vicci and voiced understanding.  Patient was requesting a new medication be sent in for her and I did reiterate Dr. Ferdie message.

## 2024-02-20 NOTE — Telephone Encounter (Signed)
 Duplicate

## 2024-02-20 NOTE — Telephone Encounter (Signed)
 Copied from CRM [id number]. Topic: Clinical - Prescription Issue >> Feb 20, 2024  8:44 AM Wess RAMAN wrote:  Reason for CRM: Patient states methimazole  (TAPAZOLE ) 5 MG tablet is still on back order. She only has a week and 5 days of medication left.   Callback #: [id number]

## 2024-03-02 DIAGNOSIS — E0590 Thyrotoxicosis, unspecified without thyrotoxic crisis or storm: Secondary | ICD-10-CM | POA: Diagnosis not present

## 2024-03-06 ENCOUNTER — Telehealth: Payer: Self-pay | Admitting: Internal Medicine

## 2024-03-06 NOTE — Telephone Encounter (Signed)
 Confirmed appt for 8/18

## 2024-03-09 ENCOUNTER — Ambulatory Visit: Attending: Internal Medicine | Admitting: Internal Medicine

## 2024-03-09 ENCOUNTER — Telehealth: Payer: Self-pay | Admitting: Internal Medicine

## 2024-03-09 ENCOUNTER — Encounter: Payer: Self-pay | Admitting: Internal Medicine

## 2024-03-09 VITALS — BP 125/70 | HR 64 | Temp 98.2°F | Ht 65.0 in | Wt 153.0 lb

## 2024-03-09 DIAGNOSIS — H9202 Otalgia, left ear: Secondary | ICD-10-CM | POA: Diagnosis not present

## 2024-03-09 DIAGNOSIS — R519 Headache, unspecified: Secondary | ICD-10-CM | POA: Diagnosis not present

## 2024-03-09 DIAGNOSIS — I10 Essential (primary) hypertension: Secondary | ICD-10-CM | POA: Diagnosis not present

## 2024-03-09 DIAGNOSIS — H43392 Other vitreous opacities, left eye: Secondary | ICD-10-CM

## 2024-03-09 DIAGNOSIS — Z2821 Immunization not carried out because of patient refusal: Secondary | ICD-10-CM | POA: Diagnosis not present

## 2024-03-09 DIAGNOSIS — Z79899 Other long term (current) drug therapy: Secondary | ICD-10-CM

## 2024-03-09 DIAGNOSIS — K8020 Calculus of gallbladder without cholecystitis without obstruction: Secondary | ICD-10-CM

## 2024-03-09 DIAGNOSIS — Z87891 Personal history of nicotine dependence: Secondary | ICD-10-CM | POA: Diagnosis not present

## 2024-03-09 DIAGNOSIS — E0590 Thyrotoxicosis, unspecified without thyrotoxic crisis or storm: Secondary | ICD-10-CM

## 2024-03-09 MED ORDER — AMOXICILLIN 500 MG PO CAPS
500.0000 mg | ORAL_CAPSULE | Freq: Three times a day (TID) | ORAL | 0 refills | Status: AC
Start: 2024-03-09 — End: 2024-03-16

## 2024-03-09 NOTE — Progress Notes (Signed)
 Patient ID: Shannon [name], female    DOB: Jan 17, 1956  MRN: [id number]  CC: Hypertension (HTN f/u. Suellen gallbladder sx for 03/28/14/25)   Subjective: Shannon [name] is a 68 y.o. female who presents for chronic ds management. Her concerns today include:  Pt with hx of HTN, former smoker, HL(Lipitor upset stomach; decline other statin), IBS (on Celexa  for this with overlay of anxiety), GERD, seasonal allergies.    Discussed the use of AI scribe software for clinical note transcription with the patient, who gave verbal consent to proceed.  History of Present Illness   Shannon [name] is a 68 year old female who presents for follow-up and pre-operative evaluation for gallbladder surgery.  She is scheduled for gallbladder removal surgery on September 15th.  Reports having surgery under general anesthesia before and did not have any issues waking up from anesthesia.  Her hypertension is managed with valsartan  40 mg and hydrochlorothiazide  25 mg daily, which she takes consistently. She does not monitor her blood pressure at home but reports no issues with it. She limits her salt intake.  Denies any chest pains or shortness of breath.  She is able to walk 2 blocks at a brisk pace without issues of chest pains or shortness of breath.  She is also able to walk up a flight of stairs without difficulty.  She experiences mild daily headaches, which she reports may be related to her thyroid medication or her neck issues. The headaches are located at the back of her neck, and sometimes move forward on the scalp.  Started after being placed on Tapazole  for overactive thyroid.  She has since seen the endocrinologist and was changed to PTU.  However she reports that this also causes headaches for her.  She also has history of degenerative disc in the cervical spine. She takes Tylenol or ibuprofen for relief and uses prickly pear cactus for her arthritis, which she finds effective.  Reports almost  immediate relief from the neck pain and headache when she takes prickly pair cactus.  Hyperthyroidism: Followed by [facility] (Meagan Younts). Seen 1 wk ago and had thyroid hormone level check. She does not recall the level but states she was told that it is improving. She was switched from methimazole  to propylthiouracil 50 mg daily after experiencing nausea and headaches with the previous medication. She has been on the new medication for one week and reports a slight headache but less nausea.    She experienced a floater in her left eye several months ago, which has mostly resolved. She has not yet seen an eye doctor but plans to do so after her surgery.  She reports a mild earache in her left ear for a couple of weeks, with no drainage. Requesting ear drops.  HM: Advised to get flu shot next month once it becomes available.  Patient states she does not plan to.  She also declines shingles vaccine.  Patient Active Problem List   Diagnosis Date Noted   History of genital warts 04/04/2021   Mixed hyperlipidemia 06/29/2019   Screening breast examination 01/27/2019   Seasonal allergies 11/11/2017   Plantar fascial fibromatosis of both feet 05/20/2017   Irritable bowel syndrome without diarrhea 03/11/2017   Gastroesophageal reflux disease without esophagitis 03/11/2017   Postmenopausal postcoital bleeding 08/27/2016   HTN (hypertension), benign 04/02/2016   Vitamin D  deficiency 10/10/2015   Chronic knee pain 10/11/2014   Eczema 10/11/2014   History of right knee surgery 10/11/2014  Current Outpatient Medications on File Prior to Visit  Medication Sig Dispense Refill   acetic acid -hydrocortisone  (VOSOL -HC) OTIC solution Use 4 drops in both ears every night for 2 weeks as needed. 10 mL 0   Ascorbic Acid (VITAMIN C) 1000 MG tablet Take 1,000 mg by mouth once a week.     Black Currant Seed Oil 500 MG CAPS Take 500 mg by mouth daily.     famotidine  (PEPCID ) 20 MG tablet TAKE 1  TABLET BY MOUTH TWICE A DAY 180 tablet 1   fluticasone  (FLONASE ) 50 MCG/ACT nasal spray SPRAY 2 SPRAYS INTO EACH NOSTRIL EVERY DAY 48 mL 2   hydrochlorothiazide  (HYDRODIURIL ) 25 MG tablet Take 1 tablet (25 mg total) by mouth daily. 90 tablet 3   loratadine  (CLARITIN ) 10 MG tablet Take 1 tablet (10 mg total) by mouth daily. 60 tablet 1   Omega-3 Fatty Acids (FISH OIL PO) Take 1 tablet by mouth daily.     ondansetron  (ZOFRAN ) 4 MG tablet Take 1 tablet (4 mg total) by mouth daily as needed for nausea or vomiting. 20 tablet 0   Turmeric (QC TUMERIC COMPLEX) 500 MG CAPS Take 500 mg by mouth daily.     valsartan  (DIOVAN ) 40 MG tablet Take 1 tablet (40 mg total) by mouth daily. 90 tablet 1   diclofenac  Sodium (VOLTAREN ) 1 % GEL Apply 2 grams topically 2 (two) times daily as needed. (Patient not taking: Reported on 03/09/2024) 100 g 1   No current facility-administered medications on file prior to visit.    Allergies  Allergen Reactions   Lipitor [Atorvastatin ] Other (See Comments)    Upset her stomach.    Social History   Socioeconomic History   Marital status: Single    Spouse name: Not on file   Number of children: 2   Years of education: Not on file   Highest education level: Some college, no degree  Occupational History   Not on file  Tobacco Use   Smoking status: Former    Current packs/day: 0.00    Average packs/day: 0.3 packs/day for 20.0 years (5.0 ttl pk-yrs)    Types: Cigarettes    Start date: 08/01/1995    Quit date: 08/01/2015    Years since quitting: 8.6   Smokeless tobacco: Never  Vaping Use   Vaping status: Never Used  Substance and Sexual Activity   Alcohol use: Not Currently    Alcohol/week: 2.0 - 3.0 standard drinks of alcohol    Types: 2 - 3 Cans of beer per week    Comment: 2-3 drinks beer weekend   Drug use: No   Sexual activity: Not Currently    Birth control/protection: Surgical  Other Topics Concern   Not on file  Social History Narrative   Not on file    Social Drivers of Health   Financial Resource Strain: Medium Risk (12/09/2023)   Overall Financial Resource Strain (CARDIA)    Difficulty of Paying Living Expenses: Somewhat hard  Food Insecurity: Food Insecurity Present (12/09/2023)   Hunger Vital Sign    Worried About Running Out of Food in the Last Year: Sometimes true    Ran Out of Food in the Last Year: Never true  Transportation Needs: No Transportation Needs (12/09/2023)   PRAPARE - Administrator, Civil Service (Medical): No    Lack of Transportation (Non-Medical): No  Physical Activity: Sufficiently Active (12/09/2023)   Exercise Vital Sign    Days of Exercise per Week: 5 days  Minutes of Exercise per Session: 30 min  Stress: No Stress Concern Present (12/09/2023)   [employer] of Occupational Health - Occupational Stress Questionnaire    Feeling of Stress : Only a little  Social Connections: Socially Isolated (12/09/2023)   Social Connection and Isolation Panel    Frequency of Communication with Friends and Family: Once a week    Frequency of Social Gatherings with Friends and Family: Once a week    Attends Religious Services: Never    Database administrator or Organizations: No    Attends Banker Meetings: Never    Marital Status: Divorced  Catering manager Violence: Not At Risk (12/09/2023)   Humiliation, Afraid, Rape, and Kick questionnaire    Fear of Current or Ex-Partner: No    Emotionally Abused: No    Physically Abused: No    Sexually Abused: No    Family History  Problem Relation Age of Onset   Colon cancer Father 51   Cancer Father    Cancer Maternal Grandmother    Breast cancer Cousin     Past Surgical History:  Procedure Laterality Date   ABDOMINAL HYSTERECTOMY     ANTERIOR CRUCIATE LIGAMENT REPAIR  1995   BREAST EXCISIONAL BIOPSY Bilateral    benign   CESAREAN SECTION     x2   COLONOSCOPY  09/05/2015   Dr.Jacobs   COLONOSCOPY  05/2010   PARTIAL HYSTERECTOMY      TAH   POLYPECTOMY      ROS: Review of Systems Negative except as stated above  PHYSICAL EXAM: BP 125/70 (BP Location: Left Arm, Patient Position: Sitting, Cuff Size: Normal)   Pulse 64   Temp 98.2 F (36.8 C) (Oral)   Ht 5' 5 (1.651 m)   Wt 153 lb (69.4 kg)   SpO2 96%   BMI 25.46 kg/m   Physical Exam  General appearance - alert, well appearing, and in no distress Mental status - normal mood, behavior, speech, dress, motor activity, and thought processes Ears -both ear canal has small amount of wax at the opening obscuring full view of the canal and tympanic membrane.  No preauricular lymphadenopathy.  No discomfort on palpation of the pinna on either side Eyes -pink conjunctiva Neck - supple, no significant adenopathy Lymphatics -no cervical or axillary lymphadenopathy Chest - clear to auscultation, no wheezes, rales or rhonchi, symmetric air entry Heart - normal rate, regular rhythm, normal S1, S2, no murmurs, rubs, clicks or gallops Extremities - peripheral pulses normal, no pedal edema, no clubbing or cyanosis Neuro: Gait is stable.  Cranial nerves are grossly intact.  Power 5/5 throughout.     Latest Ref Rng & Units 02/11/2023    4:20 PM 12/04/2021    2:27 PM 02/01/2020    4:30 PM  CMP  Glucose 70 - 99 mg/dL 880  80  894   BUN 8 - 27 mg/dL 11  9  11    Creatinine 0.57 - 1.00 mg/dL 9.20  9.25  9.18   Sodium 134 - 144 mmol/L 141  144  145   Potassium 3.5 - 5.2 mmol/L 3.6  4.1  3.6   Chloride 96 - 106 mmol/L 100  104  103   CO2 20 - 29 mmol/L 25  26  28    Calcium  8.7 - 10.3 mg/dL 9.8  9.8  9.6   Total Protein 6.0 - 8.5 g/dL 6.5  6.9  7.1   Total Bilirubin 0.0 - 1.2 mg/dL 0.3  0.3  0.4  Alkaline Phos 44 - 121 IU/L 89  77  80   AST 0 - 40 IU/L 19  17  24    ALT 0 - 32 IU/L 12  18  28     Lipid Panel     Component Value Date/Time   CHOL 208 (H) 02/11/2023 1620   TRIG 166 (H) 02/11/2023 1620   HDL 48 02/11/2023 1620   CHOLHDL 4.3 02/11/2023 1620   CHOLHDL 3.7 Ratio  09/10/2008 7962   VLDL 14 09/10/2008 2037   LDLCALC 130 (H) 02/11/2023 1620    CBC    Component Value Date/Time   WBC 6.6 02/11/2023 1620   WBC 4.9 10/12/2015 0931   RBC 4.81 02/11/2023 1620   RBC 4.67 10/12/2015 0931   HGB 13.3 02/11/2023 1620   HCT 40.9 02/11/2023 1620   PLT 211 02/11/2023 1620   MCV 85 02/11/2023 1620   MCH 27.7 02/11/2023 1620   MCH 28.5 10/12/2015 0931   MCHC 32.5 02/11/2023 1620   MCHC 33.8 10/12/2015 0931   RDW 12.7 02/11/2023 1620   LYMPHSABS 1.9 10/12/2015 0931   MONOABS 0.6 10/12/2015 0931   EOSABS 0.1 10/12/2015 0931   BASOSABS 0.0 10/12/2015 0931    ASSESSMENT AND PLAN: 1. Essential hypertension (Primary) At goal.  Patient to continue HCTZ and valsartan .  She is medically stable to proceed with having cholecystectomy.  However I would like to see her recent thyroid panel test.  She plans to stop by her endocrinologist office that is in this building today and get a copy and bring it for me. - CBC - Comprehensive metabolic panel with GFR - Lipid panel  2. Gall bladder stones See #1 above.  3. Hyperthyroidism Recently switched from Tapazole  to PTU by her endocrinologist.  Patient will bring copies of her most recent thyroid hormone panel test  4. Daily headache Patient with known degenerative disc in the neck.  She also relates her headache to her thyroid medication.  Encouraged her to speak with the endocrinologist about it.  5. Floaters, left - Ambulatory referral to Ophthalmology  6. Ear pain, left - amoxicillin  (AMOXIL ) 500 MG capsule; Take 1 capsule (500 mg total) by mouth 3 (three) times daily for 7 days.  Dispense: 21 capsule; Refill: 0  7. Influenza vaccination declined   8. Herpes zoster vaccination declined    Patient was given the opportunity to ask questions.  Patient verbalized understanding of the plan and was able to repeat key elements of the plan.   This documentation was completed using Public librarian.  Any transcriptional errors are unintentional.  Orders Placed This Encounter  Procedures   CBC   Comprehensive metabolic panel with GFR   Lipid panel   Ambulatory referral to Ophthalmology     Requested Prescriptions   Signed Prescriptions Disp Refills   amoxicillin  (AMOXIL ) 500 MG capsule 21 capsule 0    Sig: Take 1 capsule (500 mg total) by mouth 3 (three) times daily for 7 days.    Return in about 4 months (around 07/09/2024).  Barnie Louder, MD, FACP

## 2024-03-09 NOTE — Patient Instructions (Signed)
  VISIT SUMMARY: Today, you came in for a follow-up and pre-operative evaluation for your upcoming gallbladder surgery. We discussed your current medications, ongoing health issues, and any new symptoms you are experiencing.  YOUR PLAN: -CHOLELITHIASIS: Cholelithiasis means you have gallstones, and you are scheduled for gallbladder removal surgery on September 15th. We need to ensure your thyroid levels are at an acceptable range before the surgery.  -HYPERTHYROIDISM: Hyperthyroidism is when your thyroid gland is overactive. You are currently taking propylthiouracil 50 mg daily. We will sign a release to obtain your recent thyroid labs from your endocrinologist and discuss the mild headaches you are experiencing as a side effect. We need to ensure your thyroid levels are at an acceptable range before your surgery.  -HEADACHE ASSOCIATED WITH HYPERTHYROIDISM AND CERVICAL DEGENERATIVE DISC DISEASE: You have mild daily headaches that may be related to your thyroid medication or neck issues. We will discuss headache management with your endocrinologist. Continue using  Tylenol as needed for relief.  -ESSENTIAL HYPERTENSION: Essential hypertension is high blood pressure with no identifiable cause. Your blood pressure is well-controlled with valsartan  40 mg and hydrochlorothiazide  25 mg daily. Continue taking these medications and limit your salt intake.  -LEFT EAR PAIN: You have persistent left ear pain with no visible signs of infection. We will prescribe amoxicillin  for the ear pain and advise you to wear a face mask at work to prevent infections.  -LEFT EYE FLOATER: A floater is a small speck or cloud moving in your field of vision. Your floater has improved but not resolved. We will submit a referral to an eye doctor for further evaluation.  INSTRUCTIONS: 1. Ensure your thyroid levels are at an acceptable range before your gallbladder surgery on September 15th. 2. Sign a release to obtain recent  thyroid labs from your endocrinologist. 3. Discuss headache management with your endocrinologist. 4. Continue using prickly pear cactus and Tylenol as needed for pain relief. 5. Continue taking valsartan  40 mg and hydrochlorothiazide  25 mg daily for blood pressure control and limit your salt intake. 6. Take the prescribed amoxicillin  for your left ear pain and wear a face mask at work to prevent infections. 7. Follow up with an eye doctor for evaluation of your left eye floater.                      Contains text generated by Abridge.                                 Contains text generated by Abridge.

## 2024-03-09 NOTE — Telephone Encounter (Signed)
 Patient dropped off recent thyroid labs from endocrinologist,results have been placed in providers box.

## 2024-03-10 LAB — COMPREHENSIVE METABOLIC PANEL WITH GFR
ALT: 15 IU/L (ref 0–32)
AST: 22 IU/L (ref 0–40)
Albumin: 4.2 g/dL (ref 3.9–4.9)
Alkaline Phosphatase: 119 IU/L (ref 44–121)
BUN/Creatinine Ratio: 16 (ref 12–28)
BUN: 12 mg/dL (ref 8–27)
Bilirubin Total: 0.4 mg/dL (ref 0.0–1.2)
CO2: 27 mmol/L (ref 20–29)
Calcium: 10.1 mg/dL (ref 8.7–10.3)
Chloride: 103 mmol/L (ref 96–106)
Creatinine, Ser: 0.75 mg/dL (ref 0.57–1.00)
Globulin, Total: 2.4 g/dL (ref 1.5–4.5)
Glucose: 95 mg/dL (ref 70–99)
Potassium: 4.3 mmol/L (ref 3.5–5.2)
Sodium: 143 mmol/L (ref 134–144)
Total Protein: 6.6 g/dL (ref 6.0–8.5)
eGFR: 88 mL/min/1.73 (ref >59)

## 2024-03-10 LAB — CBC
Hematocrit: 41 % (ref 34.0–46.6)
Hemoglobin: 13.1 g/dL (ref 11.1–15.9)
MCH: 27.5 pg (ref 26.6–33.0)
MCHC: 32 g/dL (ref 31.5–35.7)
MCV: 86 fL (ref 79–97)
Platelets: 217 x10E3/uL (ref 150–450)
RBC: 4.76 x10E6/uL (ref 3.77–5.28)
RDW: 14.7 % (ref 11.7–15.4)
WBC: 7.1 x10E3/uL (ref 3.4–10.8)

## 2024-03-10 LAB — LIPID PANEL
Chol/HDL Ratio: 3.2 ratio (ref 0.0–4.4)
Cholesterol, Total: 183 mg/dL (ref 100–199)
HDL: 57 mg/dL (ref >39)
LDL Chol Calc (NIH): 107 mg/dL — ABNORMAL HIGH (ref 0–99)
Triglycerides: 107 mg/dL (ref 0–149)
VLDL Cholesterol Cal: 19 mg/dL (ref 5–40)

## 2024-03-11 ENCOUNTER — Telehealth: Payer: Self-pay | Admitting: Internal Medicine

## 2024-03-11 ENCOUNTER — Ambulatory Visit: Payer: Self-pay | Admitting: Internal Medicine

## 2024-03-11 NOTE — Telephone Encounter (Signed)
 I received lab results from [facility] done 8 05/2024.  Free T3 was slightly elevated at 3.9, free T4 was within normal range at 0.74 and TSH was less than 0.01.  I called [facility] and left a message for [employer] requesting a callback inquiring whether her current levels are okay for us  to proceed with gallbladder surgery on the 15th of next month

## 2024-03-11 NOTE — Telephone Encounter (Signed)
 She should direct this question to her endocrinologist.

## 2024-03-11 NOTE — Telephone Encounter (Signed)
 Copied from CRM [id number]. Topic: Clinical - Medication Question >> Mar 11, 2024 10:43 AM Tinnie BROCKS wrote:  Reason for CRM: Pt has questions about medications. She was recently told to take Propylthiouracil 50mg  by her thyroid doctor. She also was on methimazole  but it made her feel sick so then bionphirna (?) was prescribed. She wants to know if propylthiouracil will make her feel sick.

## 2024-03-11 NOTE — Telephone Encounter (Signed)
 Called & spoke to the patient. Verified name & DOB. Informed to reach out to endocrinologist for those specific medication questions. Patient expressed verbal understanding.

## 2024-03-15 NOTE — Telephone Encounter (Signed)
 Addendum 03/15/2024: I heard back from [facility] on 03/12/24. She reports that current free T and T4 levels are good, TSH level tends to lag behind in hyperthyroidism. May consider repeating thyroid panel 1 wk prior to surgery.

## 2024-03-23 ENCOUNTER — Other Ambulatory Visit: Payer: Self-pay | Admitting: Internal Medicine

## 2024-03-27 ENCOUNTER — Encounter: Payer: Self-pay | Admitting: Family Medicine

## 2024-03-27 DIAGNOSIS — R7989 Other specified abnormal findings of blood chemistry: Secondary | ICD-10-CM | POA: Insufficient documentation

## 2024-04-03 NOTE — Progress Notes (Signed)
 INSTRUCTIONS ONLY  Anesthesia review: N  Patient verbally denies any shortness of breath, fever, cough and chest pain during phone call   -------------  SDW INSTRUCTIONS given:  Your procedure is scheduled on Monday, Sept 15th.  Report to [facility] Main Entrance A at 0530 A.M., and check in at the Admitting office.  Call this number if you have problems the morning of surgery:  [phone]   Remember:  Do not eat or drink after midnight the night before your surgery   Take these medicines the morning of surgery with A SIP OF WATER  propylthiouracil (PTU)  famotidine  (PEPCID )-if needed fluticasone  (FLONASE )-if needed  As of today, STOP taking any Aspirin (unless otherwise instructed by your surgeon) Aleve, Naproxen, Ibuprofen, Motrin, Advil, Goody's, BC's, all herbal medications, fish oil, and all vitamins.                      Do not wear jewelry, make up, or nail polish            Do not wear lotions, powders, perfumes/colognes, or deodorant.            Do not shave 48 hours prior to surgery.  Men may shave face and neck.            Do not bring valuables to the hospital.            [facility] is not responsible for any belongings or valuables.  Do NOT Smoke (Tobacco/Vaping) 24 hours prior to your procedure If you use a CPAP at night, you may bring all equipment for your overnight stay.   Contacts, glasses, dentures or bridgework may not be worn into surgery.      For patients admitted to the hospital, discharge time will be determined by your treatment team.   Patients discharged the day of surgery will not be allowed to drive home, and someone needs to stay with them for 24 hours.    Special instructions:   [place]- Preparing For Surgery  Before surgery, you can play an important role. Because skin is not sterile, your skin needs to be as free of germs as possible. You can reduce the number of germs on your skin by washing with CHG (chlorahexidine gluconate)  Soap before surgery.  CHG is an antiseptic cleaner which kills germs and bonds with the skin to continue killing germs even after washing.    Oral Hygiene is also important to reduce your risk of infection.  Remember - BRUSH YOUR TEETH THE MORNING OF SURGERY WITH YOUR REGULAR TOOTHPASTE  Please do not use if you have an allergy to CHG or antibacterial soaps. If your skin becomes reddened/irritated stop using the CHG.  Do not shave (including legs and underarms) for at least 48 hours prior to first CHG shower. It is OK to shave your face.  Please follow these instructions carefully.   Shower the NIGHT BEFORE SURGERY and the MORNING OF SURGERY with DIAL Soap.   Pat yourself dry with a CLEAN TOWEL.  Wear CLEAN PAJAMAS to bed the night before surgery  Place CLEAN SHEETS on your bed the night of your first shower and DO NOT SLEEP WITH PETS.   Day of Surgery: Please shower morning of surgery  Wear Clean/Comfortable clothing the morning of surgery Do not apply any deodorants/lotions.   Remember to brush your teeth WITH YOUR REGULAR TOOTHPASTE.   Questions were answered. Patient verbalized understanding of instructions.

## 2024-04-05 NOTE — Anesthesia Preprocedure Evaluation (Addendum)
 Anesthesia Evaluation  Patient identified by MRN, date of birth, ID band Patient awake    Reviewed: Allergy & Precautions, NPO status , Patient's Chart, lab work & pertinent test results  History of Anesthesia Complications Negative for: history of anesthetic complications  Airway Mallampati: II  TM Distance: <3 FB Neck ROM: Full    Dental  (+) Dental Advisory Given   Pulmonary neg pulmonary ROS, former smoker   breath sounds clear to auscultation       Cardiovascular hypertension,  Rhythm:Regular Rate:Normal     Neuro/Psych    GI/Hepatic ,GERD  Poorly Controlled and Medicated,,  Endo/Other   Hyperthyroidism (Medically managed on PTU)   Renal/GU      Musculoskeletal   Abdominal   Peds  Hematology   Anesthesia Other Findings   Reproductive/Obstetrics                              Anesthesia Physical Anesthesia Plan  ASA: 3  Anesthesia Plan: General   Post-op Pain Management: Ofirmev  IV (intra-op)*   Induction: Intravenous  PONV Risk Score and Plan: 2  Airway Management Planned: Oral ETT  Additional Equipment:   Intra-op Plan:   Post-operative Plan:   Informed Consent:      Dental advisory given  Plan Discussed with:   Anesthesia Plan Comments: (Recent hyperthyroid diagnosis - medically managed on PTU with unchanged dose. Per chart, surgically cleared by endocrinologist on 03/16/24. No change in symptoms. )         Anesthesia Quick Evaluation

## 2024-04-06 ENCOUNTER — Ambulatory Visit: Admitting: Internal Medicine

## 2024-04-06 ENCOUNTER — Other Ambulatory Visit: Payer: Self-pay

## 2024-04-06 ENCOUNTER — Encounter (HOSPITAL_COMMUNITY)
Admission: RE | Disposition: A | Discharge status: Home / Self Care | Payer: Self-pay | Source: Home / Self Care | Attending: General Surgery

## 2024-04-06 ENCOUNTER — Ambulatory Visit (HOSPITAL_COMMUNITY): Payer: Self-pay

## 2024-04-06 ENCOUNTER — Encounter (HOSPITAL_COMMUNITY): Payer: Self-pay | Admitting: General Surgery

## 2024-04-06 ENCOUNTER — Ambulatory Visit (HOSPITAL_COMMUNITY)
Admission: RE | Admit: 2024-04-06 | Discharge: 2024-04-06 | Disposition: A | Discharge status: Home / Self Care | Attending: General Surgery | Admitting: General Surgery

## 2024-04-06 ENCOUNTER — Ambulatory Visit (HOSPITAL_BASED_OUTPATIENT_CLINIC_OR_DEPARTMENT_OTHER): Payer: Self-pay

## 2024-04-06 DIAGNOSIS — K8011 Calculus of gallbladder with chronic cholecystitis with obstruction: Secondary | ICD-10-CM | POA: Diagnosis not present

## 2024-04-06 DIAGNOSIS — K8010 Calculus of gallbladder with chronic cholecystitis without obstruction: Secondary | ICD-10-CM | POA: Insufficient documentation

## 2024-04-06 DIAGNOSIS — I10 Essential (primary) hypertension: Secondary | ICD-10-CM

## 2024-04-06 DIAGNOSIS — E039 Hypothyroidism, unspecified: Secondary | ICD-10-CM

## 2024-04-06 DIAGNOSIS — E0590 Thyrotoxicosis, unspecified without thyrotoxic crisis or storm: Secondary | ICD-10-CM | POA: Diagnosis not present

## 2024-04-06 DIAGNOSIS — Z87891 Personal history of nicotine dependence: Secondary | ICD-10-CM | POA: Insufficient documentation

## 2024-04-06 DIAGNOSIS — K838 Other specified diseases of biliary tract: Secondary | ICD-10-CM | POA: Diagnosis present

## 2024-04-06 DIAGNOSIS — K219 Gastro-esophageal reflux disease without esophagitis: Secondary | ICD-10-CM | POA: Insufficient documentation

## 2024-04-06 DIAGNOSIS — K8020 Calculus of gallbladder without cholecystitis without obstruction: Secondary | ICD-10-CM | POA: Diagnosis present

## 2024-04-06 HISTORY — PX: CHOLECYSTECTOMY: SHX55

## 2024-04-06 SURGERY — LAPAROSCOPIC CHOLECYSTECTOMY
Anesthesia: General | Site: Abdomen

## 2024-04-06 MED ORDER — ACETAMINOPHEN 10 MG/ML IV SOLN
INTRAVENOUS | Status: AC
  Filled 2024-04-06: qty 100

## 2024-04-06 MED ORDER — CELECOXIB 200 MG PO CAPS: 200.0000 mg | ORAL_CAPSULE | ORAL | Status: DC

## 2024-04-06 MED ORDER — KETOROLAC TROMETHAMINE 15 MG/ML IJ SOLN
INTRAMUSCULAR | Status: AC
  Filled 2024-04-06: qty 1

## 2024-04-06 MED ORDER — SUCCINYLCHOLINE CHLORIDE 200 MG/10ML IV SOSY
PREFILLED_SYRINGE | INTRAVENOUS | Status: DC | PRN
  Administered 2024-04-06: 100 mg via INTRAVENOUS

## 2024-04-06 MED ORDER — ONDANSETRON HCL 4 MG/2ML IJ SOLN
INTRAMUSCULAR | Status: DC | PRN
  Administered 2024-04-06: 4 mg via INTRAVENOUS

## 2024-04-06 MED ORDER — AMISULPRIDE (ANTIEMETIC) 5 MG/2ML IV SOLN
10.0000 mg | Freq: Once | INTRAVENOUS | Status: AC
  Administered 2024-04-06: 10 mg via INTRAVENOUS

## 2024-04-06 MED ORDER — PHENYLEPHRINE 80 MCG/ML (10ML) SYRINGE FOR IV PUSH (FOR BLOOD PRESSURE SUPPORT)
PREFILLED_SYRINGE | INTRAVENOUS | Status: AC
  Filled 2024-04-06: qty 10

## 2024-04-06 MED ORDER — ROCURONIUM BROMIDE 10 MG/ML (PF) SYRINGE
PREFILLED_SYRINGE | INTRAVENOUS | Status: AC
  Filled 2024-04-06: qty 10

## 2024-04-06 MED ORDER — 0.9 % SODIUM CHLORIDE (POUR BTL) OPTIME
TOPICAL | Status: DC | PRN
  Administered 2024-04-06: 1000 mL

## 2024-04-06 MED ORDER — BUPIVACAINE-EPINEPHRINE (PF) 0.25% -1:200000 IJ SOLN
INTRAMUSCULAR | Status: AC
  Filled 2024-04-06: qty 30

## 2024-04-06 MED ORDER — ACETAMINOPHEN 500 MG PO TABS: 1000.0000 mg | ORAL_TABLET | ORAL | Status: DC

## 2024-04-06 MED ORDER — ONDANSETRON HCL 4 MG/2ML IJ SOLN
INTRAMUSCULAR | Status: AC
  Filled 2024-04-06: qty 2

## 2024-04-06 MED ORDER — KETOROLAC TROMETHAMINE 15 MG/ML IJ SOLN
15.0000 mg | Freq: Once | INTRAMUSCULAR | Status: AC
Start: 2024-04-06 — End: 2024-04-06
  Administered 2024-04-06: 15 mg via INTRAVENOUS

## 2024-04-06 MED ORDER — EPHEDRINE 5 MG/ML INJ
INTRAVENOUS | Status: AC
Start: 2024-04-06 — End: 2024-04-06
  Filled 2024-04-06: qty 5

## 2024-04-06 MED ORDER — CHLORHEXIDINE GLUCONATE 0.12 % MT SOLN
15.0000 mL | Freq: Once | OROMUCOSAL | Status: AC
  Administered 2024-04-06: 15 mL via OROMUCOSAL
  Filled 2024-04-06: qty 15

## 2024-04-06 MED ORDER — PROPOFOL 10 MG/ML IV BOLUS
INTRAVENOUS | Status: AC
  Filled 2024-04-06: qty 20

## 2024-04-06 MED ORDER — AMISULPRIDE (ANTIEMETIC) 5 MG/2ML IV SOLN
INTRAVENOUS | Status: AC
  Filled 2024-04-06: qty 4

## 2024-04-06 MED ORDER — CHLORHEXIDINE GLUCONATE CLOTH 2 % EX PADS: 6.0000 | MEDICATED_PAD | Freq: Once | CUTANEOUS | Status: DC

## 2024-04-06 MED ORDER — PROPOFOL 10 MG/ML IV BOLUS
INTRAVENOUS | Status: DC | PRN
  Administered 2024-04-06: 110 mg via INTRAVENOUS

## 2024-04-06 MED ORDER — FENTANYL CITRATE (PF) 250 MCG/5ML IJ SOLN
INTRAMUSCULAR | Status: AC
  Filled 2024-04-06: qty 5

## 2024-04-06 MED ORDER — LIDOCAINE 2% (20 MG/ML) 5 ML SYRINGE
INTRAMUSCULAR | Status: DC | PRN
  Administered 2024-04-06: 100 mg via INTRAVENOUS

## 2024-04-06 MED ORDER — SUGAMMADEX SODIUM 200 MG/2ML IV SOLN
INTRAVENOUS | Status: DC | PRN
  Administered 2024-04-06: 200 mg via INTRAVENOUS

## 2024-04-06 MED ORDER — METHOCARBAMOL 500 MG PO TABS
ORAL_TABLET | ORAL | Status: AC
  Filled 2024-04-06: qty 1

## 2024-04-06 MED ORDER — BUPIVACAINE-EPINEPHRINE 0.25% -1:200000 IJ SOLN
INTRAMUSCULAR | Status: DC | PRN
  Administered 2024-04-06: 30 mL

## 2024-04-06 MED ORDER — ACETAMINOPHEN 10 MG/ML IV SOLN
1000.0000 mg | Freq: Once | INTRAVENOUS | Status: DC | PRN
  Administered 2024-04-06: 1000 mg via INTRAVENOUS

## 2024-04-06 MED ORDER — FENTANYL CITRATE (PF) 250 MCG/5ML IJ SOLN
INTRAMUSCULAR | Status: DC | PRN
  Administered 2024-04-06: 50 ug via INTRAVENOUS
  Administered 2024-04-06 (×2): 100 ug via INTRAVENOUS

## 2024-04-06 MED ORDER — PHENYLEPHRINE 80 MCG/ML (10ML) SYRINGE FOR IV PUSH (FOR BLOOD PRESSURE SUPPORT)
PREFILLED_SYRINGE | INTRAVENOUS | Status: DC | PRN
  Administered 2024-04-06: 80 ug via INTRAVENOUS

## 2024-04-06 MED ORDER — MIDAZOLAM HCL 2 MG/2ML IJ SOLN
INTRAMUSCULAR | Status: AC
  Filled 2024-04-06: qty 2

## 2024-04-06 MED ORDER — ROCURONIUM BROMIDE 10 MG/ML (PF) SYRINGE
PREFILLED_SYRINGE | INTRAVENOUS | Status: DC | PRN
  Administered 2024-04-06: 40 mg via INTRAVENOUS
  Administered 2024-04-06: 10 mg via INTRAVENOUS

## 2024-04-06 MED ORDER — FENTANYL CITRATE (PF) 100 MCG/2ML IJ SOLN
25.0000 ug | INTRAMUSCULAR | Status: DC | PRN
  Administered 2024-04-06: 50 ug via INTRAVENOUS
  Administered 2024-04-06 (×3): 25 ug via INTRAVENOUS

## 2024-04-06 MED ORDER — LIDOCAINE 2% (20 MG/ML) 5 ML SYRINGE
INTRAMUSCULAR | Status: AC
  Filled 2024-04-06: qty 5

## 2024-04-06 MED ORDER — GABAPENTIN 300 MG PO CAPS: 300.0000 mg | ORAL_CAPSULE | ORAL | Status: DC

## 2024-04-06 MED ORDER — LACTATED RINGERS IV SOLN: INTRAVENOUS | Status: DC

## 2024-04-06 MED ORDER — CEFAZOLIN SODIUM-DEXTROSE 2-4 GM/100ML-% IV SOLN
INTRAVENOUS | Status: AC
  Filled 2024-04-06: qty 100

## 2024-04-06 MED ORDER — FENTANYL CITRATE (PF) 100 MCG/2ML IJ SOLN
INTRAMUSCULAR | Status: AC
  Filled 2024-04-06: qty 2

## 2024-04-06 MED ORDER — MIDAZOLAM HCL 2 MG/2ML IJ SOLN
INTRAMUSCULAR | Status: DC | PRN
Start: 2024-04-06 — End: 2024-04-06
  Administered 2024-04-06: 2 mg via INTRAVENOUS

## 2024-04-06 MED ORDER — CEFAZOLIN SODIUM-DEXTROSE 2-4 GM/100ML-% IV SOLN
2.0000 g | INTRAVENOUS | Status: AC
  Administered 2024-04-06: 2 g via INTRAVENOUS

## 2024-04-06 MED ORDER — DEXAMETHASONE SODIUM PHOSPHATE 10 MG/ML IJ SOLN
INTRAMUSCULAR | Status: DC | PRN
  Administered 2024-04-06: 10 mg via INTRAVENOUS

## 2024-04-06 MED ORDER — ENOXAPARIN SODIUM 40 MG/0.4ML IJ SOSY: 40.0000 mg | PREFILLED_SYRINGE | Freq: Once | INTRAMUSCULAR | Status: DC

## 2024-04-06 MED ORDER — METHOCARBAMOL 500 MG PO TABS
500.0000 mg | ORAL_TABLET | Freq: Once | ORAL | Status: AC
  Administered 2024-04-06: 500 mg via ORAL

## 2024-04-06 MED ORDER — INDOCYANINE GREEN 25 MG IV SOLR
1.2500 mg | Freq: Once | INTRAVENOUS | Status: AC
Start: 2024-04-06 — End: 2024-04-06
  Administered 2024-04-06: 1.25 mg via INTRAVENOUS

## 2024-04-06 MED ORDER — SODIUM CHLORIDE 0.9 % IR SOLN
Status: DC | PRN
  Administered 2024-04-06: 1000 mL

## 2024-04-06 MED ORDER — ONDANSETRON HCL 4 MG/2ML IJ SOLN
4.0000 mg | Freq: Once | INTRAMUSCULAR | Status: AC | PRN
  Administered 2024-04-06: 4 mg via INTRAVENOUS

## 2024-04-06 MED ORDER — DEXAMETHASONE SODIUM PHOSPHATE 10 MG/ML IJ SOLN
INTRAMUSCULAR | Status: AC
  Filled 2024-04-06: qty 1

## 2024-04-06 MED ORDER — ORAL CARE MOUTH RINSE: 15.0000 mL | Freq: Once | OROMUCOSAL | Status: AC

## 2024-04-06 MED ORDER — OXYCODONE HCL 5 MG PO TABS: 5.0000 mg | ORAL_TABLET | ORAL | 0 refills | Status: DC | PRN

## 2024-04-06 SURGICAL SUPPLY — 43 items
BAG COUNTER SPONGE SURGICOUNT (BAG) ×1 IMPLANT
BLADE CLIPPER SURG (BLADE) IMPLANT
CANISTER SUCTION 3000ML PPV (SUCTIONS) ×1 IMPLANT
CHLORAPREP W/TINT 26 (MISCELLANEOUS) ×1 IMPLANT
CLIP APPLIE ROT 10 11.4 M/L (STAPLE) IMPLANT
CLIP LIGATING HEMO O LOK GREEN (MISCELLANEOUS) ×1 IMPLANT
CNTNR URN SCR LID CUP LEK RST (MISCELLANEOUS) ×1 IMPLANT
COVER SURGICAL LIGHT HANDLE (MISCELLANEOUS) ×1 IMPLANT
DERMABOND ADVANCED .7 DNX12 (GAUZE/BANDAGES/DRESSINGS) ×1 IMPLANT
DRAPE LAPAROSCOPIC ABDOMINAL (DRAPES) IMPLANT
ELECTRODE REM PT RTRN 9FT ADLT (ELECTROSURGICAL) ×1 IMPLANT
GLOVE BIOGEL PI MICRO STRL 6 (GLOVE) ×1 IMPLANT
GLOVE INDICATOR 6.5 STRL GRN (GLOVE) ×1 IMPLANT
GOWN STRL REUS W/ TWL LRG LVL3 (GOWN DISPOSABLE) ×3 IMPLANT
GRASPER SUT TROCAR 14GX15 (MISCELLANEOUS) ×1 IMPLANT
IRRIGATION SUCT STRKRFLW 2 WTP (MISCELLANEOUS) ×1 IMPLANT
KIT BASIN OR (CUSTOM PROCEDURE TRAY) ×1 IMPLANT
KIT IMAGING PINPOINTPAQ (MISCELLANEOUS) IMPLANT
KIT TURNOVER KIT B (KITS) ×1 IMPLANT
LHOOK LAP DISP 36CM (ELECTROSURGICAL) ×1 IMPLANT
NDL 22X1.5 STRL (OR ONLY) (MISCELLANEOUS) ×1 IMPLANT
NDL INSUFFLATION 14GA 120MM (NEEDLE) ×1 IMPLANT
NEEDLE 22X1.5 STRL (OR ONLY) (MISCELLANEOUS) ×1 IMPLANT
NEEDLE INSUFFLATION 14GA 120MM (NEEDLE) ×1 IMPLANT
NS IRRIG 1000ML POUR BTL (IV SOLUTION) ×1 IMPLANT
PAD ARMBOARD POSITIONER FOAM (MISCELLANEOUS) ×1 IMPLANT
PENCIL BUTTON HOLSTER BLD 10FT (ELECTRODE) ×1 IMPLANT
POUCH LAPAROSCOPIC INSTRUMENT (MISCELLANEOUS) ×1 IMPLANT
SCISSORS LAP 5X35 DISP (ENDOMECHANICALS) ×1 IMPLANT
SET TUBE SMOKE EVAC HIGH FLOW (TUBING) ×1 IMPLANT
SHEARS HARMONIC 36 ACE (MISCELLANEOUS) IMPLANT
SLEEVE Z-THREAD 5X100MM (TROCAR) ×2 IMPLANT
SPECIMEN JAR SMALL (MISCELLANEOUS) ×1 IMPLANT
SUT MNCRL AB 4-0 PS2 18 (SUTURE) ×1 IMPLANT
SUT VICRYL 0 UR6 27IN ABS (SUTURE) IMPLANT
SYSTEM BAG RETRIEVAL 10MM (BASKET) ×1 IMPLANT
TOWEL GREEN STERILE (TOWEL DISPOSABLE) IMPLANT
TOWEL GREEN STERILE FF (TOWEL DISPOSABLE) ×1 IMPLANT
TRAY LAPAROSCOPIC MC (CUSTOM PROCEDURE TRAY) ×1 IMPLANT
TROCAR Z THREAD OPTICAL 12X100 (TROCAR) ×1 IMPLANT
TROCAR Z-THREAD OPTICAL 5X100M (TROCAR) ×1 IMPLANT
WARMER LAPAROSCOPE (MISCELLANEOUS) ×1 IMPLANT
WATER STERILE IRR 1000ML POUR (IV SOLUTION) ×1 IMPLANT

## 2024-04-06 NOTE — Anesthesia Procedure Notes (Addendum)
 Procedure Name: Intubation Date/Time: 04/06/2024 8:38 AM  Performed by: Elby Raelene SAUNDERS, CRNAPre-anesthesia Checklist: Patient identified, Emergency Drugs available, Suction available and Patient being monitored Patient Re-evaluated:Patient Re-evaluated prior to induction Oxygen Delivery Method: Circle System Utilized Preoxygenation: Pre-oxygenation with 100% oxygen Induction Type: IV induction, Cricoid Pressure applied and Rapid sequence Ventilation: Mask ventilation without difficulty Laryngoscope Size: Miller and 2 Grade View: Grade II Tube type: Oral Tube size: 7.0 mm Number of attempts: 1 Airway Equipment and Method: Stylet and Oral airway Placement Confirmation: ETT inserted through vocal cords under direct vision, positive ETCO2 and breath sounds checked- equal and bilateral Secured at: 21 cm Tube secured with: Tape Dental Injury: Teeth and Oropharynx as per pre-operative assessment

## 2024-04-06 NOTE — Transfer of Care (Signed)
 Immediate Anesthesia Transfer of Care Note  Patient: Shannon [name]  Procedure(s) Performed: LAPAROSCOPIC CHOLECYSTECTOMY (Abdomen)  Patient Location: PACU  Anesthesia Type:General  Level of Consciousness: awake, alert , and oriented  Airway & Oxygen Therapy: Patient Spontanous Breathing and Patient connected to face mask oxygen  Post-op Assessment: Report given to RN and Post -op Vital signs reviewed and stable  Post vital signs: Reviewed and stable  Last Vitals:  Vitals Value Taken Time  BP    Temp    Pulse 82 04/06/24 09:14  Resp 20 04/06/24 09:14  SpO2 99 % 04/06/24 09:14  Vitals shown include unfiled device data.  Last Pain:  Vitals:   04/06/24 0706  TempSrc:   PainSc: 0-No pain         Complications: No notable events documented.

## 2024-04-06 NOTE — Anesthesia Postprocedure Evaluation (Signed)
 Anesthesia Post Note  Patient: Shannon [name]  Procedure(s) Performed: LAPAROSCOPIC CHOLECYSTECTOMY (Abdomen)     Patient location during evaluation: PACU Anesthesia Type: General Level of consciousness: awake and alert Pain management: pain level controlled Vital Signs Assessment: post-procedure vital signs reviewed and stable Respiratory status: spontaneous breathing, nonlabored ventilation and respiratory function stable Cardiovascular status: blood pressure returned to baseline and stable Postop Assessment: no apparent nausea or vomiting Anesthetic complications: no   No notable events documented.  Last Vitals:  Vitals:   04/06/24 1100 04/06/24 1115  BP: (!) 147/64 137/65  Pulse: 67 68  Resp: 19 16  Temp:  36.6 C  SpO2: 95% 98%    Last Pain:  Vitals:   04/06/24 1115  TempSrc:   PainSc: 4                  Lauraine KATHEE [place]

## 2024-04-06 NOTE — H&P (Signed)
 HPI  Shannon [name] is an 68 y.o. female who was seen in clinic on 01/08/24 for cholelithiasis  Patient has history of HTN, hiatal hernia, who recently saw her PCP for hair loss and GI symptoms. Has had nausea for 2 months, with an episode of emesis 2 weeks ago. She has taken pepto bismol, pepcid , and tums without relief. She also has heartburn that does wake her from sleep. She recently started Nexium and has been trying to avoid fried foods. She was then instructed to stop Nexium in order to get H Pylori breath test and has been taking famotidine  daily as prescribed by her PCP.  RUQ US  obtained per patient request that showed cholelithiasis with report of + Murphy sign. No gallbladder wall thickening or pericholecystic fluid. Mildly dilated CBD at 7mm.  Patient states her nausea is associated with abdominal pain diffusely, never localized. Feels like cramping sensation like she has to have a bowel movement. She has never had localized RUQ pain or epigastric pain. Nausea is daily and not associated with certain foods and not necessarily associated with PO intake. She complains of sweats, shaking, fatigue, hair loss that all started with onset of her nausea 2 months ago.    Since last seen in clinic her sweats/shaking/fatigue have improved. She was started on thyroid  medication.   10 point review of systems is negative except as listed above in HPI.  Objective  Past Medical History: Past Medical History:  Diagnosis Date   Arthritis    BV (bacterial vaginosis)    Hypertension     Past Surgical History: Past Surgical History:  Procedure Laterality Date   ABDOMINAL HYSTERECTOMY     ANTERIOR CRUCIATE LIGAMENT REPAIR  1995   BREAST EXCISIONAL BIOPSY Bilateral    benign   CESAREAN SECTION     x2   COLONOSCOPY  09/05/2015   Dr.Jacobs   COLONOSCOPY  05/2010   PARTIAL HYSTERECTOMY     TAH   POLYPECTOMY      Family History:  Family History  Problem Relation Age of Onset    Colon cancer Father 49   Cancer Father    Cancer Maternal Grandmother    Breast cancer Cousin     Social History:  reports that she quit smoking about 8 years ago. Her smoking use included cigarettes. She started smoking about 28 years ago. She has a 5 pack-year smoking history. She has never used smokeless tobacco. She reports that she does not currently use alcohol after a past usage of about 2.0 - 3.0 standard drinks of alcohol per week. She reports that she does not use drugs.  Allergies:  Allergies  Allergen Reactions   Lipitor [Atorvastatin ] Other (See Comments)    Upset her stomach.    Medications: I have reviewed the patient's current medications.  Labs: Pertinent lab work personally reviewed.  Imaging: Pertinent imaging personally reviewed  Physical Exam Blood pressure (!) (P) 156/82, pulse (!) (P) 58, temperature (P) 98.7 F (37.1 C), temperature source (P) Oral, resp. rate (P) 18, height (P) 5' 5 (1.651 m), weight (P) 68.5 kg, SpO2 (P) 97%. General: No acute distress, well appearing HEENT: PERRL, hearing grossly normal, mucous membranes moist CV: Regular rate and rhythm Pulm: Normal work of breathing on room air Abd: Soft, nontender, nondistended, negative Murphy's sign Extremities: Warm and well perfused Neuro: A&O x4, no focal neurologic deficits Psych: Appropriate mood and effect     Assessment   Shannon [name] is an 68 y.o.  female with cholelithiasis  Plan  - Proceed to OR for laparoscopic cholecystectomy with ICG - We discussed the etiology of patient's pain, we discussed treatment options and recommended surgery. We discussed details of surgery including general anesthesia, laparoscopic approach, identification of cystic duct and common bile duct. Ligation of cystic duct and cystic artery. Possible need for intraoperative cholangiogram, open procedure, and subtotal cholecystectomy. Possible risks of common bile duct injury, injury to surrounding  structures, bile leak, bleeding, infection, diarrhea, retained stone and hernia. The patient showed good understanding and all questions were answered    Shannon Silversmith, MD [facility] Surgery

## 2024-04-06 NOTE — Op Note (Signed)
 04/06/2024 7:20 AM  PATIENT: Shannon [name]  68 y.o. female  Patient Care Team: Vicci Barnie NOVAK, MD as PCP - General (Internal Medicine) Octavia, Charlie Hamilton, MD as Consulting Physician (Ophthalmology)  PRE-OPERATIVE DIAGNOSIS: cholelithiasis  POST-OPERATIVE DIAGNOSIS: cholelithiasis with chronic cholecystitis  PROCEDURE: laparoscopic cholecystectomy with ICG  SURGEON: Orie Silversmith, MD  ASSISTANT: Leonor Dawn, MD  ANESTHESIA: General endotracheal  EBL: 10cc  DRAINS: None  SPECIMEN: Gallbladder  COUNTS: Sponge, needle and instrument counts were reported correct x2 at the conclusion of the operation  DISPOSITION: PACU in satisfactory condition  COMPLICATIONS: None  FINDINGS: Omental adhesions to anterior abdominal wall. Chronically inflamed gallbladder  DESCRIPTION:  Preoperative indocyanine green  was administered in preoperatively. The patient was identified & brought into the operating room. She was then positioned supine on the OR table. SCDs were in place and active during the entire case. She then underwent general endotracheal anesthesia. Pressure points were padded. Hair on the abdomen was clipped by the OR team. The abdomen was prepped and draped in the standard sterile fashion. Antibiotics were administered. A surgical timeout was performed and confirmed our plan.  A small incision was made in the LUQ at Palmer's point and a veress needle was inserted. Air was aspirated and subsequent positive drop test. Abdomen then insufflated to . A periumbilical incision was then made and a 5mm trocar optiview using a 30 degree scope was inserted and the abdomen was entered under direct visualization. Inspection confirmed no evidence of trocar or veress site complications. The veress was then removed.    The patient was then positioned in reverse Trendelenburg with slight left side down. There were omental adhesions to anterior abdominal wall. A 5mm trocar was placed  under direct visualization in right flank.  A 12 mm supxiphoid trocar was placed under direct visualization. The anterior abdominal wall omental adhesions were taken down to help with visualization of the surgical field. An additional 5mm trocars was placed along the right subcostal line along the mid subcostal region.  The liver and gallbladder were inspected. There were several large stones within the gallbladder The gallbladder fundus was grasped and elevated cephalad. An additional grasper was then placed on the infundibulum of the gallbladder and the infundibulum was retracted laterally. Staying high on the gallbladder, the peritoneum on both sides of the gallbladder was opened with hook cautery. Gentle blunt dissection was then employed with a [place]  dissector working down into [employer]. The cystic duct was identified and carefully circumferentially dissected. The cystic artery was also identified and carefully circumferentially dissected. The space between the cystic artery and hepatocystic plate was developed such that a good view of the liver could be seen through a window medial to the cystic artery. The triangle of Calot had been cleared of all fibrofatty tissue. At this point, a critical view of safety was achieved and the only structures visualized was the skeletonized cystic duct laterally, the skeletonized cystic artery and the liver through the window medial to the artery. No posterior cystic artery was noted  Attention turned to infrared fluorescent cholangiography with indocyanine green . There was no fluorescence of any structures with ICG.  The cystic duct and artery were clipped with 2 hemolock clips on the patient side and 1 clip on the specimen side. The cystic duct and artery were then divided. The gallbladder was then freed from its remaining attachments to the liver using electrocautery and placed into an endocatch bag. The RUQ was gently irrigated with sterile saline.  Hemostasis  was then verified. The clips were in good position; the gallbladder fossa was dry. The rest of the abdomen was inspected no injury nor bleeding elsewhere was identified.  The endocatch bag containing the gallbladder was then removed from the subxiphoid port site and passed off as specimen. The subxiphoid port fascia was then closed in a figured of eight fashion with 0 vicryl using a suture passer. The RUQ ports were removed under direct visualization and noted to be hemostatic.SABRA The fascia was palpated and noted to be completely closed. The abdomen was then desufflated and the periumbilical trocar removed. The skin of all incision sites was approximated with 4-0 monocryl subcuticular suture and dermabond applied. She was then awakened from anesthesia, extubated, and transferred to a stretcher for transport to PACU in satisfactory condition.  Instrument, sponge, and needle counts were correct at closure and at the conclusion of the case.   Orie Silversmith, MD [facility] Surgery

## 2024-04-07 ENCOUNTER — Encounter (HOSPITAL_COMMUNITY): Payer: Self-pay | Admitting: General Surgery

## 2024-04-07 LAB — SURGICAL PATHOLOGY

## 2024-04-08 ENCOUNTER — Ambulatory Visit: Payer: Self-pay | Admitting: Internal Medicine

## 2024-05-06 ENCOUNTER — Other Ambulatory Visit: Payer: Self-pay | Admitting: Internal Medicine

## 2024-05-06 DIAGNOSIS — Z0000 Encounter for general adult medical examination without abnormal findings: Secondary | ICD-10-CM

## 2024-05-06 DIAGNOSIS — I10 Essential (primary) hypertension: Secondary | ICD-10-CM

## 2024-05-11 DIAGNOSIS — H43812 Vitreous degeneration, left eye: Secondary | ICD-10-CM | POA: Diagnosis not present

## 2024-05-11 DIAGNOSIS — H2513 Age-related nuclear cataract, bilateral: Secondary | ICD-10-CM | POA: Diagnosis not present

## 2024-05-24 ENCOUNTER — Other Ambulatory Visit: Payer: Self-pay | Admitting: Internal Medicine

## 2024-05-24 DIAGNOSIS — I10 Essential (primary) hypertension: Secondary | ICD-10-CM

## 2024-05-26 NOTE — Telephone Encounter (Signed)
 Requested Prescriptions  Pending Prescriptions Disp Refills   hydrochlorothiazide  (HYDRODIURIL ) 25 MG tablet [Pharmacy Med Name: HYDROCHLOROTHIAZIDE  25 MG TAB] 90 tablet 1    Sig: TAKE 1 TABLET (25 MG TOTAL) BY MOUTH DAILY.     Cardiovascular: Diuretics - Thiazide Passed - 05/26/2024 12:08 PM      Passed - Cr in normal range and within 180 days    Creat  Date Value Ref Range Status  10/12/2015 0.69 0.50 - 1.05 mg/dL Final   Creatinine, Ser  Date Value Ref Range Status  03/09/2024 0.75 0.57 - 1.00 mg/dL Final         Passed - K in normal range and within 180 days    Potassium  Date Value Ref Range Status  03/09/2024 4.3 3.5 - 5.2 mmol/L Final         Passed - Na in normal range and within 180 days    Sodium  Date Value Ref Range Status  03/09/2024 143 134 - 144 mmol/L Final         Passed - Last BP in normal range    BP Readings from Last 1 Encounters:  04/06/24 137/65         Passed - Valid encounter within last 6 months    Recent Outpatient Visits           2 months ago Essential hypertension   [facility] [facility] [place] [facility] [place] Vicci Barnie NOVAK, MD   4 months ago Hyperthyroidism   [place] [facility] [place] [facility] [place] Vicci Barnie NOVAK, MD   4 months ago Abnormal thyroid  blood test   [facility] [place]. [facility]   4 months ago Nausea and vomiting, unspecified vomiting type   [place] [facility] [place] [facility] [place]. [facility] Delbert Clam, MD   7 months ago Essential hypertension   [place] [facility] [place] [facility] Of [place]. [facility] Vicci Barnie NOVAK, MD       Future Appointments             In 1 month Vicci Barnie NOVAK, MD [facility] [place] - [facility] Dept Of [place]. [facility] [place]

## 2024-06-01 ENCOUNTER — Ambulatory Visit
Admission: RE | Admit: 2024-06-01 | Discharge: 2024-06-01 | Disposition: A | Discharge status: Home / Self Care | Source: Ambulatory Visit | Attending: Internal Medicine | Admitting: Internal Medicine

## 2024-06-01 DIAGNOSIS — Z0000 Encounter for general adult medical examination without abnormal findings: Secondary | ICD-10-CM

## 2024-06-03 ENCOUNTER — Ambulatory Visit: Payer: Self-pay | Admitting: Internal Medicine

## 2024-06-04 ENCOUNTER — Other Ambulatory Visit: Payer: Self-pay | Admitting: Internal Medicine

## 2024-06-04 DIAGNOSIS — R928 Other abnormal and inconclusive findings on diagnostic imaging of breast: Secondary | ICD-10-CM

## 2024-06-08 ENCOUNTER — Ambulatory Visit
Admission: RE | Admit: 2024-06-08 | Discharge: 2024-06-08 | Disposition: A | Discharge status: Home / Self Care | Source: Ambulatory Visit | Attending: Internal Medicine | Admitting: Internal Medicine

## 2024-06-08 ENCOUNTER — Other Ambulatory Visit: Payer: Self-pay | Admitting: Internal Medicine

## 2024-06-08 ENCOUNTER — Ambulatory Visit: Admission: RE | Admit: 2024-06-08 | Source: Ambulatory Visit

## 2024-06-08 ENCOUNTER — Ambulatory Visit: Payer: Self-pay | Admitting: Internal Medicine

## 2024-06-08 DIAGNOSIS — R928 Other abnormal and inconclusive findings on diagnostic imaging of breast: Secondary | ICD-10-CM

## 2024-06-29 ENCOUNTER — Ambulatory Visit: Attending: Internal Medicine | Admitting: Internal Medicine

## 2024-06-29 ENCOUNTER — Encounter: Payer: Self-pay | Admitting: Internal Medicine

## 2024-06-29 ENCOUNTER — Other Ambulatory Visit (HOSPITAL_COMMUNITY)
Admission: RE | Admit: 2024-06-29 | Discharge: 2024-06-29 | Disposition: A | Source: Ambulatory Visit | Attending: Internal Medicine | Admitting: Internal Medicine

## 2024-06-29 VITALS — BP 151/78 | HR 59 | Temp 97.8°F | Ht 65.0 in | Wt 167.1 lb

## 2024-06-29 DIAGNOSIS — Z2821 Immunization not carried out because of patient refusal: Secondary | ICD-10-CM

## 2024-06-29 DIAGNOSIS — I10 Essential (primary) hypertension: Secondary | ICD-10-CM

## 2024-06-29 DIAGNOSIS — L299 Pruritus, unspecified: Secondary | ICD-10-CM

## 2024-06-29 DIAGNOSIS — R1012 Left upper quadrant pain: Secondary | ICD-10-CM

## 2024-06-29 DIAGNOSIS — H6123 Impacted cerumen, bilateral: Secondary | ICD-10-CM

## 2024-06-29 DIAGNOSIS — G8929 Other chronic pain: Secondary | ICD-10-CM

## 2024-06-29 DIAGNOSIS — N898 Other specified noninflammatory disorders of vagina: Secondary | ICD-10-CM

## 2024-06-29 DIAGNOSIS — E0590 Thyrotoxicosis, unspecified without thyrotoxic crisis or storm: Secondary | ICD-10-CM

## 2024-06-29 MED ORDER — HYDROCORTISONE-ACETIC ACID 1-2 % OT SOLN: 2.0000 [drp] | Freq: Two times a day (BID) | OTIC | 0 refills | Status: AC | PRN

## 2024-06-29 NOTE — Patient Instructions (Signed)
  VISIT SUMMARY: Today, you were seen for abdominal pain, right ankle pain, and vaginal odor. We discussed your symptoms and reviewed your current medications. A plan was made to address each of your concerns, including ordering tests and providing recommendations for symptom management.  YOUR PLAN: -LEFT UPPER QUADRANT ABDOMINAL PAIN: I do not detect a hernia on exam today. We have ordered a CT scan of your abdomen with contrast to get a clearer picture and a chemistry panel to check your kidney function before the scan.  -CHRONIC RIGHT ANKLE PAIN: Your ankle pain is likely due to the previous injury. Continue using compression socks and wraps for support as they provide relief.  -VAGINAL ODOR: The odor may be due to a mild bacterial infection. We performed a vaginal swab to check for yeast and bacterial vaginosis.  -ESSENTIAL HYPERTENSION: Your high blood pressure may be due to missing a dose of your medication. Please ensure you take your blood pressure medication as prescribed.  -IMPACTED CERUMEN, BILATERAL: You have earwax buildup in both ears causing discomfort. We have referred you to an ENT specialist for removal.  -PRURITUS OF EAR: The itching in your ears can be managed with the hydrocortisone  and acetic acid  solution you are currently using. Continue using it as needed.  -HYPERTHYROIDISM: Your thyroid  condition is being managed with methimazole . You have an upcoming appointment with your endocrinologist to assess how well the medication is working.  INSTRUCTIONS: Please follow up with the endocrinologist in two weeks to assess your thyroid  medication. Additionally, attend the ENT specialist appointment for earwax removal. Ensure you take your blood pressure medication as prescribed. We will contact you with the results of your CT scan and vaginal swab once they are available.                      Contains text generated by [employer].                                  Contains text generated by [employer]bridge.

## 2024-06-29 NOTE — Progress Notes (Signed)
 Patient ID: Shannon [name], female    DOB: Aug 06, 1955  MRN: [id number]  CC: Hernia (Possible hernia on LUQ- requesting x-ray/Fall 2 yrs ago & injured R ankle, possibly fractured at that time - still causing discomfort /On-going vaginal odor, irritation/No to flu vax)   Subjective: Shannon [name] is a 68 y.o. female who presents for chronic ds management. Her concerns today include:  Pt with hx of HTN, former smoker, HL(Lipitor upset stomach; decline other statin), IBS (on Celexa  for this with overlay of anxiety), GERD, seasonal allergies.   Discussed the use of AI scribe software for clinical note transcription with the patient, who gave verbal consent to proceed.  History of Present Illness Shannon [name] is a 68 year old female who presents with abdominal pain, right ankle pain, and vaginal odor.  Approximately six weeks after gallbladder removal surgery in September, she began experiencing a sensation of a 'knot' in her left upper abdomen, right below the rib cage. This was accompanied by a dull pain, initially rated as 8-9 out of 10, which has since decreased to 3-5 out of 10 with self-massage using cocoa butter. The pain is exacerbated by sitting for long periods and leaning over, but improves when standing. She experiences occasional nausea but no vomiting, diarrhea, or constipation. She is concerned about the possibility of a hernia and has considered whether gas may be contributing to her symptoms, based on her own research.  She has been experiencing right ankle pain for two years following a severe sprain while working on her deck. She thinks she may have fractured at the time but never got x-ray done. The pain is described as a sharp, shooting sensation on the lateral side of the foot from mid foot towards the heel, occurring occasionally. She uses compression socks and an ankle wrap, which provide some relief, but the pain persists intermittently. No swelling in the right  ankle.  She reports a persistent vaginal odor without discharge or itching. She has a history of bacterial infections but has not had sexual activity in five years. She reports multiple tests for infections have returned negative results. I see she last had check with vaginal swab 06/2023. She uses [employer] body wash for dry skin and does not douche.  She is currently on methimazole  once daily for thyroid  management, having switched back from propylthiouracil due to adverse effects of causing her hair to knot up and fall out. Has f/u appt with [facility] in 2 wks.  She also takes hydrochlorothiazide  and Valsartan  for blood pressure and has not taken them as yet for the morning.   She occasionally uses a hydrocortisone /acetic acid  solution for ear irritation and request RF.    Patient Active Problem List   Diagnosis Date Noted   Abnormal TSH 03/27/2024   History of genital warts 04/04/2021   Mixed hyperlipidemia 06/29/2019   Screening breast examination 01/27/2019   Seasonal allergies 11/11/2017   Plantar fascial fibromatosis of both feet 05/20/2017   Irritable bowel syndrome without diarrhea 03/11/2017   Gastroesophageal reflux disease without esophagitis 03/11/2017   Postmenopausal postcoital bleeding 08/27/2016   HTN (hypertension), benign 04/02/2016   Vitamin D  deficiency 10/10/2015   Chronic knee pain 10/11/2014   Eczema 10/11/2014   History of right knee surgery 10/11/2014     Current Outpatient Medications on File Prior to Visit  Medication Sig Dispense Refill   Ascorbic Acid (VITAMIN C) 1000 MG tablet Take 2,000 mg by mouth daily.  Black Currant Seed Oil 500 MG CAPS Take 500 mg by mouth daily as needed (Pain).     Cholecalciferol (VITAMIN D3) 50 MCG (2000 UT) capsule Take 2,000 Units by mouth daily.     Cod Liver Oil CAPS Take 2 capsules by mouth in the morning.     esomeprazole (NEXIUM) 20 MG capsule Take 20 mg by mouth daily as needed (Heartburn).     famotidine   (PEPCID ) 20 MG tablet TAKE 1 TABLET BY MOUTH TWICE A DAY (Patient taking differently: Take 20 mg by mouth daily as needed for heartburn.) 180 tablet 1   fluticasone  (FLONASE ) 50 MCG/ACT nasal spray SPRAY 2 SPRAYS INTO EACH NOSTRIL EVERY DAY (Patient taking differently: Place 1 spray into both nostrils daily as needed for allergies or rhinitis.) 48 mL 2   hydrochlorothiazide  (HYDRODIURIL ) 25 MG tablet TAKE 1 TABLET (25 MG TOTAL) BY MOUTH DAILY. 90 tablet 1   ondansetron  (ZOFRAN -ODT) 4 MG disintegrating tablet DISSOLVE 1 TABLET BY MOUTH DAILY AS NEEDED FOR NAUSEA 20 tablet 0   OVER THE COUNTER MEDICATION Take 1 capsule by mouth daily. Napoil Cactus     oxyCODONE  (ROXICODONE ) 5 MG immediate release tablet Take 1 tablet (5 mg total) by mouth every 4 (four) hours as needed. 15 tablet 0   propylthiouracil (PTU) 50 MG tablet Take 50 mg by mouth daily.     valsartan  (DIOVAN ) 40 MG tablet TAKE 1 TABLET BY MOUTH EVERY DAY 90 tablet 1   vitamin E 180 MG (400 UNITS) capsule Take 400 Units by mouth daily.     No current facility-administered medications on file prior to visit.    Allergies  Allergen Reactions   Lipitor [Atorvastatin ] Other (See Comments)    Upset her stomach.    Social History   Socioeconomic History   Marital status: Single    Spouse name: Not on file   Number of children: 2   Years of education: Not on file   Highest education level: Some college, no degree  Occupational History   Not on file  Tobacco Use   Smoking status: Former    Current packs/day: 0.00    Average packs/day: 0.3 packs/day for 20.0 years (5.0 ttl pk-yrs)    Types: Cigarettes    Start date: 08/01/1995    Quit date: 08/01/2015    Years since quitting: 8.9   Smokeless tobacco: Never  Vaping Use   Vaping status: Never Used  Substance and Sexual Activity   Alcohol use: Not Currently    Alcohol/week: 2.0 - 3.0 standard drinks of alcohol    Types: 2 - 3 Cans of beer per week    Comment: 2-3 drinks beer  weekend   Drug use: No   Sexual activity: Not Currently    Birth control/protection: Surgical  Other Topics Concern   Not on file  Social History Narrative   Not on file   Social Drivers of Health   Financial Resource Strain: Medium Risk (12/09/2023)   Overall Financial Resource Strain (CARDIA)    Difficulty of Paying Living Expenses: Somewhat hard  Food Insecurity: Food Insecurity Present (12/09/2023)   Hunger Vital Sign    Worried About Running Out of Food in the Last Year: Sometimes true    Ran Out of Food in the Last Year: Never true  Transportation Needs: No Transportation Needs (12/09/2023)   PRAPARE - Administrator, Civil Service (Medical): No    Lack of Transportation (Non-Medical): No  Physical Activity: Sufficiently Active (12/09/2023)  Exercise Vital Sign    Days of Exercise per Week: 5 days    Minutes of Exercise per Session: 30 min  Stress: No Stress Concern Present (12/09/2023)   [employer] of Occupational Health - Occupational Stress Questionnaire    Feeling of Stress : Only a little  Social Connections: Socially Isolated (12/09/2023)   Social Connection and Isolation Panel    Frequency of Communication with Friends and Family: Once a week    Frequency of Social Gatherings with Friends and Family: Once a week    Attends Religious Services: Never    Database Administrator or Organizations: No    Attends Banker Meetings: Never    Marital Status: Divorced  Catering Manager Violence: Not At Risk (12/09/2023)   Humiliation, Afraid, Rape, and Kick questionnaire    Fear of Current or Ex-Partner: No    Emotionally Abused: No    Physically Abused: No    Sexually Abused: No    Family History  Problem Relation Age of Onset   Colon cancer Father 60   Cancer Father    Cancer Maternal Grandmother    Breast cancer Cousin     Past Surgical History:  Procedure Laterality Date   ABDOMINAL HYSTERECTOMY     ANTERIOR CRUCIATE LIGAMENT  REPAIR  1995   BREAST EXCISIONAL BIOPSY Bilateral    benign   CESAREAN SECTION     x2   CHOLECYSTECTOMY N/A 04/06/2024   Procedure: LAPAROSCOPIC CHOLECYSTECTOMY;  Surgeon: Ann Fine, MD;  Location: [facility] OR;  Service: General;  Laterality: N/A;   COLONOSCOPY  09/05/2015   Dr.Jacobs   COLONOSCOPY  05/2010   PARTIAL HYSTERECTOMY     TAH   POLYPECTOMY      ROS: Review of Systems Negative except as stated above  PHYSICAL EXAM: BP (!) 150/81 (BP Location: Left Arm, Patient Position: Sitting, Cuff Size: Normal)   Pulse (!) 59   Temp 97.8 F (36.6 C) (Oral)   Ht 5' 5 (1.651 m)   Wt 167 lb 1.6 oz (75.8 kg)   SpO2 97%   BMI 27.81 kg/m   Physical Exam  General appearance - alert, well appearing, and in no distress Mental status - normal mood, behavior, speech, dress, motor activity, and thought processes Ears: Skin to the opening of the ear canal dry with mild peeling of the skin.  She has hard cerumen impaction bilaterally. Chest - clear to auscultation, no wheezes, rales or rhonchi, symmetric air entry Heart - normal rate, regular rhythm, normal S1, S2, no murmurs, rubs, clicks or gallops Abdomen - soft, nontender, nondistended, no masses or organomegaly Musculoskeletal - RT foot: No edema or erythema noted.  Good passive range of motion of the ankle.  No tenderness on palpation along the fifth MT Extremities - peripheral pulses normal, no pedal edema, no clubbing or cyanosis      Latest Ref Rng & Units 03/09/2024    2:55 PM 02/11/2023    4:20 PM 12/04/2021    2:27 PM  CMP  Glucose 70 - 99 mg/dL 95  880  80   BUN 8 - 27 mg/dL 12  11  9    Creatinine 0.57 - 1.00 mg/dL 9.24  9.20  9.25   Sodium 134 - 144 mmol/L 143  141  144   Potassium 3.5 - 5.2 mmol/L 4.3  3.6  4.1   Chloride 96 - 106 mmol/L 103  100  104   CO2 20 - 29 mmol/L 27  25  26   Calcium  8.7 - 10.3 mg/dL 89.8  9.8  9.8   Total Protein 6.0 - 8.5 g/dL 6.6  6.5  6.9   Total Bilirubin 0.0 - 1.2 mg/dL 0.4  0.3  0.3    Alkaline Phos 44 - 121 IU/L 119  89  77   AST 0 - 40 IU/L 22  19  17    ALT 0 - 32 IU/L 15  12  18     Lipid Panel     Component Value Date/Time   CHOL 183 03/09/2024 1455   TRIG 107 03/09/2024 1455   HDL 57 03/09/2024 1455   CHOLHDL 3.2 03/09/2024 1455   CHOLHDL 3.7 Ratio 09/10/2008 2037   VLDL 14 09/10/2008 2037   LDLCALC 107 (H) 03/09/2024 1455    CBC    Component Value Date/Time   WBC 7.1 03/09/2024 1455   WBC 4.9 10/12/2015 0931   RBC 4.76 03/09/2024 1455   RBC 4.67 10/12/2015 0931   HGB 13.1 03/09/2024 1455   HCT 41.0 03/09/2024 1455   PLT 217 03/09/2024 1455   MCV 86 03/09/2024 1455   MCH 27.5 03/09/2024 1455   MCH 28.5 10/12/2015 0931   MCHC 32.0 03/09/2024 1455   MCHC 33.8 10/12/2015 0931   RDW 14.7 03/09/2024 1455   LYMPHSABS 1.9 10/12/2015 0931   MONOABS 0.6 10/12/2015 0931   EOSABS 0.1 10/12/2015 0931   BASOSABS 0.0 10/12/2015 0931    ASSESSMENT AND PLAN: 1. Left upper quadrant abdominal pain (Primary) Exam today is benign and I do not detect a hernia which she was concerned about.  Since the pain has been persistent for several weeks, we will move forward with getting a CAT scan of the abdomen. - CT ABDOMEN PELVIS W CONTRAST; Future  2. Essential hypertension Not at goal.  She has not taken her medicines as yet for today.  She will take them when she returns home.  Continue HCTZ 25 mg daily and Diovan  40 mg daily - Basic Metabolic Panel - Microalbumin / creatinine urine ratio  3. Chronic pain of right ankle Exam today is benign. Advised patient that even if she had sustained a fracture 2 years ago when she had a severe sprain, fracture would have healed by now.  I recommend that she continue to use the compressive socks which she finds helpful.  4. Vaginal odor Advised not to douche which she states she does not do.  We will check vaginal self swab for yeast and bacterial vaginosis. - Cervicovaginal ancillary only  5. Impacted cerumen of both  ears Recommend referral to ENT to have wax suctioned out of the ears.  She is agreeable to this. - Ambulatory referral to ENT  6. Itching of ear - acetic acid -hydrocortisone  (VOSOL -HC) OTIC solution; Place 2 drops into both ears 2 (two) times daily as needed.  Dispense: 10 mL; Refill: 0  7. Hyperthyroidism I have updated her medication list.  Keep upcoming appointment with endocrinology in 2 weeks.  8. Influenza vaccination declined    Patient was given the opportunity to ask questions.  Patient verbalized understanding of the plan and was able to repeat key elements of the plan.   This documentation was completed using Paediatric nurse.  Any transcriptional errors are unintentional.  No orders of the defined types were placed in this encounter.    Requested Prescriptions    No prescriptions requested or ordered in this encounter    No follow-ups on file.  Barnie Louder, MD,  FACP

## 2024-06-30 ENCOUNTER — Ambulatory Visit: Payer: Self-pay | Admitting: Internal Medicine

## 2024-06-30 ENCOUNTER — Telehealth: Payer: Self-pay | Admitting: Internal Medicine

## 2024-06-30 LAB — CERVICOVAGINAL ANCILLARY ONLY
Bacterial Vaginitis (gardnerella): NEGATIVE
Candida Glabrata: NEGATIVE
Candida Vaginitis: NEGATIVE
Comment: NEGATIVE
Comment: NEGATIVE
Comment: NEGATIVE

## 2024-06-30 NOTE — Telephone Encounter (Signed)
 Copied from CRM [id number]. Topic: Clinical - Lab/Test Results >> Jun 30, 2024  4:13 PM Burnard DEL wrote:  Reason for CRM: Patient would like to know if her results from her vaginal swab has came back yet?She stated that she has received all of her other results from when she was seen yesterday but just not that one and was wondering has it came back yet?

## 2024-07-01 LAB — MICROALBUMIN / CREATININE URINE RATIO
Creatinine, Urine: 530.3 mg/dL
Microalb/Creat Ratio: 7 mg/g{creat} (ref 0–29)
Microalbumin, Urine: 35.6 ug/mL

## 2024-07-01 LAB — BASIC METABOLIC PANEL WITH GFR
BUN/Creatinine Ratio: 14 (ref 12–28)
BUN: 13 mg/dL (ref 8–27)
CO2: 27 mmol/L (ref 20–29)
Calcium: 10 mg/dL (ref 8.7–10.3)
Chloride: 99 mmol/L (ref 96–106)
Creatinine, Ser: 0.9 mg/dL (ref 0.57–1.00)
Glucose: 91 mg/dL (ref 70–99)
Potassium: 3.8 mmol/L (ref 3.5–5.2)
Sodium: 139 mmol/L (ref 134–144)
eGFR: 70 mL/min/1.73 (ref >59)

## 2024-07-02 NOTE — Telephone Encounter (Signed)
 Patient has seen results & provider message via Mychart. Please see details from lab results below.   Vaginal smear was negative for yeast and bacterial vaginosis.  Written by Barnie KATHEE Louder, MD on 07/01/2024  8:23 AM EST Seen by patient Winton Brought Barthold on 07/01/2024  7:04 PM

## 2024-07-06 ENCOUNTER — Inpatient Hospital Stay: Admission: RE | Admit: 2024-07-06 | Discharge: 2024-07-06 | Attending: Internal Medicine | Admitting: Internal Medicine

## 2024-07-06 DIAGNOSIS — R1012 Left upper quadrant pain: Secondary | ICD-10-CM

## 2024-07-06 MED ORDER — IOPAMIDOL (ISOVUE-300) INJECTION 61%
100.0000 mL | Freq: Once | INTRAVENOUS | Status: AC | PRN
  Administered 2024-07-06: 14:00:00 100 mL via INTRAVENOUS

## 2024-07-13 ENCOUNTER — Encounter (INDEPENDENT_AMBULATORY_CARE_PROVIDER_SITE_OTHER): Payer: Self-pay | Admitting: Physician Assistant

## 2024-07-13 ENCOUNTER — Ambulatory Visit (INDEPENDENT_AMBULATORY_CARE_PROVIDER_SITE_OTHER): Admitting: Physician Assistant

## 2024-07-13 VITALS — BP 159/73 | HR 52 | Temp 98.0°F | Ht 65.0 in | Wt 163.0 lb

## 2024-07-13 DIAGNOSIS — H608X3 Other otitis externa, bilateral: Secondary | ICD-10-CM

## 2024-07-13 DIAGNOSIS — H6123 Impacted cerumen, bilateral: Secondary | ICD-10-CM

## 2024-07-13 MED ORDER — HYDROCORTISONE 1 % EX CREA: 1.0000 | TOPICAL_CREAM | Freq: Two times a day (BID) | CUTANEOUS | 0 refills | Status: AC

## 2024-07-13 NOTE — Progress Notes (Unsigned)
 Patient having BP managed. Patient says that she forgot to take her BP medication today.

## 2024-07-14 NOTE — Progress Notes (Signed)
 Dear Dr. Vicci, Here is my assessment for our mutual patient, Shannon [name]. Thank you for allowing me the opportunity to care for your patient. Please do not hesitate to contact me should you have any other questions. Sincerely, Chyrl Cohen PA-C  Otolaryngology Clinic Note Referring provider: Dr. Vicci HPI:  Shannon [name] is a 68 y.o. female kindly referred by Dr. Vicci   Discussed the use of AI scribe software for clinical note transcription with the patient, who gave verbal consent to proceed.  History of Present Illness   Shannon [name] is a 68 year old female with chronic pruritus and cerumen impaction who presents for evaluation of persistent bilateral ear itching and wax buildup.  She reports chronic bilateral pruritus of the external ear canals, described as constant irritation and tenderness involving both the inner and outer aspects of the ears. Symptoms have persisted for many years and are characterized by persistent irritation and marked tenderness. She denies similar symptoms or eczema elsewhere and has no known allergies. She occasionally experiences a sensation of something being stuck in the ear but denies hearing loss.  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  Current management includes topical acetic acid /hydrocortisone  drops and hydrocortisone /acetic acid  combination, which provide partial relief of pruritus when applied primarily to the outer ear. She also uses topical petrolatum, initiated after recent abdominal surgery, and has hydrocortisone  cream available at home. She applies hydrogen peroxide to the ears approximately monthly for cerumen management but continues to have difficulty with wax removal and dryness. She occasionally uses cotton-tipped applicators on the outer ear for  pruritus relief but avoids deep insertion. She showers every few days and sometimes uses cotton in the ears to prevent water exposure.           Independent Review of Additional Tests or Records:  none   PMH/Meds/All/SocHx/FamHx/ROS:   Past Medical History:  Diagnosis Date   Arthritis    BV (bacterial vaginosis)    Hypertension      Past Surgical History:  Procedure Laterality Date   ABDOMINAL HYSTERECTOMY     ANTERIOR CRUCIATE LIGAMENT REPAIR  1995   BREAST EXCISIONAL BIOPSY Bilateral    benign   CESAREAN SECTION     x2   CHOLECYSTECTOMY N/A 04/06/2024   Procedure: LAPAROSCOPIC CHOLECYSTECTOMY;  Surgeon: Ann Fine, MD;  Location: [facility] OR;  Service: General;  Laterality: N/A;   COLONOSCOPY  09/05/2015   Dr.Jacobs   COLONOSCOPY  05/2010   PARTIAL HYSTERECTOMY     TAH   POLYPECTOMY      Family History  Problem Relation Age of Onset   Colon cancer Father 38   Cancer Father    Cancer Maternal Grandmother    Breast cancer Cousin      Social Connections: Socially Isolated (12/09/2023)   Social Connection and Isolation Panel    Frequency of Communication with Friends and Family: Once a week    Frequency of Social Gatherings with Friends and Family: Once a week    Attends Religious Services: Never    Database Administrator or Organizations: No    Attends Banker Meetings: Never    Marital Status: Divorced     Current Medications[1]   Physical Exam:   BP (!) 159/73   Pulse (!) 52   Temp 98 F (36.7 C)  Ht 5' 5 (1.651 m)   Wt 163 lb (73.9 kg)   SpO2 97%   BMI 27.12 kg/m   Pertinent Findings  CN II-XII grossly intact Bilateral cerumen impaction, dry flaky skin noted bilateral and external auditory meatus and concha  Anterior rhinoscopy: Septum midline; bilateral inferior turbinates with no hypertrophy No lesions of oral cavity/oropharynx; dentition within normal limits No obviously palpable neck masses/lymphadenopathy/thyromegaly No  respiratory distress or stridor      Seprately Identifiable Procedures:  Procedure: Bilateral ear microscopy and cerumen removal using microscope (CPT [phone]) - Mod 50 Pre-procedure diagnosis: bilateral cerumen impaction external auditory canals Post-procedure diagnosis: same Indication: bilateral cerumen impaction; given patient's otologic complaints and history as well as for improved and comprehensive examination of external ear and tympanic membrane, bilateral otologic examination using microscope was performed and impacted cerumen removed  Procedure: Patient was placed semi-recumbent. Both ear canals were examined using the microscope with findings above. Cerumen removed from bilateral external auditory canals using suction and currette with improvement in EAC examination and patency. Left: EAC was patent. TM was intact . Middle ear was aerated. Drainage: none Right: EAC was patent. TM was intact . Middle ear was aerated . Drainage: none Patient tolerated the procedure well.   Impression & Plans:  Shannon [name] is a 68 y.o. female with the following   Assessment and Plan    Cerumen impaction and chronic pruritus of external ear canals Chronic bilateral pruritus and cerumen impaction due to narrow ear canals, exacerbated by prior use of hydrogen peroxide and cotton-tipped applicators. No infection or hearing loss. Goal: restore protective barrier, reduce inflammation and pruritus. - Cerumen cleaned from both ear canals in clinic. - Prescribed hydrocortisone  1% cream for external application to the outer ear canal and auricle, twice daily for one week. - Instructed use of acetic acid /hydrocortisone  drops for 1-2 days post-cerumen removal if deeper pruritus persists. - Advised application of petrolatum to the external ear canal and auricle several times daily after completing hydrocortisone  cream, especially in the mornings and after showers. - Recommended avoidance of water exposure in  the ears; instructed use of a cotton ball coated in petrolatum in the ear canal during showers to prevent water entry. - Advised avoidance of cotton-tipped applicators, hydrogen peroxide, and water in the ear canals to prevent further dryness and irritation. - Provided instructions to resume hydrocortisone  cream for up to one week as needed if symptoms recur. - Encouraged follow-up if symptoms persist or worsen.           - f/u PRN   Thank you for allowing me the opportunity to care for your patient. Please do not hesitate to contact me should you have any other questions.  Sincerely, Chyrl Cohen PA-C [facility] Phone: [phone] Fax: [phone]  07/14/2024, 12:25 PM          [1]  Current Outpatient Medications:    hydrocortisone  1 % lotion, Apply 1 Application topically as needed for itching., Disp: , Rfl:    hydrocortisone  cream 1 %, Apply 1 Application topically 2 (two) times daily. Apply to bilateral external ears two times daily for 1 week, Disp: 30 g, Rfl: 0   acetic acid -hydrocortisone  (VOSOL -HC) OTIC solution, Place 2 drops into both ears 2 (two) times daily as needed., Disp: 10 mL, Rfl: 0   Ascorbic Acid (VITAMIN C) 1000 MG tablet, Take 2,000 mg by mouth daily., Disp: , Rfl:    Black Currant Seed Oil 500 MG CAPS, Take 500 mg  by mouth daily as needed (Pain)., Disp: , Rfl:    Cholecalciferol (VITAMIN D3) 50 MCG (2000 UT) capsule, Take 2,000 Units by mouth daily., Disp: , Rfl:    Cod Liver Oil CAPS, Take 2 capsules by mouth in the morning., Disp: , Rfl:    esomeprazole (NEXIUM) 20 MG capsule, Take 20 mg by mouth daily as needed (Heartburn)., Disp: , Rfl:    famotidine  (PEPCID ) 20 MG tablet, TAKE 1 TABLET BY MOUTH TWICE A DAY (Patient taking differently: Take 20 mg by mouth daily as needed for heartburn.), Disp: 180 tablet, Rfl: 1   fluticasone  (FLONASE ) 50 MCG/ACT nasal spray, SPRAY 2 SPRAYS INTO EACH NOSTRIL EVERY DAY (Patient taking differently:  Place 1 spray into both nostrils daily as needed for allergies or rhinitis.), Disp: 48 mL, Rfl: 2   hydrochlorothiazide  (HYDRODIURIL ) 25 MG tablet, TAKE 1 TABLET (25 MG TOTAL) BY MOUTH DAILY., Disp: 90 tablet, Rfl: 1   methimazole  (TAPAZOLE ) 5 MG tablet, Take 5 mg by mouth daily., Disp: , Rfl:    ondansetron  (ZOFRAN -ODT) 4 MG disintegrating tablet, DISSOLVE 1 TABLET BY MOUTH DAILY AS NEEDED FOR NAUSEA, Disp: 20 tablet, Rfl: 0   OVER THE COUNTER MEDICATION, Take 1 capsule by mouth daily. Napoil Cactus, Disp: , Rfl:    valsartan  (DIOVAN ) 40 MG tablet, TAKE 1 TABLET BY MOUTH EVERY DAY, Disp: 90 tablet, Rfl: 1   vitamin E 180 MG (400 UNITS) capsule, Take 400 Units by mouth daily., Disp: , Rfl:

## 2024-07-20 ENCOUNTER — Telehealth: Payer: Self-pay | Admitting: Internal Medicine

## 2024-07-20 ENCOUNTER — Ambulatory Visit: Payer: Self-pay | Admitting: Internal Medicine

## 2024-07-20 NOTE — Telephone Encounter (Signed)
 Copied from CRM #[id number]. Topic: Clinical - Lab/Test Results >> Jul 20, 2024 11:26 AM Tiffany B wrote:  Reason for CRM: Patient inquiring about imaging results, patient was unable to see PCP notes in My Chart.

## 2024-07-22 MED ORDER — OMEPRAZOLE 20 MG PO CPDR: 20.0000 mg | DELAYED_RELEASE_CAPSULE | Freq: Every day | ORAL | 3 refills | Status: AC

## 2024-07-22 NOTE — Telephone Encounter (Signed)
 Called patient and informed of the following results per Dr.Johnson:  The CAT scan of your abdomen showed no acute findings. Incidental findings were a small hernia in the umbilicus (belly button) that contains fatty tissue. I do not think this is causing the pain that you get in the left upper abdomen and can be watched for now. The other finding was a small hiatal hernia. This is where part of the stomach pushes through the diaphragm muscle that separates the abdominal cavity from the chest.  This is where  part of the stomach is pushed into the chest cavity.  This can cause heartburn/reflux symptoms. Let me know if you are having any heartburn symptoms and if so, are you still taking heartburn medication for it.  Patient confirmed that she is experiencing heartburn symptoms and has been for a long time. Please send rx for heartburn. Patient is concerned on what is causing the LUQ pain. She would also like to know if she should be concerned about the cyst in the liver per the results please advise.

## 2024-07-22 NOTE — Addendum Note (Signed)
 Addended by: VICCI SOBER B on: 07/22/2024 11:33 AM   Modules accepted: Orders

## 2024-07-22 NOTE — Telephone Encounter (Signed)
 Noted! Thank you

## 2024-07-22 NOTE — Telephone Encounter (Signed)
 Noted. I have replied to pt via Mychart.

## 2024-08-05 ENCOUNTER — Other Ambulatory Visit: Payer: Self-pay | Admitting: Internal Medicine

## 2024-11-02 ENCOUNTER — Ambulatory Visit: Admitting: Internal Medicine

## 2024-12-15 ENCOUNTER — Encounter

## 2024-12-21 ENCOUNTER — Encounter

## 2025-01-11 ENCOUNTER — Ambulatory Visit (INDEPENDENT_AMBULATORY_CARE_PROVIDER_SITE_OTHER): Admitting: Physician Assistant
